# Patient Record
Sex: Female | Born: 1985 | Race: White | Hispanic: No | Marital: Married | Smoking: Never smoker
Health system: Southern US, Community
[De-identification: ages and names within clinical notes are randomized; demographics above are authoritative.]

## PROBLEM LIST (undated history)

## (undated) ENCOUNTER — Inpatient Hospital Stay: Payer: Self-pay

## (undated) DIAGNOSIS — T7840XA Allergy, unspecified, initial encounter: Secondary | ICD-10-CM

## (undated) DIAGNOSIS — N390 Urinary tract infection, site not specified: Secondary | ICD-10-CM

## (undated) DIAGNOSIS — K219 Gastro-esophageal reflux disease without esophagitis: Secondary | ICD-10-CM

## (undated) DIAGNOSIS — D649 Anemia, unspecified: Secondary | ICD-10-CM

## (undated) HISTORY — DX: Gastro-esophageal reflux disease without esophagitis: K21.9

## (undated) HISTORY — DX: Urinary tract infection, site not specified: N39.0

## (undated) HISTORY — DX: Anemia, unspecified: D64.9

## (undated) HISTORY — DX: Allergy, unspecified, initial encounter: T78.40XA

## (undated) HISTORY — PX: COSMETIC SURGERY: SHX468

## (undated) HISTORY — PX: BREAST SURGERY: SHX581

## (undated) HISTORY — PX: WISDOM TOOTH EXTRACTION: SHX21

---
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
## 2015-01-31 LAB — HM PAP SMEAR: HM Pap smear: NEGATIVE

## 2015-11-09 DIAGNOSIS — N390 Urinary tract infection, site not specified: Secondary | ICD-10-CM

## 2015-11-09 HISTORY — DX: Urinary tract infection, site not specified: N39.0

## 2015-11-09 LAB — CBC AND DIFFERENTIAL
HEMATOCRIT: 36 % (ref 36–46)
HEMOGLOBIN: 12.4 g/dL (ref 12.0–16.0)
Platelets: 147 10*3/uL — AB (ref 150–399)
WBC: 9.4 10^3/mL

## 2015-11-16 ENCOUNTER — Ambulatory Visit (INDEPENDENT_AMBULATORY_CARE_PROVIDER_SITE_OTHER): Payer: Managed Care, Other (non HMO) | Admitting: Obstetrics and Gynecology

## 2015-11-16 VITALS — BP 118/75 | HR 91 | Ht 66.0 in | Wt 153.3 lb

## 2015-11-16 DIAGNOSIS — Z369 Encounter for antenatal screening, unspecified: Secondary | ICD-10-CM

## 2015-11-16 DIAGNOSIS — Z113 Encounter for screening for infections with a predominantly sexual mode of transmission: Secondary | ICD-10-CM

## 2015-11-16 DIAGNOSIS — Z36 Encounter for antenatal screening of mother: Secondary | ICD-10-CM

## 2015-11-16 DIAGNOSIS — Z3490 Encounter for supervision of normal pregnancy, unspecified, unspecified trimester: Secondary | ICD-10-CM

## 2015-11-16 DIAGNOSIS — Z1389 Encounter for screening for other disorder: Secondary | ICD-10-CM

## 2015-11-16 DIAGNOSIS — Z331 Pregnant state, incidental: Secondary | ICD-10-CM

## 2015-11-16 LAB — OB RESULTS CONSOLE VARICELLA ZOSTER ANTIBODY, IGG: VARICELLA IGG: IMMUNE

## 2015-11-16 NOTE — Patient Instructions (Signed)
Pregnancy and Zika Virus Disease Zika virus disease, or Zika, is an illness that can spread to people from mosquitoes that carry the virus. It may also spread from person to person through infected body fluids. Zika first occurred in [place], but recently it has spread to new areas. The virus occurs in tropical climates. The location of Zika continues to change. Most people who become infected with Zika virus do not develop serious illness. However, Zika may cause birth defects in an unborn baby whose mother is infected with the virus. It may also increase the risk of miscarriage. WHAT ARE THE SYMPTOMS OF ZIKA VIRUS DISEASE? In many cases, people who have been infected with Zika virus do not develop any symptoms. If symptoms appear, they usually start about a week after the person is infected. Symptoms are usually mild. They may include:  Fever.  Rash.  Red eyes.  Joint pain. HOW DOES ZIKA VIRUS DISEASE SPREAD? The main way that Zika virus spreads is through the bite of a certain type of mosquito. Unlike most types of mosquitos, which bite only at night, the type of mosquito that carries Zika virus bites both at night and during the day. Zika virus can also spread through sexual contact, through a blood transfusion, and from a mother to her baby before or during birth. Once you have had Zika virus disease, it is unlikely that you will get it again. CAN I PASS ZIKA TO MY BABY DURING PREGNANCY? Yes, Zika can pass from a mother to her baby before or during birth. WHAT PROBLEMS CAN ZIKA CAUSE FOR MY BABY? A woman who is infected with Zika virus while pregnant is at risk of having her baby born with a condition in which the brain or head is smaller than expected (microcephaly). Babies who have microcephaly can have developmental delays, seizures, hearing problems, and vision problems. Having Zika virus disease during pregnancy can also increase the risk of miscarriage. HOW CAN ZIKA VIRUS DISEASE BE  PREVENTED? There is no vaccine to prevent Zika. The best way to prevent the disease is to avoid infected mosquitoes and avoid exposure to body fluids that can spread the virus. Avoid any possible exposure to Zika by taking the following precautions. For women and their sex partners:  Avoid traveling to high-risk areas. The locations where Zika is being reported change often. To identify high-risk areas, check the CDC travel website: www.cdc.gov/zika/geo/index.html  If you or your sex partner must travel to a high-risk area, talk with a health care provider before and after traveling.  Take all precautions to avoid mosquito bites if you live in, or travel to, any of the high-risk areas. Insect repellents are safe to use during pregnancy.  Ask your health care provider when it is safe to have sexual contact. For women:  If you are pregnant or trying to become pregnant, avoid sexual contact with persons who may have been exposed to Zika virus, persons who have possible symptoms of Zika, or persons whose history you are unsure about. If you choose to have sexual contact with someone who may have been exposed to Zika virus, use condoms correctly during the entire duration of sexual activity, every time. Do not share sexual devices, as you may be exposed to body fluids.  Ask your health care provider about when it is safe to attempt pregnancy after a possible exposure to Zika virus. WHAT STEPS SHOULD I TAKE TO AVOID MOSQUITO BITES? Take these steps to avoid mosquito bites when you are   in a high-risk area:  Wear loose clothing that covers your arms and legs.  Limit your outdoor activities.  Do not open windows unless they have window screens.  Sleep under mosquito nets.  Use insect repellent. The best insect repellents have:  DEET, picaridin, oil of lemon eucalyptus (OLE), or [id number] in them.  Higher amounts of an active ingredient in them.  Remember that insect repellents are safe to use  during pregnancy.  Do not use OLE on children who are younger than 3 years of age. Do not use insect repellent on babies who are younger than 2 months of age.  Cover your child's stroller with mosquito netting. Make sure the netting fits snugly and that any loose netting does not cover your child's mouth or nose. Do not use a blanket as a mosquito-protection cover.  Do not apply insect repellent underneath clothing.  If you are using sunscreen, apply the sunscreen before applying the insect repellent.  Treat clothing with permethrin. Do not apply permethrin directly to your skin. Follow label directions for safe use.  Get rid of standing water, where mosquitoes may reproduce. Standing water is often found in items such as buckets, bowls, animal food dishes, and flowerpots. When you return from traveling to any high-risk area, continue taking actions to protect yourself against mosquito bites for 3 weeks, even if you show no signs of illness. This will prevent spreading Zika virus to uninfected mosquitoes. WHAT SHOULD I KNOW ABOUT THE SEXUAL TRANSMISSION OF ZIKA? People can spread Zika to their sexual partners during vaginal, anal, or oral sex, or by sharing sexual devices. Many people with Zika do not develop symptoms, so a person could spread the disease without knowing that they are infected. The greatest risk is to women who are pregnant or who may become pregnant. Zika virus can live longer in semen than it can live in blood. Couples can prevent sexual transmission of the virus by:  Using condoms correctly during the entire duration of sexual activity, every time. This includes vaginal, anal, and oral sex.  Not sharing sexual devices. Sharing increases your risk of being exposed to body fluid from another person.  Avoiding all sexual activity until your health care provider says it is safe. SHOULD I BE TESTED FOR ZIKA VIRUS? A sample of your blood can be tested for Zika virus. A pregnant  woman should be tested if she may have been exposed to the virus or if she has symptoms of Zika. She may also have additional tests done during her pregnancy, such ultrasound testing. Talk with your health care provider about which tests are recommended.   This information is not intended to replace advice given to you by your health care provider. Make sure you discuss any questions you have with your health care provider.   Document Released: 01/07/2015 Document Reviewed: 12/31/2014 Elsevier Interactive Patient Education 2016 Elsevier Inc. Minor Illnesses and Medications in Pregnancy  Cold/Flu:  Sudafed for congestion- Robitussin (plain) for cough- Tylenol for discomfort.  Please follow the directions on the label.  Try not to take any more than needed.  OTC Saline nasal spray and air humidifier or cool-mist  Vaporizer to sooth nasal irritation and to loosen congestion.  It is also important to increase intake of non carbonated fluids, especially if you have a fever.  Constipation:  Colace-2 capsules at bedtime; Metamucil- follow directions on label; Senokot- 1 tablet at bedtime.  Any one of these medications can be used.  It is also   very important to increase fluids and fruits along with regular exercise.  If problem persists please call the office.  Diarrhea:  Kaopectate as directed on the label.  Eat a bland diet and increase fluids.  Avoid highly seasoned foods.  Headache:  Tylenol 1 or 2 tablets every 3-4 hours as needed  Indigestion:  Maalox, Mylanta, Tums or Rolaids- as directed on label.  Also try to eat small meals and avoid fatty, greasy or spicy foods.  Nausea with or without Vomiting:  Nausea in pregnancy is caused by increased levels of hormones in the body which influence the digestive system and cause irritation when stomach acids accumulate.  Symptoms usually subside after 1st trimester of pregnancy.  Try the following:  Keep saltines, graham crackers or dry toast by your bed  to eat upon awakening.  Don't let your stomach get empty.  Try to eat 5-6 small meals per day instead of 3 large ones.  Avoid greasy fatty or highly seasoned foods.   Take OTC Unisom 1 tablet at bed time along with OTC Vitamin B6 25-50 mg 3 times per day.    If nausea continues with vomiting and you are unable to keep down food and fluids you may need a prescription medication.  Please notify your provider.   Sore throat:  Chloraseptic spray, throat lozenges and or plain Tylenol.  Vaginal Yeast Infection:  OTC Monistat for 7 days as directed on label.  If symptoms do not resolve within a week notify provider.  If any of the above problems do not subside with recommended treatment please call the office for further assistance.   Do not take Aspirin, Advil, Motrin or Ibuprofen.  * * OTC= Over the counter Hyperemesis Gravidarum Hyperemesis gravidarum is a severe form of nausea and vomiting that happens during pregnancy. Hyperemesis is worse than morning sickness. It may cause you to have nausea or vomiting all day for many days. It may keep you from eating and drinking enough food and liquids. Hyperemesis usually occurs during the first half (the first 20 weeks) of pregnancy. It often goes away once a woman is in her second half of pregnancy. However, sometimes hyperemesis continues through an entire pregnancy.  CAUSES  The cause of this condition is not completely known but is thought to be related to changes in the body's hormones when pregnant. It could be from the high level of the pregnancy hormone or an increase in estrogen in the body.  SIGNS AND SYMPTOMS   Severe nausea and vomiting.  Nausea that does not go away.  Vomiting that does not allow you to keep any food down.  Weight loss and body fluid loss (dehydration).  Having no desire to eat or not liking food you have previously enjoyed. DIAGNOSIS  Your health care provider will do a physical exam and ask you about your symptoms.  He or she may also order blood tests and urine tests to make sure something else is not causing the problem.  TREATMENT  You may only need medicine to control the problem. If medicines do not control the nausea and vomiting, you will be treated in the hospital to prevent dehydration, increased acid in the blood (acidosis), weight loss, and changes in the electrolytes in your body that may harm the unborn baby (fetus). You may need IV fluids.  HOME CARE INSTRUCTIONS   Only take over-the-counter or prescription medicines as directed by your health care provider.  Try eating a couple of dry crackers or   toast in the morning before getting out of bed.  Avoid foods and smells that upset your stomach.  Avoid fatty and spicy foods.  Eat 5-6 small meals a day.  Do not drink when eating meals. Drink between meals.  For snacks, eat high-protein foods, such as cheese.  Eat or suck on things that have ginger in them. Ginger helps nausea.  Avoid food preparation. The smell of food can spoil your appetite.  Avoid iron pills and iron in your multivitamins until after 3-4 months of being pregnant. However, consult with your health care provider before stopping any prescribed iron pills. SEEK MEDICAL CARE IF:   Your abdominal pain increases.  You have a severe headache.  You have vision problems.  You are losing weight. SEEK IMMEDIATE MEDICAL CARE IF:   You are unable to keep fluids down.  You vomit blood.  You have constant nausea and vomiting.  You have excessive weakness.  You have extreme thirst.  You have dizziness or fainting.  You have a fever or persistent symptoms for more than 2-3 days.  You have a fever and your symptoms suddenly get worse. MAKE SURE YOU:   Understand these instructions.  Will watch your condition.  Will get help right away if you are not doing well or get worse.   This information is not intended to replace advice given to you by your health care  provider. Make sure you discuss any questions you have with your health care provider.   Document Released: 04/18/2005 Document Revised: 02/06/2013 Document Reviewed: 11/28/2012 Elsevier Interactive Patient Education 2016 Elsevier Inc. Commonly Asked Questions During Pregnancy  Cats: A parasite can be excreted in cat feces.  To avoid exposure you need to have another person empty the little box.  If you must empty the litter box you will need to wear gloves.  Wash your hands after handling your cat.  This parasite can also be found in raw or undercooked meat so this should also be avoided.  Colds, Sore Throats, Flu: Please check your medication sheet to see what you can take for symptoms.  If your symptoms are unrelieved by these medications please call the office.  Dental Work: Most any dental work your dentist recommends is permitted.  X-rays should only be taken during the first trimester if absolutely necessary.  Your abdomen should be shielded with a lead apron during all x-rays.  Please notify your provider prior to receiving any x-rays.  Novocaine is fine; gas is not recommended.  If your dentist requires a note from us prior to dental work please call the office and we will provide one for you.  Exercise: Exercise is an important part of staying healthy during your pregnancy.  You may continue most exercises you were accustomed to prior to pregnancy.  Later in your pregnancy you will most likely notice you have difficulty with activities requiring balance like riding a bicycle.  It is important that you listen to your body and avoid activities that put you at a higher risk of falling.  Adequate rest and staying well hydrated are a must!  If you have questions about the safety of specific activities ask your provider.    Exposure to Children with illness: Try to avoid obvious exposure; report any symptoms to us when noted,  If you have chicken pos, red measles or mumps, you should be immune to  these diseases.   Please do not take any vaccines while pregnant unless you have checked with   your OB provider.  Fetal Movement: After 28 weeks we recommend you do "kick counts" twice daily.  Lie or sit down in a calm quiet environment and count your baby movements "kicks".  You should feel your baby at least 10 times per hour.  If you have not felt 10 kicks within the first hour get up, walk around and have something sweet to eat or drink then repeat for an additional hour.  If count remains less than 10 per hour notify your provider.  Fumigating: Follow your pest control agent's advice as to how long to stay out of your home.  Ventilate the area well before re-entering.  Hemorrhoids:   Most over-the-counter preparations can be used during pregnancy.  Check your medication to see what is safe to use.  It is important to use a stool softener or fiber in your diet and to drink lots of liquids.  If hemorrhoids seem to be getting worse please call the office.   Hot Tubs:  Hot tubs Jacuzzis and saunas are not recommended while pregnant.  These increase your internal body temperature and should be avoided.  Intercourse:  Sexual intercourse is safe during pregnancy as long as you are comfortable, unless otherwise advised by your provider.  Spotting may occur after intercourse; report any bright red bleeding that is heavier than spotting.  Labor:  If you know that you are in labor, please go to the hospital.  If you are unsure, please call the office and let us help you decide what to do.  Lifting, straining, etc:  If your job requires heavy lifting or straining please check with your provider for any limitations.  Generally, you should not lift items heavier than that you can lift simply with your hands and arms (no back muscles)  Painting:  Paint fumes do not harm your pregnancy, but may make you ill and should be avoided if possible.  Latex or water based paints have less odor than oils.  Use adequate  ventilation while painting.  Permanents & Hair Color:  Chemicals in hair dyes are not recommended as they cause increase hair dryness which can increase hair loss during pregnancy.  " Highlighting" and permanents are allowed.  Dye may be absorbed differently and permanents may not hold as well during pregnancy.  Sunbathing:  Use a sunscreen, as skin burns easily during pregnancy.  Drink plenty of fluids; avoid over heating.  Tanning Beds:  Because their possible side effects are still unknown, tanning beds are not recommended.  Ultrasound Scans:  Routine ultrasounds are performed at approximately 20 weeks.  You will be able to see your baby's general anatomy an if you would like to know the gender this can usually be determined as well.  If it is questionable when you conceived you may also receive an ultrasound early in your pregnancy for dating purposes.  Otherwise ultrasound exams are not routinely performed unless there is a medical necessity.  Although you can request a scan we ask that you pay for it when conducted because insurance does not cover " patient request" scans.  Work: If your pregnancy proceeds without complications you may work until your due date, unless your physician or employer advises otherwise.  Round Ligament Pain/Pelvic Discomfort:  Sharp, shooting pains not associated with bleeding are fairly common, usually occurring in the second trimester of pregnancy.  They tend to be worse when standing up or when you remain standing for long periods of time.  These are the result   of pressure of certain pelvic ligaments called "round ligaments".  Rest, Tylenol and heat seem to be the most effective relief.  As the womb and fetus grow, they rise out of the pelvis and the discomfort improves.  Please notify the office if your pain seems different than that described.  It may represent a more serious condition.   

## 2015-11-16 NOTE — Progress Notes (Signed)
Paige Brown presents for NOB nurse interview visit. G-3.  P-2002.  [id number]. US also done on 11/09/2015 which pt was 6wk, 4days.  LMP: 09/23/2015. Pregnancy education material explained and given. No cats in the home. NOB labs ordered. CBC not ordered due to [facility].  Also pt currently being treated for UTI with macrobid and metrogel vaginal cream for BV. Has 2 more days to complete medication. Pt urine will need to be rechecked on NOB visit with provider. Symptoms are much better per pt.  HIV labs and Drug screen were explained optional and she could opt out of tests but did not decline. Drug screen ordered. PNV encouraged. Genetic testing declined.  Pt. To follow up with provider in 4 weeks for NOB physical.  All questions answered.

## 2015-11-18 LAB — RH TYPE: RH TYPE: POSITIVE

## 2015-11-18 LAB — HEPATITIS B SURFACE ANTIGEN: HEP B S AG: NEGATIVE

## 2015-11-18 LAB — VARICELLA ZOSTER ANTIBODY, IGG: VARICELLA: 576 {index} (ref >165)

## 2015-11-18 LAB — RPR: RPR Ser Ql: NONREACTIVE

## 2015-11-18 LAB — RUBELLA SCREEN: RUBELLA: 16.6 {index} (ref >0.99)

## 2015-11-18 LAB — ANTIBODY SCREEN: Antibody Screen: NEGATIVE

## 2015-11-18 LAB — ABO

## 2015-11-18 LAB — HIV ANTIBODY (ROUTINE TESTING W REFLEX): HIV SCREEN 4TH GENERATION: NONREACTIVE

## 2015-11-19 LAB — MONITOR DRUG PROFILE 14(MW)
AMPHETAMINE SCREEN URINE: NEGATIVE ng/mL
BARBITURATE SCREEN URINE: NEGATIVE ng/mL
BENZODIAZEPINE SCREEN, URINE: NEGATIVE ng/mL
Buprenorphine, Urine: NEGATIVE ng/mL
CANNABINOIDS UR QL SCN: NEGATIVE ng/mL
Cocaine (Metab) Scrn, Ur: NEGATIVE ng/mL
Creatinine(Crt), U: 52.5 mg/dL (ref 20.0–300.0)
FENTANYL, URINE: NEGATIVE pg/mL
MEPERIDINE SCREEN, URINE: NEGATIVE ng/mL
Methadone Screen, Urine: NEGATIVE ng/mL
OXYCODONE+OXYMORPHONE UR QL SCN: NEGATIVE ng/mL
Opiate Scrn, Ur: NEGATIVE ng/mL
PH UR, DRUG SCRN: 7.5 (ref 4.5–8.9)
PROPOXYPHENE SCREEN URINE: NEGATIVE ng/mL
Phencyclidine Qn, Ur: NEGATIVE ng/mL
SPECIFIC GRAVITY: 1.018
Tramadol Screen, Urine: NEGATIVE ng/mL

## 2015-11-19 LAB — NICOTINE SCREEN, URINE: Cotinine Ql Scrn, Ur: NEGATIVE ng/mL

## 2015-11-21 LAB — GC/CHLAMYDIA PROBE AMP
CHLAMYDIA, DNA PROBE: NEGATIVE
Neisseria gonorrhoeae by PCR: NEGATIVE

## 2015-12-17 ENCOUNTER — Encounter: Payer: Managed Care, Other (non HMO) | Admitting: Obstetrics and Gynecology

## 2016-01-01 ENCOUNTER — Encounter: Payer: Managed Care, Other (non HMO) | Admitting: Obstetrics and Gynecology

## 2016-01-05 ENCOUNTER — Encounter: Payer: Self-pay | Admitting: Obstetrics and Gynecology

## 2016-01-05 ENCOUNTER — Ambulatory Visit (INDEPENDENT_AMBULATORY_CARE_PROVIDER_SITE_OTHER): Payer: Managed Care, Other (non HMO) | Admitting: Obstetrics and Gynecology

## 2016-01-05 VITALS — BP 112/73 | HR 105 | Wt 156.2 lb

## 2016-01-05 DIAGNOSIS — Z98891 History of uterine scar from previous surgery: Secondary | ICD-10-CM | POA: Insufficient documentation

## 2016-01-05 DIAGNOSIS — Z8742 Personal history of other diseases of the female genital tract: Secondary | ICD-10-CM

## 2016-01-05 DIAGNOSIS — O219 Vomiting of pregnancy, unspecified: Secondary | ICD-10-CM

## 2016-01-05 DIAGNOSIS — Z3492 Encounter for supervision of normal pregnancy, unspecified, second trimester: Secondary | ICD-10-CM

## 2016-01-05 LAB — POCT URINALYSIS DIPSTICK
Bilirubin, UA: NEGATIVE
Glucose, UA: NEGATIVE
Ketones, UA: 15
Leukocytes, UA: NEGATIVE
Nitrite, UA: NEGATIVE
PROTEIN UA: NEGATIVE
RBC UA: NEGATIVE
SPEC GRAV UA: 1.01
UROBILINOGEN UA: 0.2
pH, UA: 7

## 2016-01-05 NOTE — Progress Notes (Signed)
NOB- pt has nausea, and heartburn, is taking zofran and protonix

## 2016-01-05 NOTE — Patient Instructions (Signed)

## 2016-01-05 NOTE — Progress Notes (Signed)
NEW OB HISTORY AND PHYSICAL  SUBJECTIVE:       Paige Brown is a 30 y.o. G45P2002 female, Patient's last menstrual period was 09/23/2015 (exact date)., Estimated Date of Delivery: 06/29/16, [redacted]w[redacted]d presents today for establishment of Prenatal Care. She has no unusual complaints and complains of daily nausea and vomiting- OK to try diclegis, zofran helps some; works FT in ED.      Gynecologic History Patient's last menstrual period was 09/23/2015 (exact date). Normal Contraception: none Last Pap: 2016. Results were: normal  Obstetric History OB History  Gravida Para Term Preterm AB Living  _0 SAB TAB Ectopic Multiple Live Births          2    # Outcome Date GA Lbr Len/2nd Weight Sex Delivery Anes PTL Lv  3 Current           2 Term 09/11/14 37w1d8 lb 5 oz (3.771 kg) M   Y LIV  1 Term 10/14/11 3938w1d lb 10 oz (3.459 kg) F CS-Unspec  N LIV    Obstetric Comments  1st pregnancy-breech  2nd pregnancy: PTL at 35 wks./ VBAC- no assistance    Past Medical History:  Diagnosis Date  . Anemia    with pregnancies  . GERD (gastroesophageal reflux disease)    during pregnancy  . UTI (lower urinary tract infection) 11/09/2015   2 more days to complete ATB    Past Surgical History:  Procedure Laterality Date  . CESAREAN SECTION    . TONSILLECTOMY  1994   and adnoids  . WISDOM TOOTH EXTRACTION     2000    Current Outpatient Prescriptions on File Prior to Visit  Medication Sig Dispense Refill  . Prenatal Vit-Fe Fumarate-FA (PRENATAL VITAMINS) 28-0.8 MG TABS Take by mouth.     No current facility-administered medications on file prior to visit.     Allergies  Allergen Reactions  . Ciprofloxacin Nausea And Vomiting  . Penicillins Hives  . Shellfish Allergy Swelling  . Cefaclor Hives    Social History   Social History  . Marital status: Married    Spouse name: N/A  . Number of children: N/A  . Years of education: N/A   Occupational History  . RN [employer]    [employer]   Social History Main Topics  . Smoking status: Never Smoker  . Smokeless tobacco: Never Used  . Alcohol use 0.0 oz/week     Comment: on occasionally when not breast feeding or pregnancy  . Drug use: No  . Sexual activity: Yes    Partners: Male   Other Topics Concern  . Not on file   Social History Narrative  . No narrative on file    Family History  Problem Relation Age of Onset  . Rheum arthritis Maternal Grandmother   . Osteoarthritis Maternal Grandmother   . Rheum arthritis Maternal Aunt   . Rheum arthritis Maternal Uncle   . Asthma Paternal Grandmother   . Cancer Father     multiple Myeloma  . Breast cancer Maternal Grandmother 60  . Heart failure Maternal Grandfather   . Hyperlipidemia Maternal Grandfather   . Hypertension Maternal Grandfather   . Stroke Paternal Grandfather   . Thyroid disease Maternal Grandmother     The following portions of the patient's history were reviewed and updated as appropriate: allergies, current medications, past OB history, past medical history, past surgical history, past family history, past social history,  and problem list.    OBJECTIVE: Initial Physical Exam (New OB)  GENERAL APPEARANCE: alert, well appearing, in no apparent distress, oriented to person, place and time HEAD: normocephalic, atraumatic MOUTH: mucous membranes moist, pharynx normal without lesions and dental hygiene good THYROID: no thyromegaly or masses present BREASTS: not examined LUNGS: clear to auscultation, no wheezes, rales or rhonchi, symmetric air entry HEART: regular rate and rhythm, no murmurs ABDOMEN: soft, nontender, nondistended, no abnormal masses, no epigastric pain and FHT present EXTREMITIES: no redness or tenderness in the calves or thighs SKIN: normal coloration and turgor, no rashes LYMPH NODES: no adenopathy palpable NEUROLOGIC: alert, oriented, normal speech, no focal findings or movement disorder noted  PELVIC EXAM not  indi  ASSESSMENT: Normal pregnancy Nausea and vomiting Previous c/s followed by successful VBAC  PLAN: Prenatal care Declines genetic screening See orders                     

## 2016-01-08 ENCOUNTER — Other Ambulatory Visit: Payer: Self-pay | Admitting: *Deleted

## 2016-01-08 ENCOUNTER — Telehealth: Payer: Self-pay

## 2016-01-08 MED ORDER — DOXYLAMINE-PYRIDOXINE 10-10 MG PO TBEC: 2.0000 | DELAYED_RELEASE_TABLET | Freq: Every day | ORAL | 4 refills | Status: DC

## 2016-01-08 NOTE — Telephone Encounter (Signed)
Pt calls and states that she was given a sample of Diclegis at her appt on Tuesday and it has worked well, pt requests RX be sent [facility] to [facility].

## 2016-01-08 NOTE — Telephone Encounter (Signed)
Done-ac 

## 2016-02-05 ENCOUNTER — Other Ambulatory Visit: Payer: Self-pay | Admitting: Obstetrics and Gynecology

## 2016-02-05 DIAGNOSIS — Z3492 Encounter for supervision of normal pregnancy, unspecified, second trimester: Secondary | ICD-10-CM

## 2016-02-09 ENCOUNTER — Ambulatory Visit (INDEPENDENT_AMBULATORY_CARE_PROVIDER_SITE_OTHER): Payer: 59 | Admitting: Obstetrics and Gynecology

## 2016-02-09 ENCOUNTER — Ambulatory Visit (INDEPENDENT_AMBULATORY_CARE_PROVIDER_SITE_OTHER): Payer: 59

## 2016-02-09 VITALS — BP 110/68 | HR 88 | Wt 162.4 lb

## 2016-02-09 DIAGNOSIS — Z3492 Encounter for supervision of normal pregnancy, unspecified, second trimester: Secondary | ICD-10-CM

## 2016-02-09 LAB — POCT URINALYSIS DIPSTICK
BILIRUBIN UA: NEGATIVE
Blood, UA: NEGATIVE
Glucose, UA: NEGATIVE
KETONES UA: NEGATIVE
LEUKOCYTES UA: NEGATIVE
Nitrite, UA: NEGATIVE
Protein, UA: NEGATIVE
Spec Grav, UA: 1.015
Urobilinogen, UA: 0.2
pH, UA: 6

## 2016-02-09 NOTE — Progress Notes (Signed)
ROB-flu vaccine given at work, disclegis is helping. Indications:Anatomy U/S Findings:  Singleton intrauterine pregnancy is visualized with FHR at 141 BPM. Biometrics give an (U/S) Gestational age of 29 4/7 weeks and an (U/S) EDD of 06/24/16; this correlates with the clinically established EDD of 06/29/16.  Fetal presentation is Variable.  EFW: 361g (13oz). Placenta: Anterior, grade 0, 4 cm from internal os. AFI: Adequate with MVP of 4.4 cm.  Anatomic survey is incomplete and normal; Gender - female  .  Profile is not visualized due to fetal position.  Ovaries are not visualized Survey of the adnexa demonstrates no adnexal masses. There is no free peritoneal fluid in the cul de sac.  Impression: 1. 20 4/7 week Viable Singleton Intrauterine pregnancy by U/S. 2. (U/S) EDD is consistent with Clinically established (LMP) EDD of 06/29/16. 3. Normal Anatomy Scan

## 2016-02-09 NOTE — Progress Notes (Signed)
ROB- anatomy scan done today, pt is feeling well

## 2016-02-23 ENCOUNTER — Telehealth: Payer: Self-pay | Admitting: Obstetrics and Gynecology

## 2016-02-23 NOTE — Telephone Encounter (Signed)
DICLEGIS IS NO LONGER WORKING FOR [employer] AND SHE IS THROWING UP AT LEAST TWICE A DAY. CAN YOU SENT ZOFRAN TO EMPLOYEE PHARMACY. PLEASE

## 2016-02-24 ENCOUNTER — Other Ambulatory Visit: Payer: Self-pay | Admitting: *Deleted

## 2016-02-24 MED ORDER — ONDANSETRON 8 MG PO TBDP: 8.0000 mg | ORAL_TABLET | Freq: Three times a day (TID) | ORAL | 2 refills | Status: DC | PRN

## 2016-02-24 NOTE — Telephone Encounter (Signed)
Done-ac 

## 2016-03-07 ENCOUNTER — Other Ambulatory Visit: Payer: Self-pay | Admitting: Obstetrics and Gynecology

## 2016-03-07 DIAGNOSIS — IMO0002 Reserved for concepts with insufficient information to code with codable children: Secondary | ICD-10-CM

## 2016-03-07 DIAGNOSIS — Z0489 Encounter for examination and observation for other specified reasons: Secondary | ICD-10-CM

## 2016-03-11 ENCOUNTER — Ambulatory Visit (INDEPENDENT_AMBULATORY_CARE_PROVIDER_SITE_OTHER): Payer: 59 | Admitting: Obstetrics and Gynecology

## 2016-03-11 ENCOUNTER — Ambulatory Visit (INDEPENDENT_AMBULATORY_CARE_PROVIDER_SITE_OTHER): Payer: 59

## 2016-03-11 VITALS — BP 116/75 | HR 103 | Wt 166.2 lb

## 2016-03-11 DIAGNOSIS — Z362 Encounter for other antenatal screening follow-up: Secondary | ICD-10-CM | POA: Diagnosis not present

## 2016-03-11 DIAGNOSIS — Z0489 Encounter for examination and observation for other specified reasons: Secondary | ICD-10-CM

## 2016-03-11 DIAGNOSIS — Z3492 Encounter for supervision of normal pregnancy, unspecified, second trimester: Secondary | ICD-10-CM

## 2016-03-11 DIAGNOSIS — IMO0002 Reserved for concepts with insufficient information to code with codable children: Secondary | ICD-10-CM

## 2016-03-11 LAB — POCT URINALYSIS DIPSTICK
Bilirubin, UA: NEGATIVE
GLUCOSE UA: NEGATIVE
KETONES UA: 5
Leukocytes, UA: NEGATIVE
Nitrite, UA: NEGATIVE
Protein, UA: NEGATIVE
RBC UA: NEGATIVE
UROBILINOGEN UA: 0.2
pH, UA: 7.5

## 2016-03-11 NOTE — Progress Notes (Signed)
ROB- follow up anatomy scan today, had UTI this weekend, otherwise is feeling good

## 2016-03-11 NOTE — Progress Notes (Signed)
ROB & follow up anatomy scan- finished 3 day script for macrobid, glucola next visit

## 2016-04-08 ENCOUNTER — Telehealth: Payer: Self-pay | Admitting: Obstetrics and Gynecology

## 2016-04-08 ENCOUNTER — Ambulatory Visit (INDEPENDENT_AMBULATORY_CARE_PROVIDER_SITE_OTHER): Payer: 59 | Admitting: Obstetrics and Gynecology

## 2016-04-08 VITALS — BP 114/76 | HR 111 | Wt 167.3 lb

## 2016-04-08 DIAGNOSIS — O26819 Pregnancy related exhaustion and fatigue, unspecified trimester: Secondary | ICD-10-CM

## 2016-04-08 DIAGNOSIS — Z3493 Encounter for supervision of normal pregnancy, unspecified, third trimester: Secondary | ICD-10-CM | POA: Diagnosis not present

## 2016-04-08 LAB — POCT URINALYSIS DIPSTICK
Blood, UA: NEGATIVE
GLUCOSE UA: NEGATIVE
Ketones, UA: NEGATIVE
LEUKOCYTES UA: NEGATIVE
NITRITE UA: NEGATIVE
PH UA: 7
Spec Grav, UA: 1.01
Urobilinogen, UA: 0.2

## 2016-04-08 NOTE — Patient Instructions (Signed)
Fatigue Introduction Fatigue is feeling tired all of the time, a lack of energy, or a lack of motivation. Occasional or mild fatigue is often a normal response to activity or life in general. However, long-lasting (chronic) or extreme fatigue may indicate an underlying medical condition. Follow these instructions at home: Watch your fatigue for any changes. The following actions may help to lessen any discomfort you are feeling:  Talk to your health care provider about how much sleep you need each night. Try to get the required amount every night.  Take medicines only as directed by your health care provider.  Eat a healthy and nutritious diet. Ask your health care provider if you need help changing your diet.  Drink enough fluid to keep your urine clear or pale yellow.  Practice ways of relaxing, such as yoga, meditation, massage therapy, or acupuncture.  Exercise regularly.  Change situations that cause you stress. Try to keep your work and personal routine reasonable.  Do not abuse illegal drugs.  Limit alcohol intake to no more than 1 drink per day for nonpregnant women and 2 drinks per day for men. One drink equals 12 ounces of beer, 5 ounces of wine, or 1 ounces of hard liquor.  Take a multivitamin, if directed by your health care provider. Contact a health care provider if:  Your fatigue does not get better.  You have a fever.  You have unintentional weight loss or gain.  You have headaches.  You have difficulty:  Falling asleep.  Sleeping throughout the night.  You feel angry, guilty, anxious, or sad.  You are unable to have a bowel movement (constipation).  You skin is dry.  Your legs or another part of your body is swollen. Get help right away if:  You feel confused.  Your vision is blurry.  You feel faint or pass out.  You have a severe headache.  You have severe abdominal, pelvic, or back pain.  You have chest pain, shortness of breath, or an  irregular or fast heartbeat.  You are unable to urinate or you urinate less than normal.  You develop abnormal bleeding, such as bleeding from the rectum, vagina, nose, lungs, or nipples.  You vomit blood.  You have thoughts about harming yourself or committing suicide.  You are worried that you might harm someone else. This information is not intended to replace advice given to you by your health care provider. Make sure you discuss any questions you have with your health care provider. Document Released: 02/13/2007 Document Revised: 09/24/2015 Document Reviewed: 08/20/2013  2017 Elsevier  

## 2016-04-08 NOTE — Telephone Encounter (Signed)
Pt called and she is [redacted] weeks pregnant she does have her routine visit on Tuesday 12/12, but she is just not feeling well at all, she is very weak, and tired, she stated her acid reflux has gotten worse, and she stated that she has had pregnancy induced anemia in the past. She wanted to be seen today, I talked to Amy and she wanted me to send message to you.

## 2016-04-08 NOTE — Progress Notes (Signed)
OB WORK IN- pt states for the last several weeks she has been exhausted, no energy

## 2016-04-09 LAB — COMPREHENSIVE METABOLIC PANEL
A/G RATIO: 1.5 (ref 1.2–2.2)
ALK PHOS: 73 IU/L (ref 39–117)
ALT: 9 IU/L (ref 0–32)
AST: 9 IU/L (ref 0–40)
Albumin: 3.7 g/dL (ref 3.5–5.5)
BUN/Creatinine Ratio: 10 (ref 9–23)
BUN: 5 mg/dL — ABNORMAL LOW (ref 6–20)
Bilirubin Total: 0.5 mg/dL (ref 0.0–1.2)
CHLORIDE: 101 mmol/L (ref 96–106)
CO2: 22 mmol/L (ref 18–29)
Calcium: 8 mg/dL — ABNORMAL LOW (ref 8.7–10.2)
Creatinine, Ser: 0.51 mg/dL — ABNORMAL LOW (ref 0.57–1.00)
GFR calc Af Amer: 150 mL/min/{1.73_m2} (ref >59)
GFR calc non Af Amer: 130 mL/min/{1.73_m2} (ref >59)
GLOBULIN, TOTAL: 2.4 g/dL (ref 1.5–4.5)
Glucose: 71 mg/dL (ref 65–99)
POTASSIUM: 3.6 mmol/L (ref 3.5–5.2)
SODIUM: 137 mmol/L (ref 134–144)
Total Protein: 6.1 g/dL (ref 6.0–8.5)

## 2016-04-09 LAB — THYROID PANEL WITH TSH
Free Thyroxine Index: 1.6 (ref 1.2–4.9)
T3 UPTAKE RATIO: 19 % — AB (ref 24–39)
T4 TOTAL: 8.5 ug/dL (ref 4.5–12.0)
TSH: 1.07 u[IU]/mL (ref 0.450–4.500)

## 2016-04-09 LAB — CBC
HEMATOCRIT: 37.4 % (ref 34.0–46.6)
Hemoglobin: 13.2 g/dL (ref 11.1–15.9)
MCH: 32.6 pg (ref 26.6–33.0)
MCHC: 35.3 g/dL (ref 31.5–35.7)
MCV: 92 fL (ref 79–97)
PLATELETS: 140 10*3/uL — AB (ref 150–379)
RBC: 4.05 x10E6/uL (ref 3.77–5.28)
RDW: 13.4 % (ref 12.3–15.4)
WBC: 6.5 10*3/uL (ref 3.4–10.8)

## 2016-04-09 LAB — VITAMIN D 25 HYDROXY (VIT D DEFICIENCY, FRACTURES): VIT D 25 HYDROXY: 22.9 ng/mL — AB (ref 30.0–100.0)

## 2016-04-09 LAB — IRON: IRON: 168 ug/dL — AB (ref 27–159)

## 2016-04-09 LAB — VITAMIN B12: Vitamin B-12: 336 pg/mL (ref 232–1245)

## 2016-04-12 ENCOUNTER — Other Ambulatory Visit: Payer: 59

## 2016-04-12 ENCOUNTER — Ambulatory Visit (INDEPENDENT_AMBULATORY_CARE_PROVIDER_SITE_OTHER): Payer: 59 | Admitting: Obstetrics and Gynecology

## 2016-04-12 ENCOUNTER — Other Ambulatory Visit: Payer: Self-pay | Admitting: Obstetrics and Gynecology

## 2016-04-12 VITALS — BP 119/79 | HR 92 | Wt 171.0 lb

## 2016-04-12 DIAGNOSIS — D696 Thrombocytopenia, unspecified: Secondary | ICD-10-CM

## 2016-04-12 DIAGNOSIS — Z3493 Encounter for supervision of normal pregnancy, unspecified, third trimester: Secondary | ICD-10-CM | POA: Diagnosis not present

## 2016-04-12 DIAGNOSIS — Z23 Encounter for immunization: Secondary | ICD-10-CM

## 2016-04-12 DIAGNOSIS — Z131 Encounter for screening for diabetes mellitus: Secondary | ICD-10-CM | POA: Diagnosis not present

## 2016-04-12 DIAGNOSIS — E559 Vitamin D deficiency, unspecified: Secondary | ICD-10-CM

## 2016-04-12 LAB — POCT URINALYSIS DIPSTICK
Bilirubin, UA: NEGATIVE
Glucose, UA: NEGATIVE
KETONES UA: NEGATIVE
Leukocytes, UA: NEGATIVE
Nitrite, UA: NEGATIVE
PH UA: 6
PROTEIN UA: NEGATIVE
RBC UA: NEGATIVE
SPEC GRAV UA: 1.015
Urobilinogen, UA: 0.2

## 2016-04-12 MED ORDER — TETANUS-DIPHTH-ACELL PERTUSSIS 5-2.5-18.5 LF-MCG/0.5 IM SUSP
0.5000 mL | Freq: Once | INTRAMUSCULAR | Status: AC
  Administered 2016-04-12: 0.5 mL via INTRAMUSCULAR

## 2016-04-12 MED ORDER — VITAMIN D (ERGOCALCIFEROL) 1.25 MG (50000 UNIT) PO CAPS: 50000.0000 [IU] | ORAL_CAPSULE | ORAL | 1 refills | Status: DC

## 2016-04-12 MED ORDER — PANTOPRAZOLE SODIUM 40 MG PO TBEC: 40.0000 mg | DELAYED_RELEASE_TABLET | Freq: Every day | ORAL | 3 refills | Status: DC

## 2016-04-12 NOTE — Progress Notes (Signed)
ROB- blood consent signed, glucola done, tdap given, pt is still very tired

## 2016-04-12 NOTE — Progress Notes (Signed)
ROB- Overall doing well but does continue to feel tired. 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 Will send in a prescription for Vitamin D to start. Follow up in 2-3 week for ROB.

## 2016-04-13 LAB — HEMOGLOBIN AND HEMATOCRIT, BLOOD
HEMATOCRIT: 34.1 % (ref 34.0–46.6)
HEMOGLOBIN: 11.9 g/dL (ref 11.1–15.9)

## 2016-04-13 LAB — GLUCOSE, 1 HOUR GESTATIONAL: GESTATIONAL DIABETES SCREEN: 59 mg/dL — AB (ref 65–139)

## 2016-05-02 NOTE — L&D Delivery Note (Signed)
Delivery Note successful VBAC At  1726 a viable and healthy female "Paige Brown" was delivered via  (Presentation: ;  ).  APGAR: 8, 9  .   Placenta status: delivered intact with 3 vessel  Cord:  with the following complications: [place] x1 infant delivered through easily  Anesthesia:  none Episiotomy:  none Lacerations:  1st degree Suture Repair: 3.0 vicryl rapide Est. Blood Loss (mL):  200  Mom to postpartum.  Baby to Couplet care / Skin to Skin.  Melody N Shambley 06/11/2016, 5:45 PM

## 2016-05-03 ENCOUNTER — Ambulatory Visit (INDEPENDENT_AMBULATORY_CARE_PROVIDER_SITE_OTHER): Payer: 59 | Admitting: Obstetrics and Gynecology

## 2016-05-03 VITALS — BP 117/81 | HR 102 | Wt 172.7 lb

## 2016-05-03 DIAGNOSIS — Z3493 Encounter for supervision of normal pregnancy, unspecified, third trimester: Secondary | ICD-10-CM

## 2016-05-03 LAB — POCT URINALYSIS DIPSTICK
Bilirubin, UA: NEGATIVE
Blood, UA: 6.5
Glucose, UA: NEGATIVE
KETONES UA: 5
LEUKOCYTES UA: NEGATIVE
Nitrite, UA: NEGATIVE
PH UA: 6.5
PROTEIN UA: NEGATIVE
SPEC GRAV UA: 1.01
Urobilinogen, UA: 0.2

## 2016-05-03 NOTE — Progress Notes (Signed)
ROB- doing well except fatigue. Just moved. Has vulvar varicosities. Plans vasectomy.

## 2016-05-03 NOTE — Progress Notes (Signed)
ROB- pt is doing well, is feeling some better

## 2016-05-18 ENCOUNTER — Encounter: Payer: 59 | Admitting: Obstetrics and Gynecology

## 2016-05-20 ENCOUNTER — Ambulatory Visit (INDEPENDENT_AMBULATORY_CARE_PROVIDER_SITE_OTHER): Payer: 59 | Admitting: Certified Nurse Midwife

## 2016-05-20 VITALS — BP 120/82 | HR 86 | Wt 175.0 lb

## 2016-05-20 DIAGNOSIS — Z3483 Encounter for supervision of other normal pregnancy, third trimester: Secondary | ICD-10-CM

## 2016-05-20 LAB — POCT URINALYSIS DIPSTICK
BILIRUBIN UA: NEGATIVE
Blood, UA: NEGATIVE
GLUCOSE UA: NEGATIVE
KETONES UA: NEGATIVE
NITRITE UA: NEGATIVE
Protein, UA: NEGATIVE
Spec Grav, UA: 1.01
Urobilinogen, UA: NEGATIVE
pH, UA: 8

## 2016-05-20 NOTE — Progress Notes (Signed)
ROB-Pt doing well. Reports BHC on Wednesday at work, resolved with rest and water. RTC x 2 weeks to meet Dr. Valentino Saxonherry due to previous c-section. Reviewed red flag symptoms and when to call.

## 2016-05-20 NOTE — Patient Instructions (Signed)
Braxton Hicks Contractions °Contractions of the uterus can occur throughout pregnancy. Contractions are not always a sign that you are in labor.  °WHAT ARE BRAXTON HICKS CONTRACTIONS?  °Contractions that occur before labor are called Braxton Hicks contractions, or false labor. Toward the end of pregnancy (32-34 weeks), these contractions can develop more often and may become more forceful. This is not true labor because these contractions do not result in opening (dilatation) and thinning of the cervix. They are sometimes difficult to tell apart from true labor because these contractions can be forceful and people have different pain tolerances. You should not feel embarrassed if you go to the hospital with false labor. Sometimes, the only way to tell if you are in true labor is for your health care provider to look for changes in the cervix. °If there are no prenatal problems or other health problems associated with the pregnancy, it is completely safe to be sent home with false labor and await the onset of true labor. °HOW CAN YOU TELL THE DIFFERENCE BETWEEN TRUE AND FALSE LABOR? °False Labor  °· The contractions of false labor are usually shorter and not as hard as those of true labor.   °· The contractions are usually irregular.   °· The contractions are often felt in the front of the lower abdomen and in the groin.   °· The contractions may go away when you walk around or change positions while lying down.   °· The contractions get weaker and are shorter lasting as time goes on.   °· The contractions do not usually become progressively stronger, regular, and closer together as with true labor.   °True Labor  °· Contractions in true labor last 30-70 seconds, become very regular, usually become more intense, and increase in frequency.   °· The contractions do not go away with walking.   °· The discomfort is usually felt in the top of the uterus and spreads to the lower abdomen and low back.   °· True labor can be  determined by your health care provider with an exam. This will show that the cervix is dilating and getting thinner.   °WHAT TO REMEMBER °· Keep up with your usual exercises and follow other instructions given by your health care provider.   °· Take medicines as directed by your health care provider.   °· Keep your regular prenatal appointments.   °· Eat and drink lightly if you think you are going into labor.   °· If Braxton Hicks contractions are making you uncomfortable:   °¨ Change your position from lying down or resting to walking, or from walking to resting.   °¨ Sit and rest in a tub of warm water.   °¨ Drink 2-3 glasses of water. Dehydration may cause these contractions.   °¨ Do slow and deep breathing several times an hour.   °WHEN SHOULD I SEEK IMMEDIATE MEDICAL CARE? °Seek immediate medical care if: °· Your contractions become stronger, more regular, and closer together.   °· You have fluid leaking or gushing from your vagina.   °· You have a fever.   °· You pass blood-tinged mucus.   °· You have vaginal bleeding.   °· You have continuous abdominal pain.   °· You have low back pain that you never had before.   °· You feel your baby's head pushing down and causing pelvic pressure.   °· Your baby is not moving as much as it used to.   °This information is not intended to replace advice given to you by your health care provider. Make sure you discuss any questions you have with your health care   provider. °Document Released: 04/18/2005 Document Revised: 08/10/2015 Document Reviewed: 01/28/2013 °Elsevier Interactive Patient Education © 2017 Elsevier Inc. ° °

## 2016-05-31 ENCOUNTER — Encounter: Payer: 59 | Admitting: Obstetrics and Gynecology

## 2016-06-01 ENCOUNTER — Ambulatory Visit (INDEPENDENT_AMBULATORY_CARE_PROVIDER_SITE_OTHER): Payer: 59 | Admitting: Obstetrics and Gynecology

## 2016-06-01 ENCOUNTER — Encounter: Payer: 59 | Admitting: Obstetrics and Gynecology

## 2016-06-01 ENCOUNTER — Encounter: Payer: Self-pay | Admitting: Obstetrics and Gynecology

## 2016-06-01 VITALS — BP 127/80 | HR 91 | Wt 180.0 lb

## 2016-06-01 DIAGNOSIS — Z3493 Encounter for supervision of normal pregnancy, unspecified, third trimester: Secondary | ICD-10-CM | POA: Diagnosis not present

## 2016-06-01 LAB — POCT URINALYSIS DIPSTICK
BILIRUBIN UA: NEGATIVE
Glucose, UA: NEGATIVE
KETONES UA: NEGATIVE
Leukocytes, UA: NEGATIVE
Nitrite, UA: NEGATIVE
PH UA: 5
Protein, UA: NEGATIVE
RBC UA: NEGATIVE
SPEC GRAV UA: 1.02
Urobilinogen, UA: NEGATIVE

## 2016-06-01 LAB — OB RESULTS CONSOLE GBS: GBS: POSITIVE

## 2016-06-01 NOTE — Progress Notes (Signed)
Pt is a 36 week prenatal to discuss VBAC.

## 2016-06-01 NOTE — Progress Notes (Signed)
Patient had a previous cesarean delivery for breech followed by a vaginal birth. She would like to VBAC again if possible. VBAC I have discussed the possibility of Vaginal Birth After Cesarean with the patient.  She has been made aware that this procedure is becoming less and less common because if its attendant risks and the increased understanding of these risks that we have gained over the last several years.  Should she choose VBAC, she does have a slightly increased risk of uterine rupture or other pregnancy complication resulting from a dysfunctional uterus.  She is aware that fetal death may result if uterine rupture and expulsion of the fetus occurs.  We have discussed the fact that this is a very small risk (approximately 2 percent) and that even if the uterus ruptures, few babies are lost.  She has been informed that should she choose VBAC, a Cesarean delivery for other indications may still be necessary.  The advantages of VBAC and of repeat Cesarean delivery have been discussed.  All of her questions have been answered and I believe she has an informed understanding of both repeat Cesarean delivery and Vaginal Birth After Cesarean. She has reaffirmed her desire for VBAC.  GC/CT performed today - GBS

## 2016-06-03 ENCOUNTER — Encounter: Payer: 59 | Admitting: Obstetrics and Gynecology

## 2016-06-03 LAB — GC/CHLAMYDIA PROBE AMP
Chlamydia trachomatis, NAA: NEGATIVE
Neisseria gonorrhoeae by PCR: NEGATIVE

## 2016-06-06 ENCOUNTER — Telehealth: Payer: Self-pay

## 2016-06-06 LAB — STREP GP B SUSCEPTIBILITY

## 2016-06-06 LAB — STREP GP B CULTURE+RFLX: STREP GP B CULTURE+RFLX: POSITIVE — AB

## 2016-06-06 NOTE — Telephone Encounter (Signed)
-----   Message from Linzie Collinavid James Evans, MD sent at 06/06/2016 10:48 AM EST ----- Please note these results in the patient's chart.

## 2016-06-06 NOTE — Telephone Encounter (Signed)
Attempted to contact pt- phone is out of service at this time.

## 2016-06-07 ENCOUNTER — Ambulatory Visit (INDEPENDENT_AMBULATORY_CARE_PROVIDER_SITE_OTHER): Payer: 59 | Admitting: Obstetrics and Gynecology

## 2016-06-07 VITALS — BP 146/85 | HR 91 | Wt 182.5 lb

## 2016-06-07 DIAGNOSIS — Z3493 Encounter for supervision of normal pregnancy, unspecified, third trimester: Secondary | ICD-10-CM

## 2016-06-07 LAB — POCT URINALYSIS DIPSTICK
Bilirubin, UA: NEGATIVE
Blood, UA: NEGATIVE
Glucose, UA: NEGATIVE
Ketones, UA: NEGATIVE
Leukocytes, UA: NEGATIVE
NITRITE UA: NEGATIVE
PH UA: 6
PROTEIN UA: NEGATIVE
Spec Grav, UA: <=1.005
UROBILINOGEN UA: 0.2

## 2016-06-07 NOTE — Telephone Encounter (Signed)
Message left on patients answering machine re: positive GBS. Encouraged patient to call office with any questions.

## 2016-06-07 NOTE — Progress Notes (Signed)
GBS + discussed and labor precaution discussed.

## 2016-06-07 NOTE — Progress Notes (Signed)
ROB- pt has been having contractions all night, lots of pelvic pressure

## 2016-06-10 ENCOUNTER — Ambulatory Visit (INDEPENDENT_AMBULATORY_CARE_PROVIDER_SITE_OTHER): Payer: 59 | Admitting: Obstetrics and Gynecology

## 2016-06-10 VITALS — BP 133/92 | HR 101 | Wt 182.3 lb

## 2016-06-10 DIAGNOSIS — Z3493 Encounter for supervision of normal pregnancy, unspecified, third trimester: Secondary | ICD-10-CM | POA: Diagnosis not present

## 2016-06-10 LAB — POCT URINALYSIS DIPSTICK
Bilirubin, UA: NEGATIVE
Blood, UA: NEGATIVE
Glucose, UA: NEGATIVE
KETONES UA: NEGATIVE
LEUKOCYTES UA: NEGATIVE
NITRITE UA: NEGATIVE
PH UA: 5
PROTEIN UA: NEGATIVE
Spec Grav, UA: 1.01
UROBILINOGEN UA: NEGATIVE

## 2016-06-10 NOTE — Progress Notes (Signed)
NST performed today was reviewed and was found to be reactive. Baseline135 with Moderate variability; No decels noted.irregular contractions noted. Mild to palpation. Labor precautions discussed  Continue recommended antenatal testing and prenatal care.

## 2016-06-10 NOTE — Progress Notes (Signed)
Pt is here with c/o "lots" of contractions. C/o clearish discharge. C/o back pain and pelvic pain

## 2016-06-10 NOTE — Progress Notes (Signed)
ROB-came in for labor check, negative sterile speculum exam, Fern & NTZ. States regular contractions all night while at work pain in back.

## 2016-06-11 ENCOUNTER — Inpatient Hospital Stay
Admission: EM | Admit: 2016-06-11 | Discharge: 2016-06-13 | DRG: 775 | Disposition: A | Discharge status: Home / Self Care | Payer: 59 | Attending: Obstetrics and Gynecology | Admitting: Obstetrics and Gynecology

## 2016-06-11 ENCOUNTER — Encounter: Payer: Self-pay | Admitting: *Deleted

## 2016-06-11 DIAGNOSIS — Z98891 History of uterine scar from previous surgery: Secondary | ICD-10-CM

## 2016-06-11 DIAGNOSIS — O9962 Diseases of the digestive system complicating childbirth: Secondary | ICD-10-CM | POA: Diagnosis present

## 2016-06-11 DIAGNOSIS — Z3493 Encounter for supervision of normal pregnancy, unspecified, third trimester: Secondary | ICD-10-CM | POA: Diagnosis not present

## 2016-06-11 DIAGNOSIS — D696 Thrombocytopenia, unspecified: Secondary | ICD-10-CM | POA: Diagnosis present

## 2016-06-11 DIAGNOSIS — K219 Gastro-esophageal reflux disease without esophagitis: Secondary | ICD-10-CM | POA: Diagnosis present

## 2016-06-11 DIAGNOSIS — Z823 Family history of stroke: Secondary | ICD-10-CM | POA: Diagnosis not present

## 2016-06-11 DIAGNOSIS — Z8249 Family history of ischemic heart disease and other diseases of the circulatory system: Secondary | ICD-10-CM

## 2016-06-11 DIAGNOSIS — O34211 Maternal care for low transverse scar from previous cesarean delivery: Secondary | ICD-10-CM | POA: Diagnosis present

## 2016-06-11 DIAGNOSIS — O99824 Streptococcus B carrier state complicating childbirth: Secondary | ICD-10-CM | POA: Diagnosis present

## 2016-06-11 DIAGNOSIS — O9912 Other diseases of the blood and blood-forming organs and certain disorders involving the immune mechanism complicating childbirth: Secondary | ICD-10-CM | POA: Diagnosis present

## 2016-06-11 DIAGNOSIS — O34219 Maternal care for unspecified type scar from previous cesarean delivery: Secondary | ICD-10-CM

## 2016-06-11 DIAGNOSIS — Z3A37 37 weeks gestation of pregnancy: Secondary | ICD-10-CM | POA: Diagnosis not present

## 2016-06-11 LAB — CBC
HCT: 34.1 % — ABNORMAL LOW (ref 35.0–47.0)
HEMOGLOBIN: 12 g/dL (ref 12.0–16.0)
MCH: 32.3 pg (ref 26.0–34.0)
MCHC: 35.1 g/dL (ref 32.0–36.0)
MCV: 92.2 fL (ref 80.0–100.0)
PLATELETS: 122 10*3/uL — AB (ref 150–440)
RBC: 3.7 MIL/uL — ABNORMAL LOW (ref 3.80–5.20)
RDW: 12.8 % (ref 11.5–14.5)
WBC: 8.3 10*3/uL (ref 3.6–11.0)

## 2016-06-11 LAB — TYPE AND SCREEN
ABO/RH(D): O POS
ANTIBODY SCREEN: NEGATIVE

## 2016-06-11 MED ORDER — LIDOCAINE HCL (PF) 1 % IJ SOLN: 30.0000 mL | INTRAMUSCULAR | Status: DC | PRN

## 2016-06-11 MED ORDER — SENNOSIDES-DOCUSATE SODIUM 8.6-50 MG PO TABS
2.0000 | ORAL_TABLET | ORAL | Status: DC
  Administered 2016-06-12: 2 via ORAL
  Filled 2016-06-11: qty 2

## 2016-06-11 MED ORDER — ZOLPIDEM TARTRATE 5 MG PO TABS: 5.0000 mg | ORAL_TABLET | Freq: Every evening | ORAL | Status: DC | PRN

## 2016-06-11 MED ORDER — COCONUT OIL OIL: 1.0000 "application " | TOPICAL_OIL | Status: DC | PRN

## 2016-06-11 MED ORDER — TERBUTALINE SULFATE 1 MG/ML IJ SOLN: 0.2500 mg | Freq: Once | INTRAMUSCULAR | Status: DC | PRN

## 2016-06-11 MED ORDER — IBUPROFEN 600 MG PO TABS
600.0000 mg | ORAL_TABLET | Freq: Four times a day (QID) | ORAL | Status: DC
  Administered 2016-06-11 – 2016-06-12 (×4): 600 mg via ORAL
  Filled 2016-06-11 (×3): qty 1

## 2016-06-11 MED ORDER — ONDANSETRON HCL 4 MG/2ML IJ SOLN
4.0000 mg | Freq: Four times a day (QID) | INTRAMUSCULAR | Status: DC | PRN
  Administered 2016-06-11: 4 mg via INTRAVENOUS
  Filled 2016-06-11: qty 2

## 2016-06-11 MED ORDER — AMMONIA AROMATIC IN INHA
RESPIRATORY_TRACT | Status: AC
  Filled 2016-06-11: qty 10

## 2016-06-11 MED ORDER — ONDANSETRON HCL 4 MG PO TABS: 4.0000 mg | ORAL_TABLET | ORAL | Status: DC | PRN

## 2016-06-11 MED ORDER — FENTANYL CITRATE (PF) 100 MCG/2ML IJ SOLN: 50.0000 ug | INTRAMUSCULAR | Status: DC | PRN

## 2016-06-11 MED ORDER — DIBUCAINE 1 % RE OINT: 1.0000 "application " | TOPICAL_OINTMENT | RECTAL | Status: DC | PRN

## 2016-06-11 MED ORDER — MISOPROSTOL 200 MCG PO TABS
ORAL_TABLET | ORAL | Status: AC
  Filled 2016-06-11: qty 4

## 2016-06-11 MED ORDER — LACTATED RINGERS IV SOLN
INTRAVENOUS | Status: DC
  Administered 2016-06-11: 11:00:00 via INTRAVENOUS

## 2016-06-11 MED ORDER — OXYTOCIN BOLUS FROM INFUSION
500.0000 mL | Freq: Once | INTRAVENOUS | Status: AC
  Administered 2016-06-11: 500 mL via INTRAVENOUS

## 2016-06-11 MED ORDER — OXYTOCIN 40 UNITS IN LACTATED RINGERS INFUSION - SIMPLE MED
2.5000 [IU]/h | INTRAVENOUS | Status: DC
  Filled 2016-06-11: qty 1000

## 2016-06-11 MED ORDER — SOD CITRATE-CITRIC ACID 500-334 MG/5ML PO SOLN: 30.0000 mL | ORAL | Status: DC | PRN

## 2016-06-11 MED ORDER — CLINDAMYCIN PHOSPHATE 900 MG/50ML IV SOLN
900.0000 mg | Freq: Three times a day (TID) | INTRAVENOUS | Status: DC
  Administered 2016-06-11: 900 mg via INTRAVENOUS
  Filled 2016-06-11 (×3): qty 50

## 2016-06-11 MED ORDER — OXYTOCIN 40 UNITS IN LACTATED RINGERS INFUSION - SIMPLE MED
1.0000 m[IU]/min | INTRAVENOUS | Status: DC
  Administered 2016-06-11: 1 m[IU]/min via INTRAVENOUS

## 2016-06-11 MED ORDER — OXYCODONE HCL 5 MG PO TABS: 5.0000 mg | ORAL_TABLET | ORAL | Status: DC | PRN

## 2016-06-11 MED ORDER — SIMETHICONE 80 MG PO CHEW: 80.0000 mg | CHEWABLE_TABLET | ORAL | Status: DC | PRN

## 2016-06-11 MED ORDER — OXYTOCIN 10 UNIT/ML IJ SOLN
INTRAMUSCULAR | Status: AC
  Filled 2016-06-11: qty 2

## 2016-06-11 MED ORDER — ONDANSETRON HCL 4 MG/2ML IJ SOLN: 4.0000 mg | INTRAMUSCULAR | Status: DC | PRN

## 2016-06-11 MED ORDER — WITCH HAZEL-GLYCERIN EX PADS
1.0000 "application " | MEDICATED_PAD | CUTANEOUS | Status: DC | PRN
  Administered 2016-06-11: 1 via TOPICAL
  Filled 2016-06-11: qty 100

## 2016-06-11 MED ORDER — LACTATED RINGERS IV SOLN: 500.0000 mL | INTRAVENOUS | Status: DC | PRN

## 2016-06-11 MED ORDER — PRENATAL MULTIVITAMIN CH
1.0000 | ORAL_TABLET | Freq: Every day | ORAL | Status: DC
  Administered 2016-06-12 – 2016-06-13 (×2): 1 via ORAL
  Filled 2016-06-11 (×2): qty 1

## 2016-06-11 MED ORDER — ACETAMINOPHEN 325 MG PO TABS
650.0000 mg | ORAL_TABLET | ORAL | Status: DC | PRN
  Administered 2016-06-11 – 2016-06-13 (×5): 650 mg via ORAL
  Filled 2016-06-11 (×5): qty 2

## 2016-06-11 MED ORDER — SODIUM CHLORIDE FLUSH 0.9 % IV SOLN
INTRAVENOUS | Status: AC
  Administered 2016-06-11: 11:00:00
  Filled 2016-06-11: qty 10

## 2016-06-11 MED ORDER — LIDOCAINE HCL (PF) 1 % IJ SOLN
INTRAMUSCULAR | Status: AC
  Filled 2016-06-11: qty 30

## 2016-06-11 MED ORDER — OXYCODONE HCL 5 MG PO TABS: 10.0000 mg | ORAL_TABLET | ORAL | Status: DC | PRN

## 2016-06-11 MED ORDER — DIPHENHYDRAMINE HCL 25 MG PO CAPS: 25.0000 mg | ORAL_CAPSULE | Freq: Four times a day (QID) | ORAL | Status: DC | PRN

## 2016-06-11 MED ORDER — ACETAMINOPHEN 325 MG PO TABS
650.0000 mg | ORAL_TABLET | ORAL | Status: DC | PRN
  Administered 2016-06-11: 650 mg via ORAL
  Filled 2016-06-11: qty 2

## 2016-06-11 MED ORDER — IBUPROFEN 600 MG PO TABS
ORAL_TABLET | ORAL | Status: AC
  Filled 2016-06-11: qty 1

## 2016-06-11 MED ORDER — BENZOCAINE-MENTHOL 20-0.5 % EX AERO
1.0000 "application " | INHALATION_SPRAY | CUTANEOUS | Status: DC | PRN
  Filled 2016-06-11: qty 56

## 2016-06-11 NOTE — H&P (Signed)
Obstetric History and Physical  Paige Brown is a 31 y.o. G3P2002 with IUP at 28w3dpresenting with contractions. Patient states she has been having  irregular, every 4-7 minutes contractions, none vaginal bleeding, intact membranes, with active fetal movement.    [facility] Pregnancy complications or risks:previous c/s for breech followed by VBAC  Prenatal labs and studies: ABO, Rh: O/Positive/-- (07/17 0430) Antibody: Negative (07/17 0430) Rubella: 16.60 (07/17 0430) RPR: Non Reactive (07/17 0430)  HBsAg: Negative (07/17 0430)  HIV: Non Reactive (07/17 0430)  GBS: positive 1 hr Glucola  normal Genetic screening normal Anatomy [place]ormal  Past Medical History:  Diagnosis Date  . Anemia    with pregnancies  . GERD (gastroesophageal reflux disease)    during pregnancy  . UTI (lower urinary tract infection) 11/09/2015   2 more days to complete ATB    Past Surgical History:  Procedure Laterality Date  . CESAREAN SECTION    . TONSILLECTOMY  1994   and adnoids  . WISDOM TOOTH EXTRACTION     2000    OB History  Gravida Para Term Preterm AB Living  '3 2 2     2  ' SAB TAB Ectopic Multiple Live Births          2    # Outcome Date GA Lbr Len/2nd Weight Sex Delivery Anes PTL Lv  3 Current           2 Term 09/11/14 373w1d8 lb 5 oz (3.771 kg) M   Y LIV  1 Term 10/14/11 3924w1d lb 10 oz (3.459 kg) F CS-Unspec  N LIV    Obstetric Comments  1st pregnancy-breech  2nd pregnancy: PTL at 35 wks./ VBAC- no assistance    Social History   Social History  . Marital status: Married    Spouse name: N/A  . Number of children: N/A  . Years of education: N/A   Occupational History  . RN [employer]    [employer]   Social History Main Topics  . Smoking status: Never Smoker  . Smokeless tobacco: Never Used  . Alcohol use 0.0 oz/week     Comment: on occasionally when not breast feeding or pregnancy  . Drug use: No  . Sexual activity: Yes    Partners: Male    Other Topics Concern  . None   Social History Narrative  . None    Family History  Problem Relation Age of Onset  . Cancer Father     multiple Myeloma  . Rheum arthritis Maternal Grandmother   . Osteoarthritis Maternal Grandmother   . Breast cancer Maternal Grandmother 60  . Thyroid disease Maternal Grandmother   . Heart failure Maternal Grandfather   . Hyperlipidemia Maternal Grandfather   . Hypertension Maternal Grandfather   . Asthma Paternal Grandmother   . Stroke Paternal Grandfather   . Rheum arthritis Maternal Aunt   . Rheum arthritis Maternal Uncle     Prescriptions Prior to Admission  Medication Sig Dispense Refill Last Dose  . pantoprazole (PROTONIX) 40 MG tablet Take 1 tablet (40 mg total) by mouth daily. 30 tablet 3 06/11/2016 at Unknown time  . Prenatal Vit-Fe Fumarate-FA (PRENATAL VITAMINS) 28-0.8 MG TABS Take by mouth.   06/10/2016 at Unknown time  . ranitidine (ZANTAC) 150 MG capsule Take 150 mg by mouth 2 (two) times daily.   Past Week at Unknown time  . Vitamin D, Ergocalciferol, (DRISDOL) 50000 units CAPS capsule Take 1  capsule (50,000 Units total) by mouth 2 (two) times a week. 30 capsule 1 06/10/2016 at Unknown time    Allergies  Allergen Reactions  . Ciprofloxacin Nausea And Vomiting  . Penicillins Hives  . Shellfish Allergy Swelling  . Cefaclor Hives    Review of Systems: Negative except for what is mentioned in HPI.  Physical Exam: BP 123/83   Pulse 97   Temp 98.2 F (36.8 C) (Oral)   Resp 16   Ht '5\' 6"'  (1.676 m)   Wt 182 lb (82.6 kg)   LMP 09/23/2015 (Exact Date)   BMI 29.38 kg/m  GENERAL: Well-developed, well-nourished female in no acute distress.  LUNGS: Clear to auscultation bilaterally.  HEART: Regular rate and rhythm. ABDOMEN: Soft, nontender, nondistended, gravid. EXTREMITIES: Nontender, no edema, 2+ distal pulses. Cervical Exam: Dilation: 5 Effacement (%):  (75) Station: 0 Exam by:: H.Cahoon RN FHT:  Baseline rate 144 bpm    Variability moderate  Accelerations present   Decelerations none Contractions: Every 4-6 mins   Pertinent Labs/Studies:   No results found for this or any previous visit (from the past 24 hour(s)).  Assessment : Paige Brown is a 31 y.o. G3P2002 at 3w3dbeing admitted for labor. Dr EAmalia Haileyaware.  Plan: Labor: Expectant management.  Induction/Augmentation as needed, per protocol FWB: Reassuring fetal heart tracing.  GBS positive Delivery plan: Hopeful for vaginal delivery  Melody Shambley, CNM [facility]  I agree with the findings, assessment and plan as noted in this document.  DFinis Bud MD 06/12/2016 4:42 PM

## 2016-06-11 NOTE — Progress Notes (Signed)
Paige PriestRebecca Schreiner is a 31 y.o. G3P2002 at 3621w3d by LMP admitted for active labor  Subjective: Reports regular painful contractions when walking that space out when she lays down  Objective: BP 127/87   Pulse 100   Temp 98.1 F (36.7 C) (Axillary)   Resp 16   Ht 5\' 6"  (1.676 m)   Wt 182 lb (82.6 kg)   LMP 09/23/2015 (Exact Date)   BMI 29.38 kg/m  No intake/output data recorded. Total I/O In: -  Out: 750 [Emesis/NG output:750]  FHT:  FHR: 155 bpm, variability: moderate,  accelerations:  Present,  decelerations:  Absent UC:   irregular, every 2-6 minutes SVE:   Dilation: 7.5 Effacement (%): 80 Station: -1 Exam by:: M.Saryah Loper CNM  Labs: Lab Results  Component Value Date   WBC 8.3 06/11/2016   HGB 12.0 06/11/2016   HCT 34.1 (L) 06/11/2016   MCV 92.2 06/11/2016   PLT 122 (L) 06/11/2016    Assessment / Plan: Spontaneous labor, progressing normally  Labor: Progressing normally Preeclampsia:  labs stable Fetal Wellbeing:  Category I Pain Control:  Labor support without medications I/D:  n/a Anticipated MOD:  NSVD  Hiro Vipond N Shemiah Rosch 06/11/2016, 3:16 PM

## 2016-06-11 NOTE — Progress Notes (Signed)
Paige Brown is a 31 y.o. G3P2002 at 3945w3d by LMP admitted for active labor with previous c/s due to breech and VBAC.  Subjective: Reports moderate pain with contractions  Objective: BP 123/83   Pulse 97   Temp 98.2 F (36.8 C) (Oral)   Resp 16   Ht 5\' 6"  (1.676 m)   Wt 182 lb (82.6 kg)   LMP 09/23/2015 (Exact Date)   BMI 29.38 kg/m  No intake/output data recorded. No intake/output data recorded.  FHT:  FHR: 140 bpm, variability: moderate,  accelerations:  Present,  decelerations:  Absent UC:   irregular, every 3-7 minutes SVE:   Dilation: 5 Effacement (%): 70 Station: 0 Exam by:: Paige Brown CNM AROM clear fluid.  Labs: Lab Results  Component Value Date   WBC 8.3 06/11/2016   HGB 12.0 06/11/2016   HCT 34.1 (L) 06/11/2016   MCV 92.2 06/11/2016   PLT 122 (L) 06/11/2016    Assessment / Plan: Spontaneous labor, progressing normally  Labor: Progressing normally Preeclampsia:  labs stable Fetal Wellbeing:  Category I Pain Control:  Labor support without medications I/D:  n/a Anticipated MOD:  NSVD  Paige Brown 06/11/2016, 11:35 AM

## 2016-06-11 NOTE — OB Triage Note (Signed)
Patient presents to triage with complaints of contractions times 5 days, last checked in office on Friday was dilated to 4 cm and effaced to 70%, per patient. No gushes of fluid, some bloody show. Contractions became regular around 3 am coming every 3-5 minutes.

## 2016-06-12 LAB — CBC
HCT: 30.2 % — ABNORMAL LOW (ref 35.0–47.0)
HEMOGLOBIN: 10.8 g/dL — AB (ref 12.0–16.0)
MCH: 33 pg (ref 26.0–34.0)
MCHC: 35.8 g/dL (ref 32.0–36.0)
MCV: 92.1 fL (ref 80.0–100.0)
PLATELETS: 97 10*3/uL — AB (ref 150–440)
RBC: 3.28 MIL/uL — AB (ref 3.80–5.20)
RDW: 12.8 % (ref 11.5–14.5)
WBC: 5.7 10*3/uL (ref 3.6–11.0)

## 2016-06-12 LAB — RPR: RPR Ser Ql: NONREACTIVE

## 2016-06-12 MED ORDER — IBUPROFEN 600 MG PO TABS
600.0000 mg | ORAL_TABLET | Freq: Four times a day (QID) | ORAL | Status: DC
  Administered 2016-06-12 – 2016-06-13 (×2): 600 mg via ORAL
  Filled 2016-06-12 (×2): qty 1

## 2016-06-12 NOTE — Progress Notes (Signed)
Post Partum Day 1 Subjective: no complaints, up ad lib and voiding  Objective: Blood pressure 122/81, pulse 85, temperature 98.3 F (36.8 C), temperature source Oral, resp. rate 18, height 5\' 6"  (1.676 m), weight 182 lb (82.6 kg), last menstrual period 09/23/2015, SpO2 100 %, unknown if currently breastfeeding.  Physical Exam:  General: alert, cooperative and appears stated age Lochia: appropriate Uterine Fundus: firm Incision: NA DVT Evaluation: No evidence of DVT seen on physical exam. Negative Homan's sign.   Recent Labs  06/11/16 1058 06/12/16 0536  HGB 12.0 10.8*  HCT 34.1* 30.2*    Assessment/Plan: Plan for discharge tomorrow and Breastfeeding   LOS: 1 day   Paige Brown N Jasiyah Paulding 06/12/2016, 2:23 PM

## 2016-06-13 MED ORDER — FUSION PLUS PO CAPS: 1.0000 | ORAL_CAPSULE | Freq: Every day | ORAL | 1 refills | Status: DC

## 2016-06-13 NOTE — Progress Notes (Signed)
Patient discharge to home via wheelchair with spouse and baby in car seat.  

## 2016-06-13 NOTE — Discharge Summary (Signed)
OB Discharge Summary     Patient Name: Paige Brown DOB: May 15, 1985 MRN: [id number]  Date of admission: 06/11/2016 Delivering MD: Purcell Nails   Date of discharge: 06/13/2016  Admitting diagnosis: 1 Intrauterine pregnancy: [redacted]w[redacted]d     Secondary diagnosis:  Active Problems:   Labor and delivery, indication for care   Vaginal birth after cesarean (VBAC)  Additional problems: low platelets     Discharge diagnosis: Term Pregnancy Delivered                                                                                                Post partum procedures:none  Augmentation: AROM and Pitocin  Complications: None  Hospital course:  Onset of Labor With Vaginal Delivery     31 y.o. yo G3P3003 at [redacted]w[redacted]d was admitted in Latent Labor on 06/11/2016. Patient had an uncomplicated labor course as follows:  Membrane Rupture Time/Date: 11:23 AM ,06/11/2016   Intrapartum Procedures: Episiotomy: None [1]                                         Lacerations:  1st degree [2]  Patient had a delivery of a Viable infant. 06/11/2016  Information for the patient's newborn:  Keeghan, Mcintire [[id number]]  Delivery Method: Vag-Spont    Pateint had an uncomplicated postpartum course.  She is ambulating, tolerating a regular diet, passing flatus, and urinating well. Patient is discharged home in stable condition on 06/13/16.   Physical exam  Vitals:   06/12/16 1229 06/12/16 1627 06/12/16 2000 06/13/16 0745  BP: 122/81  116/82 116/74  Pulse: 85  92 69  Resp: 18  18 18   Temp: 98.3 F (36.8 C) 98.2 F (36.8 C) 98.6 F (37 C) 98.6 F (37 C)  TempSrc: Oral Oral Oral Oral  SpO2:    98%  Weight:      Height:       General: alert, cooperative and no distress Lochia: appropriate Uterine Fundus: firm Incision: N/A DVT Evaluation: No evidence of DVT seen on physical exam. Negative Homan's sign. Labs: Lab Results  Component Value Date   WBC 5.7 06/12/2016   HGB 10.8 (L) 06/12/2016    HCT 30.2 (L) 06/12/2016   MCV 92.1 06/12/2016   PLT 97 (L) 06/12/2016   CMP Latest Ref Rng & Units 04/08/2016  Glucose 65 - 99 mg/dL 71  BUN 6 - 20 mg/dL 5(L)  Creatinine 7.82 - 1.00 mg/dL 9.56(O)  Sodium 130 - 865 mmol/L 137  Potassium 3.5 - 5.2 mmol/L 3.6  Chloride 96 - 106 mmol/L 101  CO2 18 - 29 mmol/L 22  Calcium 8.7 - 10.2 mg/dL 8.0(L)  Total Protein 6.0 - 8.5 g/dL 6.1  Total Bilirubin 0.0 - 1.2 mg/dL 0.5  Alkaline Phos 39 - 117 IU/L 73  AST 0 - 40 IU/L 9  ALT 0 - 32 IU/L 9    Discharge instruction: per After Visit Summary and "Baby and Me Booklet".  After visit meds:    Diet: routine  diet  Activity: Advance as tolerated. Pelvic rest for 6 weeks.   Outpatient follow up:6 weeks Follow up Appt:No future appointments. Follow up Visit:No Follow-up on file.  Postpartum contraception: Vasectomy and Condoms  Newborn Data: Live born female  Birth Weight: 6 lb 14 oz (3118 g) APGAR: 8, 9  Baby Feeding: Breast Disposition:home with mother   06/13/2016 Purcell NailsMelody N Shambley, CNM

## 2016-06-14 ENCOUNTER — Encounter: Payer: 59 | Admitting: Obstetrics and Gynecology

## 2016-06-15 ENCOUNTER — Encounter: Payer: 59 | Admitting: Obstetrics and Gynecology

## 2016-06-17 ENCOUNTER — Other Ambulatory Visit: Payer: Self-pay | Admitting: *Deleted

## 2016-06-17 ENCOUNTER — Other Ambulatory Visit: Payer: 59

## 2016-06-17 DIAGNOSIS — D72820 Lymphocytosis (symptomatic): Secondary | ICD-10-CM | POA: Diagnosis not present

## 2016-06-18 LAB — CBC
HEMATOCRIT: 34.9 % (ref 34.0–46.6)
Hemoglobin: 11.9 g/dL (ref 11.1–15.9)
MCH: 31.3 pg (ref 26.6–33.0)
MCHC: 34.1 g/dL (ref 31.5–35.7)
MCV: 92 fL (ref 79–97)
Platelets: 185 10*3/uL (ref 150–379)
RBC: 3.8 x10E6/uL (ref 3.77–5.28)
RDW: 13.1 % (ref 12.3–15.4)
WBC: 7 10*3/uL (ref 3.4–10.8)

## 2016-07-20 ENCOUNTER — Encounter: Payer: Self-pay | Admitting: Obstetrics and Gynecology

## 2016-07-20 ENCOUNTER — Ambulatory Visit (INDEPENDENT_AMBULATORY_CARE_PROVIDER_SITE_OTHER): Payer: 59 | Admitting: Obstetrics and Gynecology

## 2016-07-20 NOTE — Patient Instructions (Signed)
  Place postpartum visit patient instructions here.  

## 2016-07-20 NOTE — Progress Notes (Signed)
   Subjective:     Paige Brown is a 31 y.o. female who presents for a postpartum visit. She is 6 weeks postpartum following a spontaneous vaginal delivery after previous c/s. I have fully reviewed the prenatal and intrapartum course. The delivery was at 39 gestational weeks. Outcome: vaginal birth after cesarean (VBAC). Anesthesia: none. Postpartum course has been uncomplicated. Baby's course has been uncomplicated. Baby is feeding by breast. Bleeding no bleeding. Bowel function is normal. Bladder function is normal. Patient is not sexually active. Contraception method is condoms. Postpartum depression screening: negative.  The following portions of the patient's history were reviewed and updated as appropriate: allergies, current medications, past family history, past medical history, past social history, past surgical history and problem list.  Review of Systems A comprehensive review of systems was negative.   Objective:    BP 129/81   Pulse (!) 107   Ht 5\' 5"  (1.651 m)   Wt 171 lb 12.8 oz (77.9 kg)   Breastfeeding? Yes   BMI 28.59 kg/m   General:  alert, cooperative and appears stated age   Breasts:  inspection negative, no nipple discharge or bleeding, no masses or nodularity palpable  Lungs: clear to auscultation bilaterally  Heart:  regular rate and rhythm, S1, S2 normal, no murmur, click, rub or gallop  Abdomen: soft, non-tender; bowel sounds normal; no masses,  no organomegaly   Vulva:  normal  Vagina: normal vagina, no discharge, exudate, lesion, or erythema  Cervix:  multiparous appearance  Corpus: normal size, contour, position, consistency, mobility, non-tender  Adnexa:  no mass, fullness, tenderness  Rectal Exam: Not performed.        Assessment:     6 weeks postpartum exam. Pap smear not done at today's visit.   Plan:    1. Contraception: condoms 2.lactational amenorrhea 3. Follow up in: 4 months or as needed.

## 2016-07-21 ENCOUNTER — Other Ambulatory Visit: Payer: Self-pay | Admitting: Obstetrics and Gynecology

## 2016-07-21 LAB — FERRITIN: FERRITIN: 23 ng/mL (ref 15–150)

## 2016-07-21 LAB — CBC
Hematocrit: 41.1 % (ref 34.0–46.6)
Hemoglobin: 13.7 g/dL (ref 11.1–15.9)
MCH: 30.1 pg (ref 26.6–33.0)
MCHC: 33.3 g/dL (ref 31.5–35.7)
MCV: 90 fL (ref 79–97)
Platelets: 165 10*3/uL (ref 150–379)
RBC: 4.55 x10E6/uL (ref 3.77–5.28)
RDW: 12.5 % (ref 12.3–15.4)
WBC: 6 10*3/uL (ref 3.4–10.8)

## 2016-07-21 LAB — VITAMIN D 25 HYDROXY (VIT D DEFICIENCY, FRACTURES): VIT D 25 HYDROXY: 21.6 ng/mL — AB (ref 30.0–100.0)

## 2016-07-21 MED ORDER — VITAMIN D (ERGOCALCIFEROL) 1.25 MG (50000 UNIT) PO CAPS: 50000.0000 [IU] | ORAL_CAPSULE | ORAL | 1 refills | Status: DC

## 2016-10-03 DIAGNOSIS — M5137 Other intervertebral disc degeneration, lumbosacral region: Secondary | ICD-10-CM | POA: Diagnosis not present

## 2016-10-03 DIAGNOSIS — M9901 Segmental and somatic dysfunction of cervical region: Secondary | ICD-10-CM | POA: Diagnosis not present

## 2016-10-03 DIAGNOSIS — M530 Cervicocranial syndrome: Secondary | ICD-10-CM | POA: Diagnosis not present

## 2016-10-03 DIAGNOSIS — M9905 Segmental and somatic dysfunction of pelvic region: Secondary | ICD-10-CM | POA: Diagnosis not present

## 2016-10-03 DIAGNOSIS — M9903 Segmental and somatic dysfunction of lumbar region: Secondary | ICD-10-CM | POA: Diagnosis not present

## 2016-10-03 DIAGNOSIS — M955 Acquired deformity of pelvis: Secondary | ICD-10-CM | POA: Diagnosis not present

## 2016-10-03 DIAGNOSIS — M546 Pain in thoracic spine: Secondary | ICD-10-CM | POA: Diagnosis not present

## 2016-10-03 DIAGNOSIS — M461 Sacroiliitis, not elsewhere classified: Secondary | ICD-10-CM | POA: Diagnosis not present

## 2016-10-03 DIAGNOSIS — M9902 Segmental and somatic dysfunction of thoracic region: Secondary | ICD-10-CM | POA: Diagnosis not present

## 2016-10-05 DIAGNOSIS — M9902 Segmental and somatic dysfunction of thoracic region: Secondary | ICD-10-CM | POA: Diagnosis not present

## 2016-10-05 DIAGNOSIS — M9901 Segmental and somatic dysfunction of cervical region: Secondary | ICD-10-CM | POA: Diagnosis not present

## 2016-10-05 DIAGNOSIS — M5137 Other intervertebral disc degeneration, lumbosacral region: Secondary | ICD-10-CM | POA: Diagnosis not present

## 2016-10-05 DIAGNOSIS — M461 Sacroiliitis, not elsewhere classified: Secondary | ICD-10-CM | POA: Diagnosis not present

## 2016-10-05 DIAGNOSIS — M9905 Segmental and somatic dysfunction of pelvic region: Secondary | ICD-10-CM | POA: Diagnosis not present

## 2016-10-05 DIAGNOSIS — M546 Pain in thoracic spine: Secondary | ICD-10-CM | POA: Diagnosis not present

## 2016-10-05 DIAGNOSIS — M530 Cervicocranial syndrome: Secondary | ICD-10-CM | POA: Diagnosis not present

## 2016-10-05 DIAGNOSIS — M9903 Segmental and somatic dysfunction of lumbar region: Secondary | ICD-10-CM | POA: Diagnosis not present

## 2016-10-05 DIAGNOSIS — M955 Acquired deformity of pelvis: Secondary | ICD-10-CM | POA: Diagnosis not present

## 2016-10-10 DIAGNOSIS — M546 Pain in thoracic spine: Secondary | ICD-10-CM | POA: Diagnosis not present

## 2016-10-10 DIAGNOSIS — M9902 Segmental and somatic dysfunction of thoracic region: Secondary | ICD-10-CM | POA: Diagnosis not present

## 2016-10-10 DIAGNOSIS — M9905 Segmental and somatic dysfunction of pelvic region: Secondary | ICD-10-CM | POA: Diagnosis not present

## 2016-10-10 DIAGNOSIS — M9903 Segmental and somatic dysfunction of lumbar region: Secondary | ICD-10-CM | POA: Diagnosis not present

## 2016-10-10 DIAGNOSIS — M5137 Other intervertebral disc degeneration, lumbosacral region: Secondary | ICD-10-CM | POA: Diagnosis not present

## 2016-10-10 DIAGNOSIS — M955 Acquired deformity of pelvis: Secondary | ICD-10-CM | POA: Diagnosis not present

## 2016-10-10 DIAGNOSIS — M461 Sacroiliitis, not elsewhere classified: Secondary | ICD-10-CM | POA: Diagnosis not present

## 2016-10-10 DIAGNOSIS — M9901 Segmental and somatic dysfunction of cervical region: Secondary | ICD-10-CM | POA: Diagnosis not present

## 2016-10-10 DIAGNOSIS — M530 Cervicocranial syndrome: Secondary | ICD-10-CM | POA: Diagnosis not present

## 2016-10-17 DIAGNOSIS — M955 Acquired deformity of pelvis: Secondary | ICD-10-CM | POA: Diagnosis not present

## 2016-10-17 DIAGNOSIS — M9901 Segmental and somatic dysfunction of cervical region: Secondary | ICD-10-CM | POA: Diagnosis not present

## 2016-10-17 DIAGNOSIS — M546 Pain in thoracic spine: Secondary | ICD-10-CM | POA: Diagnosis not present

## 2016-10-17 DIAGNOSIS — M9902 Segmental and somatic dysfunction of thoracic region: Secondary | ICD-10-CM | POA: Diagnosis not present

## 2016-10-17 DIAGNOSIS — M530 Cervicocranial syndrome: Secondary | ICD-10-CM | POA: Diagnosis not present

## 2016-10-17 DIAGNOSIS — M461 Sacroiliitis, not elsewhere classified: Secondary | ICD-10-CM | POA: Diagnosis not present

## 2016-10-17 DIAGNOSIS — M5137 Other intervertebral disc degeneration, lumbosacral region: Secondary | ICD-10-CM | POA: Diagnosis not present

## 2016-10-17 DIAGNOSIS — M9903 Segmental and somatic dysfunction of lumbar region: Secondary | ICD-10-CM | POA: Diagnosis not present

## 2016-10-17 DIAGNOSIS — M9905 Segmental and somatic dysfunction of pelvic region: Secondary | ICD-10-CM | POA: Diagnosis not present

## 2016-10-19 DIAGNOSIS — M955 Acquired deformity of pelvis: Secondary | ICD-10-CM | POA: Diagnosis not present

## 2016-10-19 DIAGNOSIS — M5137 Other intervertebral disc degeneration, lumbosacral region: Secondary | ICD-10-CM | POA: Diagnosis not present

## 2016-10-19 DIAGNOSIS — M9905 Segmental and somatic dysfunction of pelvic region: Secondary | ICD-10-CM | POA: Diagnosis not present

## 2016-10-19 DIAGNOSIS — M461 Sacroiliitis, not elsewhere classified: Secondary | ICD-10-CM | POA: Diagnosis not present

## 2016-10-19 DIAGNOSIS — M9902 Segmental and somatic dysfunction of thoracic region: Secondary | ICD-10-CM | POA: Diagnosis not present

## 2016-10-19 DIAGNOSIS — M9901 Segmental and somatic dysfunction of cervical region: Secondary | ICD-10-CM | POA: Diagnosis not present

## 2016-10-19 DIAGNOSIS — M530 Cervicocranial syndrome: Secondary | ICD-10-CM | POA: Diagnosis not present

## 2016-10-19 DIAGNOSIS — M9903 Segmental and somatic dysfunction of lumbar region: Secondary | ICD-10-CM | POA: Diagnosis not present

## 2016-10-19 DIAGNOSIS — M546 Pain in thoracic spine: Secondary | ICD-10-CM | POA: Diagnosis not present

## 2016-10-21 DIAGNOSIS — M5137 Other intervertebral disc degeneration, lumbosacral region: Secondary | ICD-10-CM | POA: Diagnosis not present

## 2016-10-21 DIAGNOSIS — M9905 Segmental and somatic dysfunction of pelvic region: Secondary | ICD-10-CM | POA: Diagnosis not present

## 2016-10-21 DIAGNOSIS — M9903 Segmental and somatic dysfunction of lumbar region: Secondary | ICD-10-CM | POA: Diagnosis not present

## 2016-10-21 DIAGNOSIS — M546 Pain in thoracic spine: Secondary | ICD-10-CM | POA: Diagnosis not present

## 2016-10-21 DIAGNOSIS — M461 Sacroiliitis, not elsewhere classified: Secondary | ICD-10-CM | POA: Diagnosis not present

## 2016-10-21 DIAGNOSIS — M9901 Segmental and somatic dysfunction of cervical region: Secondary | ICD-10-CM | POA: Diagnosis not present

## 2016-10-21 DIAGNOSIS — M9902 Segmental and somatic dysfunction of thoracic region: Secondary | ICD-10-CM | POA: Diagnosis not present

## 2016-10-21 DIAGNOSIS — M530 Cervicocranial syndrome: Secondary | ICD-10-CM | POA: Diagnosis not present

## 2016-10-21 DIAGNOSIS — M955 Acquired deformity of pelvis: Secondary | ICD-10-CM | POA: Diagnosis not present

## 2016-10-26 DIAGNOSIS — M530 Cervicocranial syndrome: Secondary | ICD-10-CM | POA: Diagnosis not present

## 2016-10-26 DIAGNOSIS — M461 Sacroiliitis, not elsewhere classified: Secondary | ICD-10-CM | POA: Diagnosis not present

## 2016-10-26 DIAGNOSIS — M9905 Segmental and somatic dysfunction of pelvic region: Secondary | ICD-10-CM | POA: Diagnosis not present

## 2016-10-26 DIAGNOSIS — M955 Acquired deformity of pelvis: Secondary | ICD-10-CM | POA: Diagnosis not present

## 2016-10-26 DIAGNOSIS — M546 Pain in thoracic spine: Secondary | ICD-10-CM | POA: Diagnosis not present

## 2016-10-26 DIAGNOSIS — M9902 Segmental and somatic dysfunction of thoracic region: Secondary | ICD-10-CM | POA: Diagnosis not present

## 2016-10-26 DIAGNOSIS — M9901 Segmental and somatic dysfunction of cervical region: Secondary | ICD-10-CM | POA: Diagnosis not present

## 2016-10-26 DIAGNOSIS — M9903 Segmental and somatic dysfunction of lumbar region: Secondary | ICD-10-CM | POA: Diagnosis not present

## 2016-10-26 DIAGNOSIS — M5137 Other intervertebral disc degeneration, lumbosacral region: Secondary | ICD-10-CM | POA: Diagnosis not present

## 2016-12-03 ENCOUNTER — Ambulatory Visit
Admission: EM | Admit: 2016-12-03 | Discharge: 2016-12-03 | Disposition: A | Discharge status: Home / Self Care | Payer: 59 | Attending: Family Medicine | Admitting: Family Medicine

## 2016-12-03 DIAGNOSIS — N390 Urinary tract infection, site not specified: Secondary | ICD-10-CM

## 2016-12-03 DIAGNOSIS — R319 Hematuria, unspecified: Secondary | ICD-10-CM

## 2016-12-03 LAB — PREGNANCY, URINE: PREG TEST UR: NEGATIVE

## 2016-12-03 LAB — URINALYSIS, COMPLETE (UACMP) WITH MICROSCOPIC: Squamous Epithelial / HPF: NONE SEEN

## 2016-12-03 MED ORDER — NITROFURANTOIN MONOHYD MACRO 100 MG PO CAPS: 100.0000 mg | ORAL_CAPSULE | Freq: Two times a day (BID) | ORAL | 0 refills | Status: AC

## 2016-12-03 NOTE — Discharge Instructions (Signed)
Take medication as prescribed. Rest. Drink plenty of fluids.  ° °Follow up with your primary care physician this week as needed. Return to Urgent care for new or worsening concerns.  ° °

## 2016-12-03 NOTE — ED Provider Notes (Signed)
[facility] ____________________________________________  Time seen: Approximately 11:20 AM  I have reviewed the triage vital signs and the nursing notes.   HISTORY  Chief Complaint Recurrent UTI   HPI Nataliyah Packham is a 31 y.o. female presents for evaluation of a UA frequency, urinary urgency and burning with urination is been present since early this morning. 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 Patient reports that she has a history of frequent UTIs. States last UTI was approximately 7 months ago during pregnancy. Patient states that she is 6 months post partum and is breast-feeding. Reports she had a unremarkable pregnancy, delivery and has a healthy 49-monthold. States 610-monthld was not premature or has any health problems. Patient reports that she has multiple antibiotic allergies including Cipro, penicillins and cephalosporins. Patient reports sexually active with 1 partner. Patient states that a few days ago she did not void post sex as well as has to hold her urine frequently while at work. This did take 1 over-the-counter Azo early this morning which helped with symptoms. Denies taking other over-the-counter medication. Denies other aggravating or alleviating factors. States concerned this may be trigger for her current symptoms. Denies vaginal complaints, vaginal discharge or vaginal discomfort. Declines concerns STDs. Reports continues to eat and drink well. Denies associated abdominal pain, back pain, fevers, nausea, vomiting diarrhea. Denies any other recent sickness or recent antibiotic use. Denies known resistance to an antibiotic so in urine.Denies chest pain, shortness of breath, abdominal pain, or rash. Denies recent sickness. Denies recent antibiotic use.   LMP: Denies pregnancy. Reports 6 months postpartum, has not yet had first menstrual post delivery.  BaDerinda LateMD: PCP    Past Medical  History:  Diagnosis Date  . Anemia    with pregnancies  . GERD (gastroesophageal reflux disease)    during pregnancy  . UTI (lower urinary tract infection) 11/09/2015   2 more days to complete ATB    Patient Active Problem List   Diagnosis Date Noted  . Labor and delivery, indication for care 06/11/2016  . Vaginal birth after cesarean (VBAC) 06/11/2016  . Vitamin D deficiency 04/12/2016  . Temporary low platelet count ([facility] [place]12/04/2016  . History of vaginal delivery following previous cesarean delivery 01/05/2016    Past Surgical History:  Procedure Laterality Date  . CESAREAN SECTION    . TONSILLECTOMY  1994   and adnoids  . WISDOM TOOTH EXTRACTION     2000     No current facility-administered medications for this encounter.   Current Outpatient Prescriptions:  .  Prenatal Vit-Fe Fumarate-FA (PRENATAL VITAMINS) 28-0.8 MG TABS, Take by mouth., Disp: , Rfl:  .  Iron-FA-B Cmp-C-Biot-Probiotic (FUSION PLUS) CAPS, Take 1 capsule by mouth daily. (Patient not taking: Reported on 07/20/2016), Disp: 60 capsule, Rfl: 1 .  nitrofurantoin, macrocrystal-monohydrate, (MACROBID) 100 MG capsule, Take 1 capsule (100 mg total) by mouth 2 (two) times daily., Disp: 10 capsule, Rfl: 0 .  Vitamin D, Ergocalciferol, (DRISDOL) 50000 units CAPS capsule, Take 1 capsule (50,000 Units total) by mouth 2 (two) times a week., Disp: 30 capsule, Rfl: 1  Allergies Ciprofloxacin; Penicillins; Shellfish allergy; and Cefaclor  Family History  Problem Relation Age of Onset  . Cancer Father        multiple Myeloma  . Rheum arthritis Maternal Grandmother   . Osteoarthritis Maternal Grandmother   . Breast cancer Maternal Grandmother 60  . Thyroid disease Maternal Grandmother   . Heart failure Maternal  Grandfather   . Hyperlipidemia Maternal Grandfather   . Hypertension Maternal Grandfather   . Asthma Paternal Grandmother   . Stroke Paternal Grandfather   . Rheum arthritis Maternal Aunt   . Rheum arthritis  Maternal Uncle     Social History Social History  Substance Use Topics  . Smoking status: Never Smoker  . Smokeless tobacco: Never Used  . Alcohol use 0.0 oz/week     Comment: on occasionally when not breast feeding or pregnancy    Review of Systems Constitutional: No fever/chills  Cardiovascular: Denies chest pain. Respiratory: Denies shortness of breath. Gastrointestinal: No abdominal pain.  No nausea, no vomiting.  No diarrhea.   Genitourinary: Positive for dysuria. Musculoskeletal: Negative for back pain.   ____________________________________________   PHYSICAL EXAM:  VITAL SIGNS: ED Triage Vitals  Enc Vitals Group     BP 12/03/16 1015 119/86     Pulse Rate 12/03/16 1015 85     Resp 12/03/16 1015 16     Temp 12/03/16 1015 97.7 F (36.5 C)     Temp Source 12/03/16 1015 Oral     SpO2 12/03/16 1015 100 %     Weight --      Height --      Head Circumference --      Peak Flow --      Pain Score 12/03/16 1013 6     Pain Loc --      Pain Edu? --      Excl. in [place]? --     Constitutional: Alert and oriented. Well appearing and in no acute distress. Cardiovascular: Normal rate, regular rhythm. Grossly normal heart sounds.  Good peripheral circulation. Respiratory: Normal respiratory effort without tachypnea nor retractions. Breath sounds are clear and equal bilaterally. No wheezes, rales, rhonchi. Gastrointestinal: Soft and nontender. No distention.  No CVA tenderness. Musculoskeletal:  Steady gait. No midline cervical, thoracic or lumbar tenderness to palpation. Neurologic:  Normal speech and language.  Speech is normal. No gait instability.  Skin:  Skin is warm, dry. Psychiatric: Mood and affect are normal. Speech and behavior are normal. Patient exhibits appropriate insight and judgment   ___________________________________________   LABS (all labs ordered are listed, but only abnormal results are displayed)  Labs Reviewed  URINALYSIS, COMPLETE (UACMP) WITH  MICROSCOPIC - Abnormal; Notable for the following:       Result Value   Color, Urine AMBER (*)    APPearance CLOUDY (*)    Glucose, UA   (*)    Value: TEST NOT REPORTED DUE TO COLOR INTERFERENCE OF URINE PIGMENT   Hgb urine dipstick   (*)    Value: TEST NOT REPORTED DUE TO COLOR INTERFERENCE OF URINE PIGMENT   Bilirubin Urine   (*)    Value: TEST NOT REPORTED DUE TO COLOR INTERFERENCE OF URINE PIGMENT   Ketones, ur   (*)    Value: TEST NOT REPORTED DUE TO COLOR INTERFERENCE OF URINE PIGMENT   Protein, ur   (*)    Value: TEST NOT REPORTED DUE TO COLOR INTERFERENCE OF URINE PIGMENT   Nitrite   (*)    Value: TEST NOT REPORTED DUE TO COLOR INTERFERENCE OF URINE PIGMENT   Leukocytes, UA   (*)    Value: TEST NOT REPORTED DUE TO COLOR INTERFERENCE OF URINE PIGMENT   Bacteria, UA RARE (*)    All other components within normal limits  URINE CULTURE  PREGNANCY, URINE   ____________________________________________  PROCEDURES Procedures    INITIAL IMPRESSION /  ASSESSMENT AND PLAN / ED COURSE  Pertinent labs & imaging results that were available during my care of the patient were reviewed by me and considered in my medical decision making (see chart for details).  Living patient. No acute distress. Presents for dysuria. Denies vaginal complaints or other complaints. Urinalysis reviewed, rare bacteria, cloudy and did take Azo early this a.m. suspect onset of UTI. Will culture urine. Urine pregnancy negative. Patient with multiple antibiotic allergies and is currently breast-feeding. Patient reports healthy term child that is 42 months old. Counsel regarding antibiotic use and excretion in breast milk, patient verbalizes understanding. Will treat patient with oral Macrobid. Rest, fluids, supportive care, avoidance of triggers. Discussed indication, risks and benefits of medications with patient.  Discussed follow up with Primary care physician this week as needed. Discussed follow up and return  parameters including no resolution or any worsening concerns. Patient verbalized understanding and agreed to plan.   ____________________________________________   FINAL CLINICAL IMPRESSION(S) / ED DIAGNOSES  Final diagnoses:  Urinary tract infection with hematuria, site unspecified     Discharge Medication List as of 12/03/2016 10:49 AM    START taking these medications   Details  nitrofurantoin, macrocrystal-monohydrate, (MACROBID) 100 MG capsule Take 1 capsule (100 mg total) by mouth 2 (two) times daily., Starting Sat 12/03/2016, Until Thu 12/08/2016, Normal        Note: This dictation was prepared with Dragon dictation along with smaller phrase technology. Any transcriptional errors that result from this process are unintentional.         Marylene Land, NP 12/03/16 1127

## 2016-12-03 NOTE — ED Triage Notes (Addendum)
Onset tonight urgency, frequency, bladder spasm pt is breastfeeding and had taken Azo this morning

## 2016-12-06 LAB — URINE CULTURE: Culture: 30000 — AB

## 2016-12-17 ENCOUNTER — Emergency Department
Admission: EM | Admit: 2016-12-17 | Discharge: 2016-12-17 | Disposition: A | Discharge status: Home / Self Care | Payer: PRIVATE HEALTH INSURANCE | Attending: Emergency Medicine | Admitting: Emergency Medicine

## 2016-12-17 ENCOUNTER — Encounter: Payer: Self-pay | Admitting: Emergency Medicine

## 2016-12-17 ENCOUNTER — Emergency Department: Payer: PRIVATE HEALTH INSURANCE

## 2016-12-17 DIAGNOSIS — Z79899 Other long term (current) drug therapy: Secondary | ICD-10-CM | POA: Diagnosis not present

## 2016-12-17 DIAGNOSIS — W19XXXA Unspecified fall, initial encounter: Secondary | ICD-10-CM | POA: Diagnosis not present

## 2016-12-17 DIAGNOSIS — Y929 Unspecified place or not applicable: Secondary | ICD-10-CM | POA: Insufficient documentation

## 2016-12-17 DIAGNOSIS — Y939 Activity, unspecified: Secondary | ICD-10-CM | POA: Insufficient documentation

## 2016-12-17 DIAGNOSIS — Y999 Unspecified external cause status: Secondary | ICD-10-CM | POA: Insufficient documentation

## 2016-12-17 DIAGNOSIS — S6991XA Unspecified injury of right wrist, hand and finger(s), initial encounter: Secondary | ICD-10-CM | POA: Diagnosis present

## 2016-12-17 DIAGNOSIS — S52124A Nondisplaced fracture of head of right radius, initial encounter for closed fracture: Secondary | ICD-10-CM | POA: Diagnosis not present

## 2016-12-17 LAB — POCT PREGNANCY, URINE: PREG TEST UR: NEGATIVE

## 2016-12-17 MED ORDER — KETOROLAC TROMETHAMINE 60 MG/2ML IM SOLN: 60.0000 mg | Freq: Once | INTRAMUSCULAR | Status: DC

## 2016-12-17 MED ORDER — HYDROMORPHONE HCL 2 MG PO TABS: 1.0000 mg | ORAL_TABLET | ORAL | 0 refills | Status: DC | PRN

## 2016-12-17 MED ORDER — ACETAMINOPHEN 500 MG PO TABS
1000.0000 mg | ORAL_TABLET | Freq: Once | ORAL | Status: AC
  Administered 2016-12-17: 1000 mg via ORAL
  Filled 2016-12-17: qty 2

## 2016-12-17 NOTE — ED Triage Notes (Signed)
Pt ambulatory to rm 24, tripped and fell at work, pain to right forearm/wrist/elbow.  No obvious deformity.  Increased pain w/ movement

## 2016-12-17 NOTE — Discharge Instructions (Signed)
Please seek medical attention for any high fevers, chest pain, shortness of breath, change in behavior, persistent vomiting, bloody stool or any other new or concerning symptoms.  

## 2016-12-17 NOTE — ED Provider Notes (Signed)
[facility] Emergency Department Provider Note   ____________________________________________   I have reviewed the triage vital signs and the nursing notes.   HISTORY  Chief Complaint Right arm pain  History limited by: Not Limited   HPI Paige Brown is a 31 y.o. female who presents today because of concerns for right arm pain. The injury occurred shortly before checking into the emergency department. The patient fell onto her arms. She started developing pain around her right wrist. She also has pain around her right elbow. She feels like the pain is traveling up her right arm. She has broken her right forearm in the past as a child. She denies any other injury.   Past Medical History:  Diagnosis Date  . Anemia    with pregnancies  . GERD (gastroesophageal reflux disease)    during pregnancy  . UTI (lower urinary tract infection) 11/09/2015   2 more days to complete ATB    Patient Active Problem List   Diagnosis Date Noted  . Labor and delivery, indication for care 06/11/2016  . Vaginal birth after cesarean (VBAC) 06/11/2016  . Vitamin D deficiency 04/12/2016  . Temporary low platelet count ([place]) 04/12/2016  . History of vaginal delivery following previous cesarean delivery 01/05/2016    Past Surgical History:  Procedure Laterality Date  . CESAREAN SECTION    . TONSILLECTOMY  1994   and adnoids  . [place]    Prior to Admission medications   Medication Sig Start Date End Date Taking? Authorizing Provider  Iron-FA-B Cmp-C-Biot-Probiotic (FUSION PLUS) CAPS Take 1 capsule by mouth daily. Patient not taking: Reported on 07/20/2016 06/13/16   Joylene Igo, CNM  Prenatal Vit-Fe Fumarate-FA (PRENATAL VITAMINS) 28-0.8 MG TABS Take by mouth.    [provider]  Vitamin D, Ergocalciferol, (DRISDOL) 50000 units CAPS capsule Take 1 capsule (50,000 Units total) by mouth 2 (two) times a week. 07/21/16   Shambley,  Melody N, CNM    Allergies Ciprofloxacin; Penicillins; Shellfish allergy; and Cefaclor  Family History  Problem Relation Age of Onset  . Cancer Father        multiple Myeloma  . Rheum arthritis Maternal Grandmother   . Osteoarthritis Maternal Grandmother   . Breast cancer Maternal Grandmother 60  . Thyroid disease Maternal Grandmother   . Heart failure Maternal Grandfather   . Hyperlipidemia Maternal Grandfather   . Hypertension Maternal Grandfather   . Asthma Paternal Grandmother   . Stroke Paternal Grandfather   . Rheum arthritis Maternal Aunt   . Rheum arthritis Maternal Uncle     Social History Social History  Substance Use Topics  . Smoking status: Never Smoker  . Smokeless tobacco: Never Used  . Alcohol use 0.0 oz/week     Comment: on occasionally when not breast feeding or pregnancy    Review of Systems Constitutional: No fever/chills Eyes: No visual changes. ENT: No sore throat. Cardiovascular: Denies chest pain. Respiratory: Denies shortness of breath. Gastrointestinal: No abdominal pain.  No nausea, no vomiting.  No diarrhea.   Genitourinary: Negative for dysuria - had been treated for UTI a couple of weeks ago, that has resolved.  Musculoskeletal: Positive for right arm pain. Skin: Negative for rash. Neurological: Negative for headaches, focal weakness or numbness.  ____________________________________________   PHYSICAL EXAM:  VITAL SIGNS: ED Triage Vitals  Enc Vitals Group     BP 12/17/16 0234 130/86     Pulse Rate 12/17/16 0234 (!) 114  Resp 12/17/16 0234 18     Temp 12/17/16 0234 98.1 F (36.7 C)     Temp Source 12/17/16 0234 Oral     SpO2 12/17/16 0234 100 %     Weight 12/17/16 0234 155 lb (70.3 kg)     Height 12/17/16 0234 _0  (1.676 m)     Head Circumference --      Peak Flow --      Pain Score 12/17/16 0232 8   Constitutional: Alert and oriented. Well appearing and in no distress. Eyes: Conjunctivae are normal.  ENT    Head: Normocephalic and atraumatic.   Nose: No congestion/rhinnorhea.   Mouth/Throat: Mucous membranes are moist.   Neck: No stridor. Hematological/Lymphatic/Immunilogical: No cervical lymphadenopathy. Cardiovascular: Normal rate, regular rhythm.  No murmurs, rubs, or gallops.  Respiratory: Normal respiratory effort without tachypnea nor retractions. Breath sounds are clear and equal bilaterally. No wheezes/rales/rhonchi. Gastrointestinal: Soft and non tender. No rebound. No guarding.  Genitourinary: Deferred Musculoskeletal: No deformity to right arm.  Tenderness over right elbow and right wrist.  Neurologic:  Normal speech and language. No gross focal neurologic deficits are appreciated.  Skin:  Skin is warm, dry and intact. No rash noted. Psychiatric: Mood and affect are normal. Speech and behavior are normal. Patient exhibits appropriate insight and judgment.  ____________________________________________    LABS (pertinent positives/negatives)  Labs Reviewed  POCT PREGNANCY, URINE     ____________________________________________   EKG  None  ____________________________________________    RADIOLOGY  Right humerus IMPRESSION: Question of small fracture at the radial head. Would correlate for any associated symptoms. No elbow joint effusion seen, suggesting that this is artifactual in nature.  Right forearm IMPRESSION: No evidence of fracture or dislocation.  ____________________________________________   PROCEDURES  Procedures  POST SPLINT CHECK Right arm splint applied by tech.  Good position.  Distally N/V intact, sensation intact. No discoloration.  ____________________________________________   INITIAL IMPRESSION / ASSESSMENT AND PLAN / ED COURSE  Pertinent labs & imaging results that were available during my care of the patient were reviewed by me and considered in my medical decision making (see chart for details).  Patient  presented to the emergency department today because of concerns for right arm pain after a fall. Patient complain primarily pain in the right forearm. She is also having some pain to the right elbow. Initially right forearm films were obtained. This did not show any acute fracture. Patient then complained of the pain going up her right humerus. Right humeral x-ray showed possible fracture of the right radial head. She was tender over the radius and with manipulation of the right forearm. At this point will treat as a fracture of the right radial head. Will place patient in splint. Will have patient follow up with orthopedics.   ____________________________________________   FINAL CLINICAL IMPRESSION(S) / ED DIAGNOSES  Final diagnoses:  Closed nondisplaced fracture of head of right radius, initial encounter     Note: This dictation was prepared with Dragon dictation. Any transcriptional errors that result from this process are unintentional     Nance Pear, MD 12/17/16 [phone]

## 2016-12-17 NOTE — ED Notes (Signed)
Pt does not want any medication that cannot be taken while breastfeeding.

## 2016-12-28 ENCOUNTER — Ambulatory Visit: Payer: PRIVATE HEALTH INSURANCE | Attending: Orthopaedic Surgery | Admitting: Occupational Therapy

## 2016-12-28 DIAGNOSIS — M25622 Stiffness of left elbow, not elsewhere classified: Secondary | ICD-10-CM | POA: Insufficient documentation

## 2016-12-28 DIAGNOSIS — M25521 Pain in right elbow: Secondary | ICD-10-CM | POA: Insufficient documentation

## 2016-12-28 DIAGNOSIS — M6281 Muscle weakness (generalized): Secondary | ICD-10-CM | POA: Diagnosis not present

## 2016-12-28 NOTE — Therapy (Signed)
[place] [facility] [address] [place], [place], [address] Phone: [phone]   Fax:  [phone]  Occupational Therapy Evaluation  Patient Details  Name: Paige PriestRebecca Brown MRN: [id number] Date of Birth: [birth year] Referring Provider: Dr Stephenie AcresSoria  Encounter Date: 12/28/2016      OT End of Session - 12/28/16 1729    Visit Number 1   Number of Visits 12   Date for OT Re-Evaluation 02/22/17   OT Start Time 1337   OT Stop Time 1430   OT Time Calculation (min) 53 min   Activity Tolerance Patient tolerated treatment well   Behavior During Therapy [facility] for tasks assessed/performed      Past Medical History:  Diagnosis Date  . Anemia    with pregnancies  . GERD (gastroesophageal reflux disease)    during pregnancy  . UTI (lower urinary tract infection) 11/09/2015   2 more days to complete ATB    Past Surgical History:  Procedure Laterality Date  . CESAREAN SECTION    . TONSILLECTOMY  1994   and adnoids  . WISDOM TOOTH EXTRACTION     2000    There were no vitals filed for this visit.      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 Yes   Pain Score 4    Pain Location Elbow   Pain Orientation Right   Pain Descriptors / Indicators Aching   Pain Type Acute pain           [facility] OT Assessment - 12/28/16 0001      Assessment   Diagnosis R nondisplaced fracture of head of radius    Referring Provider Dr Stephenie AcresSoria   Onset Date 12/17/16     Balance Screen   Has the patient fallen in the past 6 months Yes   How many  times? 1   Has the patient had a decrease in activity level because of a fear of falling?  No   Is the patient reluctant to leave their home because of a fear of falling?  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 - R hand domimant - going back to school , has 3 little kids under age of 5      AROM   Right Elbow Flexion 135  140 after session    Right Elbow Extension -30  -5 to -10 end of session    Right Forearm Pronation 85 Degrees  compensate with shoulder ABD   Right Forearm Supination 90 Degrees      HEatingpad over elbow - x 2 and work on extention in supine and standing - 3 positions , and elbow flexion to chin, ear and back of neck  Increase see flowsheet   HEP  review   5 x day  AROM and AAROM to elbow and forearm  Elbow flexion and extention in 3 positions Sup and pronation - elbow to side  10 reps each  Heat prior and ice as needed after wards                OT Education - 12/28/16 1729    Education provided Yes   Education Details findings , rehab course and HEP    Person(s) Educated Patient   Methods Explanation;Demonstration;Tactile cues;Verbal cues;Handout   Comprehension Returned demonstration;Verbal cues required;Verbalized understanding          OT Short Term Goals - 12/28/16 1735      OT SHORT TERM GOAL #1   Title Pain for R elbow  AROM and use of R arm  in bathing and dressing improve to under 2-3/10    Baseline pain increase to 8/10 ,    Time 3   Period Weeks   Status New   Target Date 01/18/17     OT SHORT TERM GOAL #2   Title R elbow and forearm AROM improve to [facility] to reach and tie shoes , fix hair//collar  in back , ponytail    Baseline pronation 85 , elbow flexion 135 and extention -30 with pain 4/10 at best    Time 3   Period Weeks   Status New   Target Date 01/18/17           OT Long Term Goals - 12/28/16 1738       OT LONG TERM GOAL #1   Title Intiate strengthening for R elbow and forearm when okay by MD    Baseline less than 2 wks out from fx    Time 4   Period Weeks   Target Date 01/25/17     OT LONG TERM GOAL #2   Title R hand grip improve to more than 50% compare to L hand to pull and push more than 5 lbs    Baseline less than 2 wks out from injury    Time 6   Period Weeks   Status New   Target Date 02/08/17     OT LONG TERM GOAL #3   Title Function score on PREE be less than 10/50    Baseline assess next session - but cannot pick up/pull, lift or push  more than 1 lbs    Time 8   Period Weeks   Status New   Target Date 02/22/17               Plan - 12/28/16 1730    Clinical Impression Statement Pt just under 2 wks out of R radius head nondisplaced fx - pt report she wear sling around people , and can feel elbow gets stiff more - pain fluctuate from 4 to 8/10 per pt - pt show decrease elbow flexion , extention , pronation - with increase tightness over bicep with  increase pain - some bruising and swelling still present - pt denies numbness - pt on 1 lbs limit and returning back to work light duty  and sliing in place - pt is R hand domimant and lmited in performing ADL's and IADL;s - taking care of her 3 kids under age of 35 - pt can benefit from OT services    Occupational performance deficits (Please refer to evaluation for details): ADL's;IADL's;Rest and Sleep;Work;Play;Leisure   Rehab Potential Good   OT Frequency 2x / week   OT  Duration 8 weeks   OT Treatment/Interventions Moist Heat;Self-care/ADL training;Fluidtherapy;Patient/family education;Therapeutic exercises;Passive range of motion;Manual Therapy;Parrafin   Plan assess progress in HEP    Clinical Decision Making Several treatment options, min-mod task modification necessary   OT Home Exercise Plan see pt instruction   Consulted and Agree with Plan of Care Patient      Patient will benefit from skilled  therapeutic intervention in order to improve the following deficits and impairments:  Increased edema, Impaired flexibility, Pain, Decreased range of motion, Decreased strength, Impaired UE functional use  Visit Diagnosis: Pain in right elbow - Plan: Ot plan of care cert/re-cert  Stiffness of left elbow, not elsewhere classified - Plan: Ot plan of care cert/re-cert  Muscle weakness (generalized) - Plan: Ot plan of care cert/re-cert    Problem List Patient Active Problem List   Diagnosis Date Noted  . Labor and delivery, indication for care 06/11/2016  . Vaginal birth after cesarean (VBAC) 06/11/2016  . Vitamin D deficiency 04/12/2016  . Temporary low platelet count (HCC) 04/12/2016  . History of vaginal delivery following previous cesarean delivery 01/05/2016    Oletta Cohn OTR/L,CLT 12/28/2016, 5:45 PM  [place] [facility] [address], [place], [address] Phone: [phone]   Fax:  [phone]  Name: Paige Brown MRN: [id number] Date of Birth: 1986-04-18

## 2016-12-28 NOTE — Patient Instructions (Signed)
5 x day  AROM and AAROM to elbow and forearm  Elbow flexion and extention in 3 positions Sup and pronation - elbow to side  10 reps each  Heat prior and ice as needed after wards

## 2016-12-29 ENCOUNTER — Encounter: Payer: PRIVATE HEALTH INSURANCE | Admitting: Occupational Therapy

## 2017-01-05 ENCOUNTER — Ambulatory Visit: Payer: PRIVATE HEALTH INSURANCE | Attending: Orthopaedic Surgery | Admitting: Occupational Therapy

## 2017-01-05 DIAGNOSIS — M25622 Stiffness of left elbow, not elsewhere classified: Secondary | ICD-10-CM | POA: Diagnosis present

## 2017-01-05 DIAGNOSIS — M6281 Muscle weakness (generalized): Secondary | ICD-10-CM | POA: Insufficient documentation

## 2017-01-05 DIAGNOSIS — M25521 Pain in right elbow: Secondary | ICD-10-CM | POA: Insufficient documentation

## 2017-01-05 NOTE — Therapy (Signed)
[facility] [address] [place], [place], [address] Phone: [phone]   Fax:  [phone]  Occupational Therapy Treatment  Patient Details  Name: Deirdre PriestRebecca Sugg MRN: [id number] Date of Birth: [birth year] Referring Provider: Dr Stephenie AcresSoria  Encounter Date: 01/05/2017      OT End of Session - 01/05/17 1152    Visit Number 2   Number of Visits 12   Date for OT Re-Evaluation 02/22/17   OT Start Time 1038   OT Stop Time 1120   OT Time Calculation (min) 42 min   Activity Tolerance Patient tolerated treatment well   Behavior During Therapy [facility] for tasks assessed/performed      Past Medical History:  Diagnosis Date  . Anemia    with pregnancies  . GERD (gastroesophageal reflux disease)    during pregnancy  . UTI (lower urinary tract infection) 11/09/2015   2 more days to complete ATB    Past Surgical History:  Procedure Laterality Date  . CESAREAN SECTION    . TONSILLECTOMY  1994   and adnoids  . WISDOM TOOTH EXTRACTION     2000    There were no vitals filed for this visit.      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 Yes   Pain Score 1    Pain Location Elbow   Pain Orientation Right   Pain Descriptors / Indicators Aching   Pain Type Acute pain            [facility] OT Assessment - 01/05/17 0001      AROM   Right Elbow Flexion 140   Right Elbow Extension -8  0 to -5      Moist heat on elbow for end range   in 3 positions  AROM end range for all 3 positions   Graston tool on bicep - sweeping and brushing - some tightness  Pt tight in wrist and digits extention with elbow extertion - add Ulnar N  glide - can support upper arm  horizontal ABD and ADD to L shoulder -  Pt to work on end range extention of elbow - ball behind upper arm against wall - to increase extention of elbow  UBE for 2 min - change direction 1 min - no resistance working on flexion and extention  Scapula retraction add for mid and lower traps  10 reps              OT Treatments/Exercises (OP) - 01/05/17 0001      Manual Therapy   Manual therapy comments Graston tool nr 2 sweeping and brushing over bicep prior to ROM afte heat                 OT Education - 01/05/17 1152    Education provided Yes   Education Details Progress and changes to HEP    Person(s) Educated Patient   Methods Explanation;Demonstration;Tactile cues;Verbal cues   Comprehension Verbal cues required;Returned demonstration;Verbalized understanding          OT Short Term Goals - 12/28/16 1735  OT SHORT TERM GOAL #1   Title Pain for R elbow  AROM and use of R arm  in bathing and dressing improve to under 2-3/10    Baseline pain increase to 8/10 ,    Time 3   Period Weeks   Status New   Target Date 01/18/17     OT SHORT TERM GOAL #2   Title R elbow and forearm AROM improve to [facility] to reach and tie shoes , fix hair//collar  in back , ponytail    Baseline pronation 85 , elbow flexion 135 and extention -30 with pain 4/10 at best    Time 3   Period Weeks   Status New   Target Date 01/18/17           OT Long Term Goals - 12/28/16 1738      OT LONG TERM GOAL #1   Title Intiate strengthening for R elbow and forearm when okay by MD    Baseline less than 2 wks out from fx    Time 4   Period Weeks   Target Date 01/25/17     OT LONG TERM GOAL #2   Title R hand grip improve to more than 50% compare to L hand to pull and push more than 5 lbs    Baseline less than 2 wks out from injury    Time 6   Period Weeks   Status New   Target Date 02/08/17     OT LONG TERM GOAL #3   Title Function score on PREE be  less than 10/50    Baseline assess next session - but cannot pick up/pull, lift or push  more than 1 lbs    Time 8   Period Weeks   Status New   Target Date 02/22/17               Plan - 01/05/17 1153    Clinical Impression Statement Pt close to 30 wks from R radius head non displaced fx - pt showing great progress - still end range - last xray still showed non displaced - pain decrease to 1-4/1- and able to move faster between flexion and extentoin - stil in sling at work and not more than 1 lbs    Occupational performance deficits (Please refer to evaluation for details): ADL's;IADL's;Leisure;Play;Work   Editor, commissioning   OT Frequency 1x / week   OT Duration 6 weeks   OT Treatment/Interventions Moist Heat;Self-care/ADL training;Fluidtherapy;Patient/family education;Therapeutic exercises;Passive range of motion;Manual Therapy;Parrafin   Plan assess progress and update HEP as needed    Clinical Decision Making Several treatment options, min-mod task modification necessary   OT Home Exercise Plan see pt instruction      Patient will benefit from skilled therapeutic intervention in order to improve the following deficits and impairments:  Increased edema, Impaired flexibility, Pain, Decreased range of motion, Decreased strength, Impaired UE functional use  Visit Diagnosis: Pain in right elbow  Stiffness of left elbow, not elsewhere classified  Muscle weakness (generalized)    Problem List Patient Active Problem List   Diagnosis Date Noted  . Labor and delivery, indication for care 06/11/2016  . Vaginal birth after cesarean (VBAC) 06/11/2016  . Vitamin D deficiency 04/12/2016  . Temporary low platelet count (HCC) 04/12/2016  . History of vaginal delivery following previous cesarean delivery 01/05/2016    Oletta Cohn OTR/L,CLT 01/05/2017, 11:55 AM  [place] [facility] [address]  [place], [place],  [address] Phone: [phone]   Fax:  [phone]  Name: Zahlia Deshazer MRN: [id number] Date of Birth: 08-Aug-1985

## 2017-01-05 NOTE — Patient Instructions (Signed)
Same  But work on end range - put something behind upper arm  Ulnar N glide - incorporating  Wrist and digits extention - with elbow extention

## 2017-01-12 ENCOUNTER — Ambulatory Visit: Payer: PRIVATE HEALTH INSURANCE | Admitting: Occupational Therapy

## 2017-01-12 DIAGNOSIS — M25521 Pain in right elbow: Secondary | ICD-10-CM | POA: Diagnosis not present

## 2017-01-12 DIAGNOSIS — M6281 Muscle weakness (generalized): Secondary | ICD-10-CM

## 2017-01-12 DIAGNOSIS — M25622 Stiffness of left elbow, not elsewhere classified: Secondary | ICD-10-CM

## 2017-01-12 NOTE — Therapy (Signed)
[place] [facility] [address], [place], [address] Phone: [phone]   Fax:  [phone]  Occupational Therapy Treatment  Patient Details  Name: Paige Brown MRN: [id number] Date of Birth: 05/03/1985 Referring Provider: Dr Stephenie Acres  Encounter Date: 01/12/2017      OT End of Session - 01/12/17 1027    Visit Number 3   Number of Visits 12   Date for OT Re-Evaluation 02/22/17   OT Start Time 0946   OT Stop Time 1016   OT Time Calculation (min) 30 min   Activity Tolerance Patient tolerated treatment well   Behavior During Therapy [facility] for tasks assessed/performed      Past Medical History:  Diagnosis Date  . Anemia    with pregnancies  . GERD (gastroesophageal reflux disease)    during pregnancy  . UTI (lower urinary tract infection) 11/09/2015   2 more days to complete ATB    Past Surgical History:  Procedure Laterality Date  . CESAREAN SECTION    . TONSILLECTOMY  1994   and adnoids  . WISDOM TOOTH EXTRACTION     2000    There were no vitals filed for this visit.      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 Yes   Pain Score 2    Pain Location Elbow   Pain Orientation Right   Pain Descriptors / Indicators Aching;Tightness   Pain Type Acute pain       Elbow extention standing 0 , flexion 140 R  L flexion 147 Pronation and sup WNL - and with more ease this date                OT Treatments/Exercises (OP) - 01/12/17 0001      Moist Heat Therapy   Number Minutes Moist Heat 8 Minutes   Moist Heat Location --  prior to  manual to decrease stiffness      Soft tissue mobs using Graston tool nr 2 over bicep - sweeping , brushing,  Myofascial stretch prior to ROM - Elbow extention - 3 positions AROM  Flexion - 3 position  sup /pro 12 reps all  Stiffness and tightness more than pain per pt  Ice done at end  Pt to use ice at home to decrease edema in elbow - and tight /stiff feeling            OT Education - 01/12/17 1026    Education provided Yes   Education Details to do ice at the end of edema    Person(s) Educated Patient   Methods Explanation;Demonstration;Tactile cues;Verbal cues   Comprehension Verbal cues required;Returned demonstration;Verbalized understanding          OT Short Term Goals - 12/28/16 1735      OT SHORT TERM GOAL #1   Title Pain for R elbow  AROM and use of R arm  in bathing and dressing improve to under 2-3/10    Baseline pain increase to 8/10 ,  Time 3   Period Weeks   Status New   Target Date 01/18/17     OT SHORT TERM GOAL #2   Title R elbow and forearm AROM improve to [facility] to reach and tie shoes , fix hair//collar  in back , ponytail    Baseline pronation 85 , elbow flexion 135 and extention -30 with pain 4/10 at best    Time 3   Period Weeks   Status New   Target Date 01/18/17           OT Long Term Goals - 12/28/16 1738      OT LONG TERM GOAL #1   Title Intiate strengthening for R elbow and forearm when okay by MD    Baseline less than 2 wks out from fx    Time 4   Period Weeks   Target Date 01/25/17     OT LONG TERM GOAL #2   Title R hand grip improve to more than 50% compare to L hand to pull and push more than 5 lbs    Baseline less than 2 wks out from injury    Time 6   Period Weeks   Status New   Target Date 02/08/17     OT LONG TERM GOAL #3   Title Function score on PREE be less than 10/50    Baseline assess next session - but cannot pick up/pull, lift or push  more than 1 lbs    Time 8   Period Weeks   Status New   Target  Date 02/22/17               Plan - 01/12/17 1027    Clinical Impression Statement Great progress in ROM since last week - extention 0 and flexion 140 - some stiffness still and achiness - but increase edema notice - reinforce for pt to use ice - cont with ROM - will follow up next week    Occupational performance deficits (Please refer to evaluation for details): ADL's;IADL's;Rest and Sleep;Play;Leisure   Rehab Potential Good   OT Frequency 2x / week   OT Duration 6 weeks   OT Treatment/Interventions Moist Heat;Self-care/ADL training;Fluidtherapy;Patient/family education;Therapeutic exercises;Passive range of motion;Manual Therapy;Parrafin   Plan assess extention all positions and flexion - sup /pro -    Clinical Decision Making Several treatment options, min-mod task modification necessary   OT Home Exercise Plan see pt instruction   Consulted and Agree with Plan of Care Patient      Patient will benefit from skilled therapeutic intervention in order to improve the following deficits and impairments:  Increased edema, Impaired flexibility, Pain, Decreased range of motion, Decreased strength, Impaired UE functional use  Visit Diagnosis: Pain in right elbow  Stiffness of left elbow, not elsewhere classified  Muscle weakness (generalized)    Problem List Patient Active Problem List   Diagnosis Date Noted  . Labor and delivery, indication for care 06/11/2016  . Vaginal birth after cesarean (VBAC) 06/11/2016  . Vitamin D deficiency 04/12/2016  . Temporary low platelet count (HCC) 04/12/2016  . History of vaginal delivery following previous cesarean delivery 01/05/2016    Oletta CohnuPreez, Kalley Nicholl OTR/L,CLT 01/12/2017, 10:29 AM  [place] [facility] [address] [place], [place], [address] Phone: [phone]   Fax:  [phone]  Name: Paige Brown MRN: [id number] Date of Birth: 01/14/1986

## 2017-01-19 ENCOUNTER — Encounter: Payer: PRIVATE HEALTH INSURANCE | Admitting: Occupational Therapy

## 2017-01-20 ENCOUNTER — Ambulatory Visit: Payer: PRIVATE HEALTH INSURANCE | Admitting: Occupational Therapy

## 2017-01-20 DIAGNOSIS — M25622 Stiffness of left elbow, not elsewhere classified: Secondary | ICD-10-CM

## 2017-01-20 DIAGNOSIS — M25521 Pain in right elbow: Secondary | ICD-10-CM

## 2017-01-20 DIAGNOSIS — M6281 Muscle weakness (generalized): Secondary | ICD-10-CM

## 2017-01-20 NOTE — Patient Instructions (Signed)
Pt to work on end range for elbow extention in pronation - keep shoulder down  Tubi grip for compression to elbow - decrease edema - tightness

## 2017-01-20 NOTE — Therapy (Signed)
[facility] [address], [place], [address] Phone: [phone]   Fax:  [phone]  Occupational Therapy Treatment  Patient Details  Name: Paige Brown MRN: [id number] Date of Birth: [birth year] Referring Provider: Dr Stephenie Acres  Encounter Date: 01/20/2017      OT End of Session - 01/20/17 1227    Visit Number 4   Number of Visits 12   Date for OT Re-Evaluation 02/22/17   OT Start Time 1123   OT Stop Time 1155   OT Time Calculation (min) 32 min   Activity Tolerance Patient tolerated treatment well   Behavior During Therapy [facility] for tasks assessed/performed      Past Medical History:  Diagnosis Date  . Anemia    with pregnancies  . GERD (gastroesophageal reflux disease)    during pregnancy  . UTI (lower urinary tract infection) 11/09/2015   2 more days to complete ATB    Past Surgical History:  Procedure Laterality Date  . CESAREAN SECTION    . TONSILLECTOMY  1994   and adnoids  . WISDOM TOOTH EXTRACTION     2000    There were no vitals filed for this visit.      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 No/denies            Flexion of elbow same as L - reaching behind head  Extention same as L - except in pronation  Sup and pro WNL          OT Treatments/Exercises (OP) - 01/20/17 0001      Moist Heat Therapy   Number Minutes Moist Heat 8 Minutes   Moist Heat Location Elbow  for end range extention in pronation position       Soft tissue mobs using Graston tool nr 2 over  bicep - sweeping , brushing,  Myofascial stretch prior to ROM - Elbow extention - 3 positions  PROM and AROM  Flexion - 3 position  sup /pro 12 reps all  Fitted with tubi grip E to wear at home and sleeping to decrease edema and tightness in elbow           OT Education - 01/20/17 1227    Education provided Yes   Education Details HEP for end range - keeping shoulder back    Person(s) Educated Patient   Methods Explanation;Demonstration;Tactile cues;Verbal cues   Comprehension Returned demonstration;Verbalized understanding          OT Short Term Goals - 01/20/17 1229      OT SHORT TERM GOAL #1   Title Pain for R elbow  AROM and use of R arm  in bathing and dressing improve to under 2-3/10    Status Achieved     OT SHORT TERM GOAL #2   Title R elbow and forearm AROM improve to [facility] to reach and tie shoes , fix hair//collar  in back , ponytail  Status Achieved           OT Long Term Goals - 01/20/17 1229      OT LONG TERM GOAL #1   Title Intiate strengthening for R elbow and forearm when okay by MD    Baseline Pt appt with MD next week - will be 6 wks    Time 2   Period Weeks   Status On-going   Target Date 02/03/17     OT LONG TERM GOAL #2   Title R hand grip improve to more than 50% compare to L hand to pull and push more than 5 lbs    Baseline await MD to okay   Time 4   Period Weeks   Status On-going   Target Date 02/17/17     OT LONG TERM GOAL #3   Title Function score on PREE be less than 10/50    Baseline assess next session - but cannot pick up/pull, lift or push  more than 1 lbs    Time 4   Period Weeks   Status On-going   Target Date 02/17/17               Plan - 01/20/17 1227    Clinical Impression Statement Pt made great progress in elbow flexion and extention - end range for extention in pronation - compensate with shoulder - pt to focus on end range and pain free - and follow up with MD  next week    Occupational performance  deficits (Please refer to evaluation for details): ADL's;IADL's;Rest and Sleep;Play;Leisure   Rehab Potential Good   OT Frequency 1x / week   OT Duration 4 weeks   OT Treatment/Interventions Moist Heat;Self-care/ADL training;Fluidtherapy;Patient/family education;Therapeutic exercises;Passive range of motion;Manual Therapy;Parrafin   Plan pt to phone after MD visit - about POC    Clinical Decision Making Limited treatment options, no task modification necessary   OT Home Exercise Plan see pt instruction   Consulted and Agree with Plan of Care Patient      Patient will benefit from skilled therapeutic intervention in order to improve the following deficits and impairments:  Increased edema, Impaired flexibility, Pain, Decreased range of motion, Decreased strength, Impaired UE functional use  Visit Diagnosis: Pain in right elbow  Stiffness of left elbow, not elsewhere classified  Muscle weakness (generalized)    Problem List Patient Active Problem List   Diagnosis Date Noted  . Labor and delivery, indication for care 06/11/2016  . Vaginal birth after cesarean (VBAC) 06/11/2016  . Vitamin D deficiency 04/12/2016  . Temporary low platelet count (HCC) 04/12/2016  . History of vaginal delivery following previous cesarean delivery 01/05/2016    Oletta Cohn OTR/L,CLT 01/20/2017, 12:31 PM  [facility] [address] [place], [address] Phone: [phone]   Fax:  [phone]  Name: Paige Brown MRN: [id number] Date of Birth: 09-Apr-1986

## 2017-02-20 ENCOUNTER — Telehealth: Payer: PRIVATE HEALTH INSURANCE | Admitting: Family

## 2017-02-20 ENCOUNTER — Telehealth: Payer: Self-pay | Admitting: Obstetrics and Gynecology

## 2017-02-20 ENCOUNTER — Ambulatory Visit
Admission: EM | Admit: 2017-02-20 | Discharge: 2017-02-20 | Disposition: A | Discharge status: Home / Self Care | Payer: 59 | Attending: Family Medicine | Admitting: Family Medicine

## 2017-02-20 DIAGNOSIS — R399 Unspecified symptoms and signs involving the genitourinary system: Secondary | ICD-10-CM

## 2017-02-20 DIAGNOSIS — R350 Frequency of micturition: Secondary | ICD-10-CM

## 2017-02-20 DIAGNOSIS — R300 Dysuria: Secondary | ICD-10-CM | POA: Diagnosis not present

## 2017-02-20 DIAGNOSIS — N390 Urinary tract infection, site not specified: Secondary | ICD-10-CM | POA: Diagnosis not present

## 2017-02-20 DIAGNOSIS — Z3200 Encounter for pregnancy test, result unknown: Secondary | ICD-10-CM

## 2017-02-20 DIAGNOSIS — Z3202 Encounter for pregnancy test, result negative: Secondary | ICD-10-CM | POA: Diagnosis not present

## 2017-02-20 LAB — URINALYSIS, COMPLETE (UACMP) WITH MICROSCOPIC
Bilirubin Urine: NEGATIVE
GLUCOSE, UA: NEGATIVE mg/dL
Ketones, ur: NEGATIVE mg/dL
Nitrite: NEGATIVE
PH: 6 (ref 5.0–8.0)
PROTEIN: 30 mg/dL — AB
Specific Gravity, Urine: 1.025 (ref 1.005–1.030)

## 2017-02-20 LAB — PREGNANCY, URINE: Preg Test, Ur: NEGATIVE

## 2017-02-20 MED ORDER — SULFAMETHOXAZOLE-TRIMETHOPRIM 800-160 MG PO TABS: 1.0000 | ORAL_TABLET | Freq: Two times a day (BID) | ORAL | 0 refills | Status: AC

## 2017-02-20 NOTE — Progress Notes (Signed)
See previous Evisit 

## 2017-02-20 NOTE — ED Provider Notes (Addendum)
[facility] ____________________________________________  Time seen: Approximately 3710 PM  I have reviewed the triage vital signs and the nursing notes.   HISTORY  Chief Complaint  Urinary Frequency   HPI Paige Brown is a 31 y.o. female presenting for evaluation of urinary frequency, urinary urgency and some burning with urination present since this morning. States frequent need to urinate, but denies any pain outside of urination. Denies abdominal pain, back pain, fevers, vomiting, vaginal discharge, vaginal discomfort. Reports continues to eat and drink well. States 9 months postpartum and still supplementally breast-feeds. Denies concerns pregnancy. States most recent UTI was approximately 2 months ago seen in urgent care, positive for Escherichia coli in urine, no resistance to any antibiotics. States his last treated with Macrobid. States believes recent trigger was recent bubble bath and not urinating after. Denies other recent trigger.Denies chest pain, shortness of breath, abdominal pain, or rash. Denies recent sickness. Denies recent antibiotic use.    Past Medical History:  Diagnosis Date  . Anemia    with pregnancies  . GERD (gastroesophageal reflux disease)    during pregnancy  . UTI (lower urinary tract infection) 11/09/2015   2 more days to complete ATB    Patient Active Problem List   Diagnosis Date Noted  . Labor and delivery, indication for care 06/11/2016  . Vaginal birth after cesarean (VBAC) 06/11/2016  . Vitamin D deficiency 04/12/2016  . Temporary low platelet count ([place]) 04/12/2016  . History of vaginal delivery following previous cesarean delivery 01/05/2016    Past Surgical History:  Procedure Laterality Date  . CESAREAN SECTION    . TONSILLECTOMY  1994   and adnoids  . WISDOM TOOTH EXTRACTION     2000     No current facility-administered medications for this encounter.   Current Outpatient Prescriptions:  .  HYDROmorphone  (DILAUDID) 2 MG tablet, Take 0.5 tablets (1 mg total) by mouth every 4 (four) hours as needed for severe pain., Disp: 20 tablet, Rfl: 0 .  Iron-FA-B Cmp-C-Biot-Probiotic (FUSION PLUS) CAPS, Take 1 capsule by mouth daily. (Patient not taking: Reported on 07/20/2016), Disp: 60 capsule, Rfl: 1 .  Prenatal Vit-Fe Fumarate-FA (PRENATAL VITAMINS) 28-0.8 MG TABS, Take by mouth., Disp: , Rfl:  .  sulfamethoxazole-trimethoprim (BACTRIM DS,SEPTRA DS) 800-160 MG tablet, Take 1 tablet by mouth 2 (two) times daily., Disp: 10 tablet, Rfl: 0 .  Vitamin D, Ergocalciferol, (DRISDOL) 50000 units CAPS capsule, Take 1 capsule (50,000 Units total) by mouth 2 (two) times a week., Disp: 30 capsule, Rfl: 1  Allergies Ciprofloxacin; Penicillins; Shellfish allergy; and Cefaclor  Family History  Problem Relation Age of Onset  . Cancer Father        multiple Myeloma  . Rheum arthritis Maternal Grandmother   . Osteoarthritis Maternal Grandmother   . Breast cancer Maternal Grandmother 60  . Thyroid disease Maternal Grandmother   . Heart failure Maternal Grandfather   . Hyperlipidemia Maternal Grandfather   . Hypertension Maternal Grandfather   . Asthma Paternal Grandmother   . Stroke Paternal Grandfather   . Rheum arthritis Maternal Aunt   . Rheum arthritis Maternal Uncle     Social History Social History  Substance Use Topics  . Smoking status: Never Smoker  . Smokeless tobacco: Never Used  . Alcohol use 0.0 oz/week     Comment: on occasionally when not breast feeding or pregnancy    Review of Systems Constitutional: No fever/chills Cardiovascular: Denies chest pain. Respiratory: Denies shortness of breath. Gastrointestinal: No abdominal pain.  No nausea, no vomiting Genitourinary: Positive for dysuria. Musculoskeletal: Negative for back pain. Skin: Negative for rash.   ____________________________________________   PHYSICAL EXAM:  VITAL SIGNS: ED Triage Vitals  Enc Vitals Group     BP  02/20/17 1227 131/85     Pulse Rate 02/20/17 1227 92     Resp 02/20/17 1227 16     Temp 02/20/17 1227 97.7 F (36.5 C)     Temp Source 02/20/17 1227 Oral     SpO2 02/20/17 1227 100 %     Weight 02/20/17 1228 160 lb (72.6 kg)     Height 02/20/17 1228 '5\' 5"'  (1.651 m)     Head Circumference --      Peak Flow --      Pain Score 02/20/17 1301 2     Pain Loc --      Pain Edu? --      Excl. in [place]? --     Constitutional: Alert and oriented. Well appearing and in no acute distress. Cardiovascular: Normal rate, regular rhythm. Grossly normal heart sounds.  Good peripheral circulation. Respiratory: Normal respiratory effort without tachypnea nor retractions. Breath sounds are clear and equal bilaterally. No wheezes, rales, rhonchi. Gastrointestinal: Soft and nontender. No CVA tenderness. Musculoskeletal:  No midline cervical, thoracic or lumbar tenderness to palpation.  Neurologic:  Normal speech and language.Speech is normal. No gait instability.  Skin:  Skin is warm, dry Psychiatric: Mood and affect are normal. Speech and behavior are normal. Patient exhibits appropriate insight and judgment   ___________________________________________   LABS (all labs ordered are listed, but only abnormal results are displayed)  Labs Reviewed  URINALYSIS, COMPLETE (UACMP) WITH MICROSCOPIC - Abnormal; Notable for the following:       Result Value   APPearance CLOUDY (*)    Hgb urine dipstick MODERATE (*)    Protein, ur 30 (*)    Leukocytes, UA LARGE (*)    Squamous Epithelial / LPF 0-5 (*)    Bacteria, UA MANY (*)    All other components within normal limits  PREGNANCY, URINE   PROCEDURES Procedures    INITIAL IMPRESSION / ASSESSMENT AND PLAN / ED COURSE  Pertinent labs & imaging results that were available during my care of the patient were reviewed by me and considered in my medical decision making (see chart for details).  Appearing patient. No acute distress. Dysuria 1 day. Urinalysis  reviewed, UTI. Declines culture. Urine pregnancy negative. Most recently treated with Macrobid. Will treat patient with oral Bactrim, counseled regarding excretion through breast milk. Encouraged rest, fluids and follow-up.Discussed indication, risks and benefits of medications with patient.  Discussed follow up with Primary care physician this week. Discussed follow up and return parameters including no resolution or any worsening concerns. Patient verbalized understanding and agreed to plan.   ____________________________________________   FINAL CLINICAL IMPRESSION(S) / ED DIAGNOSES  Final diagnoses:  Urinary tract infection without hematuria, site unspecified     Discharge Medication List as of 02/20/2017 12:59 PM    START taking these medications   Details  sulfamethoxazole-trimethoprim (BACTRIM DS,SEPTRA DS) 800-160 MG tablet Take 1 tablet by mouth 2 (two) times daily., Starting Mon 02/20/2017, Until Sat 02/25/2017, Normal        Note: This dictation was prepared with Dragon dictation along with smaller phrase technology. Any transcriptional errors that result from this process are unintentional.         Marylene Land, NP 02/20/17 1517    Marylene Land, NP 02/20/17 1520

## 2017-02-20 NOTE — Telephone Encounter (Signed)
Patient lvm for a call back I called the patient back and lvm for patient to call the office back for assistance. Thank you.

## 2017-02-20 NOTE — Discharge Instructions (Signed)
Take medication as prescribed. Rest. Drink plenty of fluids.  ° °Follow up with your primary care physician this week as needed. Return to Urgent care for new or worsening concerns.  ° °

## 2017-02-20 NOTE — Progress Notes (Signed)
Based on what you shared with me it looks like you have a serious condition that should be evaluated in a face to face office visit.  NOTE: Even if you have entered your credit card information for this eVisit, you will not be charged.   If you are having a true medical emergency please call 911.  If you need an urgent face to face visit, [place] has four urgent care centers for your convenience.  If you need care fast and have a high deductible or no insurance consider:   [email or web address]  [phone]  [address], [address] [place], [place] [address] 8 am to 8 pm Monday-Friday 10 am to 4 pm Saturday-Sunday   The following sites will take your  insurance:    . [facility]  [phone] Get Driving Directions Find a Provider at this Location  [address] [place], [place] [address] . 10 am to 8 pm Monday-Friday . 12 pm to 8 pm Saturday-Sunday   . [facility]  [phone] Get Driving Directions Find a Provider at this Location  [address], [address] [place], [place] [address] . 8 am to 8 pm Monday-Friday . 9 am to 6 pm Saturday . 11 am to 6 pm Sunday   . [facility] [place]  [phone] Get Driving Directions  [address] [place], [place] [address] . 8 am to 8 pm Monday-Friday . 8 am to 4 pm Saturday-Sunday   Your e-visit answers were reviewed by a board certified advanced clinical practitioner to complete your personal care plan.  Thank you for using e-Visits.  

## 2017-02-20 NOTE — ED Triage Notes (Signed)
Pt reports urinary urgency and frequency starting this a.m.

## 2017-05-02 NOTE — L&D Delivery Note (Signed)
Delivery Note  1045 In room to see patient, reports pelvic pressure with the urge to push. SVE: 10/100/+1, vertex. Effective maternal pushing efforts noted.   Spontaneous vaginal birth of liveborn female infant in compound left occiput anterior position at 1051. Infant immediately to maternal abdomen. Delayed cord clamping of three (3) vessel cord. Skin to skin. APGARS: 9, 9. Weight pending. Receiving nurse present at bedside for birth. Gestational age: [id number].  Pitocin infusing. Spontaneous delivery of intact placenta at 1057. First degree perineal laceration, hemostatic unrepaired. Uterus firm. Lochia small. EBL: 250 ml. Counts correct x 2. Vault check completed.   Initiate routine postpartum care and orders. Mom to postpartum.  Baby to Couplet care / Skin to Skin.  Gunnar BullaJenkins Michelle Uzair Godley, CNM [facility], [facility] 03/16/2018, 11:49 AM

## 2017-05-03 ENCOUNTER — Ambulatory Visit: Payer: 59 | Admitting: Obstetrics and Gynecology

## 2017-06-01 ENCOUNTER — Encounter: Payer: 59 | Admitting: Obstetrics and Gynecology

## 2017-07-19 ENCOUNTER — Telehealth: Payer: Self-pay | Admitting: Obstetrics and Gynecology

## 2017-07-19 NOTE — Telephone Encounter (Signed)
The patient called and stated that she has several questions for her nurse and she would like a call back. 1. The patient needs documentation of her T-Dap injection being submitted for educational purposes, and 2. The patient recently found out that she is pregnant and is wanting to know if she should keep her next appointment or come in sooner to see her provider. No other information was disclosed. Please advise.

## 2017-07-19 NOTE — Telephone Encounter (Signed)
appt made, tdap info put up front

## 2017-08-01 ENCOUNTER — Encounter: Payer: Self-pay | Admitting: Obstetrics and Gynecology

## 2017-08-01 ENCOUNTER — Ambulatory Visit (INDEPENDENT_AMBULATORY_CARE_PROVIDER_SITE_OTHER): Payer: 59 | Admitting: Obstetrics and Gynecology

## 2017-08-01 VITALS — BP 122/80 | HR 98 | Ht 66.0 in | Wt 162.1 lb

## 2017-08-01 DIAGNOSIS — N926 Irregular menstruation, unspecified: Secondary | ICD-10-CM

## 2017-08-01 LAB — POCT URINE PREGNANCY: Preg Test, Ur: POSITIVE — AB

## 2017-08-01 MED ORDER — ONDANSETRON 4 MG PO TBDP: 4.0000 mg | ORAL_TABLET | Freq: Four times a day (QID) | ORAL | 2 refills | Status: DC | PRN

## 2017-08-01 NOTE — Progress Notes (Signed)
Subjective:     Patient ID: Paige PriestRebecca Rouillard, female   DOB: Nov 17, 1985, 32 y.o.   MRN: [id number]  HPI Here for pregnancy confirmation. Unsure LMP (sometime around early Feb) as she is still breast feeding 32 yo. [id number] with c/s followed by VBAC. Home UPT+ 2 weeks ago. Has been nauseated for 2 weeks. Request zofran RX  Review of Systems Negative     Objective:   Physical Exam A&Ox4 Well groomed female in no distress Blood pressure 122/80, pulse 98, height 5\' 6"  (1.676 m), weight 162 lb 1.6 oz (73.5 kg), currently breastfeeding.  Body mass index is 26.16 kg/m.  UPT+ Pelvic exam: normal external genitalia, vulva, vagina, cervix, uterus and adnexa, UTERUS: enlarged to 8 week's size.    Assessment:     Missed menses Breastfeeding Nausea     Plan:    Zofran sent in. Will return for dating scan and nurse intake/labs. Then at 11 weeks for NOB PE.  Melody Shambley,CNM

## 2017-08-03 ENCOUNTER — Ambulatory Visit: Payer: 59

## 2017-08-03 ENCOUNTER — Ambulatory Visit (INDEPENDENT_AMBULATORY_CARE_PROVIDER_SITE_OTHER): Payer: 59

## 2017-08-03 DIAGNOSIS — N926 Irregular menstruation, unspecified: Secondary | ICD-10-CM

## 2017-08-17 ENCOUNTER — Other Ambulatory Visit: Payer: 59

## 2017-08-22 ENCOUNTER — Other Ambulatory Visit: Payer: 59

## 2017-08-22 DIAGNOSIS — N926 Irregular menstruation, unspecified: Secondary | ICD-10-CM

## 2017-08-23 LAB — BETA HCG QUANT (REF LAB): hCG Quant: 97693 m[IU]/mL

## 2017-08-24 ENCOUNTER — Encounter: Payer: 59 | Admitting: Obstetrics and Gynecology

## 2017-08-28 ENCOUNTER — Ambulatory Visit: Payer: 59 | Admitting: Obstetrics and Gynecology

## 2017-08-28 VITALS — BP 126/84 | HR 125 | Wt 162.5 lb

## 2017-08-28 DIAGNOSIS — Z3483 Encounter for supervision of other normal pregnancy, third trimester: Secondary | ICD-10-CM

## 2017-08-28 NOTE — Progress Notes (Signed)
Paige Brown presents for NOB nurse interview visit. Pregnancy confirmation done _4/2/19 [facility]_____.  G-4 .  P3 0 0 3-    . Pregnancy education material explained and given. __0_ cats in the home. NOB labs ordered.  HIV labs and Drug screen were explained optional and she did not decline. Drug screen ordered. PNV encouraged. Genetic screening options discussed. Genetic testing: Ordered.  Pt may discuss with provider. Pt. To follow up with provider in _3_ weeks for NOB physical.  All questions answered.

## 2017-09-04 ENCOUNTER — Telehealth: Payer: Self-pay | Admitting: Obstetrics and Gynecology

## 2017-09-04 NOTE — Telephone Encounter (Signed)
The patient called and stated that she would like to speak with her nurse in regards to her having some questions about genetic testing. Please advise.

## 2017-09-06 ENCOUNTER — Encounter: Payer: Self-pay | Admitting: *Deleted

## 2017-09-06 NOTE — Telephone Encounter (Signed)
Sent pt mychart message

## 2017-09-15 ENCOUNTER — Ambulatory Visit (INDEPENDENT_AMBULATORY_CARE_PROVIDER_SITE_OTHER): Payer: 59 | Admitting: Obstetrics and Gynecology

## 2017-09-15 ENCOUNTER — Encounter: Payer: Self-pay | Admitting: Obstetrics and Gynecology

## 2017-09-15 VITALS — BP 127/85 | HR 103 | Wt 163.8 lb

## 2017-09-15 DIAGNOSIS — Z3492 Encounter for supervision of normal pregnancy, unspecified, second trimester: Secondary | ICD-10-CM | POA: Diagnosis not present

## 2017-09-15 LAB — POCT URINALYSIS DIPSTICK
Bilirubin, UA: NEGATIVE
GLUCOSE UA: NEGATIVE
Ketones, UA: NEGATIVE
LEUKOCYTES UA: NEGATIVE
NITRITE UA: NEGATIVE
PROTEIN UA: NEGATIVE
RBC UA: NEGATIVE
Spec Grav, UA: 1.015 (ref 1.010–1.025)
Urobilinogen, UA: 0.2 E.U./dL
pH, UA: 6 (ref 5.0–8.0)

## 2017-09-15 NOTE — Progress Notes (Signed)
NOB- pt is doing well, having nausea trying to deal with it

## 2017-09-15 NOTE — Patient Instructions (Signed)
Second Trimester of Pregnancy The second trimester is from week 13 through week 28, month 4 through 6. This is often the time in pregnancy that you feel your best. Often times, morning sickness has lessened or quit. You may have more energy, and you may get hungry more often. Your unborn baby (fetus) is growing rapidly. At the end of the sixth month, he or she is about 9 inches long and weighs about 1 pounds. You will likely feel the baby move (quickening) between 18 and 20 weeks of pregnancy. Follow these instructions at home:  Avoid all smoking, herbs, and alcohol. Avoid drugs not approved by your doctor.  Do not use any tobacco products, including cigarettes, chewing tobacco, and electronic cigarettes. If you need help quitting, ask your doctor. You may get counseling or other support to help you quit.  Only take medicine as told by your doctor. Some medicines are safe and some are not during pregnancy.  Exercise only as told by your doctor. Stop exercising if you start having cramps.  Eat regular, healthy meals.  Wear a good support bra if your breasts are tender.  Do not use hot tubs, steam rooms, or saunas.  Wear your seat belt when driving.  Avoid raw meat, uncooked cheese, and liter boxes and soil used by cats.  Take your prenatal vitamins.  Take 1500-2000 milligrams of calcium daily starting at the 20th week of pregnancy until you deliver your baby.  Try taking medicine that helps you poop (stool softener) as needed, and if your doctor approves. Eat more fiber by eating fresh fruit, vegetables, and whole grains. Drink enough fluids to keep your pee (urine) clear or pale yellow.  Take warm water baths (sitz baths) to soothe pain or discomfort caused by hemorrhoids. Use hemorrhoid cream if your doctor approves.  If you have puffy, bulging veins (varicose veins), wear support hose. Raise (elevate) your feet for 15 minutes, 3-4 times a day. Limit salt in your diet.  Avoid heavy  lifting, wear low heals, and sit up straight.  Rest with your legs raised if you have leg cramps or low back pain.  Visit your dentist if you have not gone during your pregnancy. Use a soft toothbrush to brush your teeth. Be gentle when you floss.  You can have sex (intercourse) unless your doctor tells you not to.  Go to your doctor visits. Get help if:  You feel dizzy.  You have mild cramps or pressure in your lower belly (abdomen).  You have a nagging pain in your belly area.  You continue to feel sick to your stomach (nauseous), throw up (vomit), or have watery poop (diarrhea).  You have bad smelling fluid coming from your vagina.  You have pain with peeing (urination). Get help right away if:  You have a fever.  You are leaking fluid from your vagina.  You have spotting or bleeding from your vagina.  You have severe belly cramping or pain.  You lose or gain weight rapidly.  You have trouble catching your breath and have chest pain.  You notice sudden or extreme puffiness (swelling) of your face, hands, ankles, feet, or legs.  You have not felt the baby move in over an hour.  You have severe headaches that do not go away with medicine.  You have vision changes. This information is not intended to replace advice given to you by your health care provider. Make sure you discuss any questions you have with your health care   provider. Document Released: 07/13/2009 Document Revised: 09/24/2015 Document Reviewed: 06/19/2012 Elsevier Interactive Patient Education  2017 [employer]  

## 2017-09-15 NOTE — Progress Notes (Signed)
NEW OB HISTORY AND PHYSICAL  SUBJECTIVE:       Paige Brown is a 32 y.o. [phone] female, No LMP recorded. Patient is pregnant., Estimated Date of Delivery: 03/30/18, [redacted]w[redacted]d presents today for establishment of Prenatal Care. She has no unusual complaints and complains of nausea, cold intolerance      Gynecologic History No LMP recorded. Patient is pregnant. Unknown Contraception: none Last Pap: 2016. Results were: normal  Obstetric History OB History  Gravida Para Term Preterm AB Living  '4 3 3     3  ' SAB TAB Ectopic Multiple Live Births        0 3    # Outcome Date GA Lbr Len/2nd Weight Sex Delivery Anes PTL Lv  4 Current           3 Term 06/11/16 367w3d 00:41 6 lb 14 oz (3.118 kg) M Vag-Spont None  LIV  2 Term 09/11/14 3943w1d lb 5 oz (3.771 kg) M   Y LIV  1 Term 10/14/11 [id number] 10 oz (3.459 kg) F CS-Unspec  N LIV    Obstetric Comments  1st pregnancy-breech  2nd pregnancy: PTL at 35 wks./ VBAC- no assistance    Past Medical History:  Diagnosis Date  . Anemia    with pregnancies  . GERD (gastroesophageal reflux disease)    during pregnancy  . UTI (lower urinary tract infection) 11/09/2015   2 more days to complete ATB    Past Surgical History:  Procedure Laterality Date  . CESAREAN SECTION    . TONSILLECTOMY  1994   and adnoids  . WISDOM TOOTH EXTRACTION     2000    Current Outpatient Medications on File Prior to Visit  Medication Sig Dispense Refill  . ondansetron (ZOFRAN ODT) 4 MG disintegrating tablet Take 1 tablet (4 mg total) by mouth every 6 (six) hours as needed for nausea. 40 tablet 2  . pantoprazole (PROTONIX) 40 MG tablet Take 40 mg by mouth daily.    . Prenatal Vit-Fe Fumarate-FA (PRENATAL VITAMINS) 28-0.8 MG TABS Take by mouth.    . Iron-FA-B Cmp-C-Biot-Probiotic (FUSION PLUS) CAPS Take 1 capsule by mouth daily. (Patient not taking: Reported on 07/20/2016) 60 capsule 1  . Vitamin D, Ergocalciferol, (DRISDOL) 50000 units CAPS capsule Take 1  capsule (50,000 Units total) by mouth 2 (two) times a week. (Patient not taking: Reported on 08/01/2017) 30 capsule 1   No current facility-administered medications on file prior to visit.     Allergies  Allergen Reactions  . Ciprofloxacin Nausea And Vomiting  . Penicillins Hives  . Shellfish Allergy Swelling  . Cefaclor Hives    Social History   Socioeconomic History  . Marital status: Married    Spouse name: Not on file  . Number of children: Not on file  . Years of education: Not on file  . Highest education level: Not on file  Occupational History  . [place]ation[place]  Social Needs  . Financial resource strain: Not on file  . Food insecurity:    Worry: Not on file    Inability: Not on file  . Transportation needs:    Medical: Not on file    Non-medical: Not on file  Tobacco Use  . Smoking status: Never Smoker  . Smokeless tobacco: Never Used  Substance and Sexual Activity  . Alcohol use: Not Currently    Alcohol/week: 0.0 oz    Comment:  on occasionally when not breast feeding or pregnancy  . Drug use: No  . Sexual activity: Yes    Partners: Male    Birth control/protection: Condom, None  Lifestyle  . Physical activity:    Days per week: Not on file    Minutes per session: Not on file  . Stress: Not on file  Relationships  . Social connections:    Talks on phone: Not on file    Gets together: Not on file    Attends religious service: Not on file    Active member of club or organization: Not on file    Attends meetings of clubs or organizations: Not on file    Relationship status: Not on file  . Intimate partner violence:    Fear of current or ex partner: Not on file    Emotionally abused: Not on file    Physically abused: Not on file    Forced sexual activity: Not on file  Other Topics Concern  . Not on file  Social History Narrative  . Not on file    Family History  Problem Relation Age of Onset  . Cancer  Father        multiple Myeloma  . Rheum arthritis Maternal Grandmother   . Osteoarthritis Maternal Grandmother   . Breast cancer Maternal Grandmother 60  . Thyroid disease Maternal Grandmother   . Heart failure Maternal Grandfather   . Hyperlipidemia Maternal Grandfather   . Hypertension Maternal Grandfather   . Asthma Paternal Grandmother   . Stroke Paternal Grandfather   . Rheum arthritis Maternal Aunt   . Rheum arthritis Maternal Uncle     The following portions of the patient's history were reviewed and updated as appropriate: allergies, current medications, past OB history, past medical history, past surgical history, past family history, past social history, and problem list.    OBJECTIVE: Initial Physical Exam (New OB)  GENERAL APPEARANCE: alert, well appearing, in no apparent distress, oriented to person, place and time HEAD: normocephalic, atraumatic MOUTH: mucous membranes moist, pharynx normal without lesions and dental hygiene good THYROID: no thyromegaly or masses present BREASTS: no masses noted, no significant tenderness, no palpable axillary nodes, no skin changes, patient declined exam LUNGS: clear to auscultation, no wheezes, rales or rhonchi, symmetric air entry HEART: regular rate and rhythm, no murmurs ABDOMEN: soft, nontender, nondistended, no abnormal masses, no epigastric pain and fundus not palpable EXTREMITIES: no redness or tenderness in the calves or thighs SKIN: normal coloration and turgor, no rashes LYMPH NODES: no adenopathy palpable NEUROLOGIC: alert, oriented, normal speech, no focal findings or movement disorder noted  PELVIC EXAM not indicated  ASSESSMENT: Normal pregnancy H/o vitamin D deficiency   PLAN: Prenatal care Desires genetic screening Vit D checked. See orders

## 2017-09-16 LAB — CBC WITH DIFFERENTIAL/PLATELET
BASOS ABS: 0 10*3/uL (ref 0.0–0.2)
Basos: 0 %
EOS (ABSOLUTE): 0 10*3/uL (ref 0.0–0.4)
EOS: 1 %
HEMATOCRIT: 37.2 % (ref 34.0–46.6)
HEMOGLOBIN: 12.8 g/dL (ref 11.1–15.9)
IMMATURE GRANS (ABS): 0 10*3/uL (ref 0.0–0.1)
IMMATURE GRANULOCYTES: 0 %
LYMPHS: 20 %
Lymphocytes Absolute: 0.9 10*3/uL (ref 0.7–3.1)
MCH: 31.1 pg (ref 26.6–33.0)
MCHC: 34.4 g/dL (ref 31.5–35.7)
MCV: 90 fL (ref 79–97)
MONOCYTES: 6 %
Monocytes Absolute: 0.3 10*3/uL (ref 0.1–0.9)
Neutrophils Absolute: 3.1 10*3/uL (ref 1.4–7.0)
Neutrophils: 73 %
Platelets: 137 10*3/uL — ABNORMAL LOW (ref 150–379)
RBC: 4.12 x10E6/uL (ref 3.77–5.28)
RDW: 14.1 % (ref 12.3–15.4)
WBC: 4.3 10*3/uL (ref 3.4–10.8)

## 2017-09-16 LAB — ABO AND RH: Rh Factor: POSITIVE

## 2017-09-16 LAB — URINALYSIS, ROUTINE W REFLEX MICROSCOPIC
Bilirubin, UA: NEGATIVE
GLUCOSE, UA: NEGATIVE
KETONES UA: NEGATIVE
LEUKOCYTES UA: NEGATIVE
Nitrite, UA: NEGATIVE
PROTEIN UA: NEGATIVE
RBC, UA: NEGATIVE
SPEC GRAV UA: 1.007 (ref 1.005–1.030)
Urobilinogen, Ur: 0.2 mg/dL (ref 0.2–1.0)
pH, UA: 7 (ref 5.0–7.5)

## 2017-09-16 LAB — HEPATITIS B SURFACE ANTIGEN: HEP B S AG: NEGATIVE

## 2017-09-16 LAB — RPR: RPR: NONREACTIVE

## 2017-09-16 LAB — ANTIBODY SCREEN: ANTIBODY SCREEN: NEGATIVE

## 2017-09-16 LAB — HIV ANTIBODY (ROUTINE TESTING W REFLEX): HIV Screen 4th Generation wRfx: NONREACTIVE

## 2017-09-16 LAB — RUBELLA SCREEN: RUBELLA: 19.2 {index} (ref >0.99)

## 2017-09-16 LAB — VARICELLA ZOSTER ANTIBODY, IGG: Varicella zoster IgG: 822 index (ref >165)

## 2017-09-17 LAB — GC/CHLAMYDIA PROBE AMP
Chlamydia trachomatis, NAA: NEGATIVE
Neisseria gonorrhoeae by PCR: NEGATIVE

## 2017-09-17 LAB — URINE CULTURE

## 2017-09-18 LAB — MONITOR DRUG PROFILE 14(MW)
Amphetamine Scrn, Ur: NEGATIVE ng/mL
BARBITURATE SCREEN URINE: NEGATIVE ng/mL
BENZODIAZEPINE SCREEN, URINE: NEGATIVE ng/mL
Buprenorphine, Urine: NEGATIVE ng/mL
CANNABINOIDS UR QL SCN: NEGATIVE ng/mL
COCAINE(METAB.)SCREEN, URINE: NEGATIVE ng/mL
Creatinine(Crt), U: 77.1 mg/dL (ref 20.0–300.0)
FENTANYL, URINE: NEGATIVE pg/mL
MEPERIDINE SCREEN, URINE: NEGATIVE ng/mL
Methadone Screen, Urine: NEGATIVE ng/mL
OPIATE SCREEN URINE: NEGATIVE ng/mL
OXYCODONE+OXYMORPHONE UR QL SCN: NEGATIVE ng/mL
Ph of Urine: 6.6 (ref 4.5–8.9)
Phencyclidine Qn, Ur: NEGATIVE ng/mL
Propoxyphene Scrn, Ur: NEGATIVE ng/mL
SPECIFIC GRAVITY: 1.007
TRAMADOL SCREEN, URINE: NEGATIVE ng/mL

## 2017-09-19 ENCOUNTER — Encounter: Payer: Self-pay | Admitting: Obstetrics and Gynecology

## 2017-09-19 ENCOUNTER — Other Ambulatory Visit: Payer: Self-pay | Admitting: *Deleted

## 2017-09-19 ENCOUNTER — Other Ambulatory Visit: Payer: Self-pay | Admitting: Obstetrics and Gynecology

## 2017-09-19 ENCOUNTER — Encounter: Payer: 59 | Admitting: Obstetrics and Gynecology

## 2017-09-19 DIAGNOSIS — R8271 Bacteriuria: Secondary | ICD-10-CM

## 2017-09-19 MED ORDER — NITROFURANTOIN MONOHYD MACRO 100 MG PO CAPS: 100.0000 mg | ORAL_CAPSULE | Freq: Two times a day (BID) | ORAL | 1 refills | Status: DC

## 2017-09-19 MED ORDER — PANTOPRAZOLE SODIUM 40 MG PO TBEC: 40.0000 mg | DELAYED_RELEASE_TABLET | Freq: Every day | ORAL | 2 refills | Status: DC

## 2017-10-12 ENCOUNTER — Ambulatory Visit (INDEPENDENT_AMBULATORY_CARE_PROVIDER_SITE_OTHER): Payer: 59 | Admitting: Certified Nurse Midwife

## 2017-10-12 VITALS — BP 134/73 | HR 95 | Wt 166.2 lb

## 2017-10-12 DIAGNOSIS — R102 Pelvic and perineal pain: Secondary | ICD-10-CM

## 2017-10-12 DIAGNOSIS — O26899 Other specified pregnancy related conditions, unspecified trimester: Secondary | ICD-10-CM

## 2017-10-12 DIAGNOSIS — Z3492 Encounter for supervision of normal pregnancy, unspecified, second trimester: Secondary | ICD-10-CM

## 2017-10-12 DIAGNOSIS — O2210 Genital varices in pregnancy, unspecified trimester: Secondary | ICD-10-CM | POA: Insufficient documentation

## 2017-10-12 DIAGNOSIS — Z3689 Encounter for other specified antenatal screening: Secondary | ICD-10-CM

## 2017-10-12 LAB — POCT URINALYSIS DIPSTICK
BILIRUBIN UA: NEGATIVE
Blood, UA: NEGATIVE
Glucose, UA: NEGATIVE
KETONES UA: NEGATIVE
Leukocytes, UA: NEGATIVE
Nitrite, UA: NEGATIVE
PH UA: 6.5 (ref 5.0–8.0)
Protein, UA: NEGATIVE
Spec Grav, UA: 1.015 (ref 1.010–1.025)
UROBILINOGEN UA: 0.2 U/dL

## 2017-10-12 NOTE — Progress Notes (Signed)
Pt is here for an ROB visit. 

## 2017-10-12 NOTE — Progress Notes (Signed)
ROB-Wearing V2 support for pelvic pressure and varicose veins. Discussed home treatment measures including pelvic floor therapy. Anticipatory guidance regarding course of prenatal care. Reviewed red flag symptoms and when to call. RTC x 4 weeks for anatomy scan and ROB or sooner if needed.

## 2017-10-12 NOTE — Patient Instructions (Signed)
Common Medications Safe in Pregnancy  Acne:      Constipation:  Benzoyl Peroxide     Colace  Clindamycin      Dulcolax Suppository  Topica Erythromycin     Fibercon  Salicylic Acid      Metamucil         Miralax AVOID:        Senakot   Accutane    Cough:  Retin-A       Cough Drops  Tetracycline      Phenergan w/ Codeine if Rx  Minocycline      Robitussin (Plain & DM)  Antibiotics:     Crabs/Lice:  Ceclor       RID  Cephalosporins    AVOID:  E-Mycins      Kwell  Keflex  Macrobid/Macrodantin   Diarrhea:  Penicillin      Kao-Pectate  Zithromax      Imodium AD         PUSH FLUIDS AVOID:       Cipro     Fever:  Tetracycline      Tylenol (Regular or Extra  Minocycline       Strength)  Levaquin      Extra Strength-Do not          Exceed 8 tabs/24 hrs Caffeine:        <200mg/day (equiv. To 1 cup of coffee or  approx. 3 12 oz sodas)         Gas: Cold/Hayfever:       Gas-X  Benadryl      Mylicon  Claritin       Phazyme  **Claritin-D        Chlor-Trimeton    Headaches:  Dimetapp      ASA-Free Excedrin  Drixoral-Non-Drowsy     Cold Compress  Mucinex (Guaifenasin)     Tylenol (Regular or Extra  Sudafed/Sudafed-12 Hour     Strength)  **Sudafed PE Pseudoephedrine   Tylenol Cold & Sinus     Vicks Vapor Rub  Zyrtec  **AVOID if Problems With Blood Pressure         Heartburn: Avoid lying down for at least 1 hour after meals  Aciphex      Maalox     Rash:  Milk of Magnesia     Benadryl    Mylanta       1% Hydrocortisone Cream  Pepcid  Pepcid Complete   Sleep Aids:  Prevacid      Ambien   Prilosec       Benadryl  Rolaids       Chamomile Tea  Tums (Limit 4/day)     Unisom  Zantac       Tylenol PM         Warm milk-add vanilla or  Hemorrhoids:       Sugar for taste  Anusol/Anusol H.C.  (RX: Analapram 2.5%)  Sugar Substitutes:  Hydrocortisone OTC     Ok in moderation  Preparation H      Tucks        Vaseline lotion applied to tissue with  wiping    Herpes:     Throat:  Acyclovir      Oragel  Famvir  Valtrex     Vaccines:         Flu Shot Leg Cramps:       *Gardasil  Benadryl      Hepatitis A         Hepatitis B Nasal Spray:         Pneumovax  Saline Nasal Spray     Polio Booster         Tetanus Nausea:       Tuberculosis test or PPD  Vitamin B6 25 mg TID   AVOID:    Dramamine      *Gardasil  Emetrol       Live Poliovirus  Ginger Root 250 mg QID    MMR (measles, mumps &  High Complex Carbs @ Bedtime    rebella)  Sea Bands-Accupressure    Varicella (Chickenpox)  Unisom 1/2 tab TID     *No known complications           If received before Pain:         Known pregnancy;   Darvocet       Resume series after  Lortab        Delivery  Percocet    Yeast:   Tramadol      Femstat  Tylenol 3      Gyne-lotrimin  Ultram       Monistat  Vicodin           MISC:         All Sunscreens           Hair Coloring/highlights          Insect Repellant's          (Including DEET)         Mystic Tans Back Pain in Pregnancy Back pain during pregnancy is common. Back pain may be caused by several factors that are related to changes during your pregnancy. Follow these instructions at home: Managing pain, stiffness, and swelling  If directed, apply ice for sudden (acute) back pain. ? Put ice in a plastic bag. ? Place a towel between your skin and the bag. ? Leave the ice on for 20 minutes, 2-3 times per day.  If directed, apply heat to the affected area before you exercise: ? Place a towel between your skin and the heat pack or heating pad. ? Leave the heat on for 20-30 minutes. ? Remove the heat if your skin turns bright red. This is especially important if you are unable to feel pain, heat, or cold. You may have a greater risk of getting burned. Activity  Exercise as told by your health care provider. Exercising is the best way to prevent or manage back pain.  Listen to your body when lifting. If lifting hurts, ask for help or  bend your knees. This uses your leg muscles instead of your back muscles.  Squat down when picking up something from the floor. Do not bend over.  Only use bed rest as told by your health care provider. Bed rest should only be used for the most severe episodes of back pain. Standing, Sitting, and Lying Down  Do not stand in one place for long periods of time.  Use good posture when sitting. Make sure your head rests over your shoulders and is not hanging forward. Use a pillow on your lower back if necessary.  Try sleeping on your side, preferably the left side, with a pillow or two between your legs. If you are sore after a night's rest, your bed may be too soft. A firm mattress may provide more support for your back during pregnancy. General instructions  Do not wear high heels.  Eat a healthy diet. Try to gain weight within your health care provider's recommendations.  Use a maternity girdle, elastic sling, or   back brace as told by your health care provider.  Take over-the-counter and prescription medicines only as told by your health care provider.  Keep all follow-up visits as told by your health care provider. This is important. This includes any visits with any specialists, such as a physical therapist. Contact a health care provider if:  Your back pain interferes with your daily activities.  You have increasing pain in other parts of your body. Get help right away if:  You develop numbness, tingling, weakness, or problems with the use of your arms or legs.  You develop severe back pain that is not controlled with medicine.  You have a sudden change in bowel or bladder control.  You develop shortness of breath, dizziness, or you faint.  You develop nausea, vomiting, or sweating.  You have back pain that is a rhythmic, cramping pain similar to labor pains. Labor pain is usually 1-2 minutes apart, lasts for about 1 minute, and involves a bearing down feeling or pressure in  your pelvis.  You have back pain and your water breaks or you have vaginal bleeding.  You have back pain or numbness that travels down your leg.  Your back pain developed after you fell.  You develop pain on one side of your back.  You see blood in your urine.  You develop skin blisters in the area of your back pain. This information is not intended to replace advice given to you by your health care provider. Make sure you discuss any questions you have with your health care provider. Document Released: 07/27/2005 Document Revised: 09/24/2015 Document Reviewed: 12/31/2014 Elsevier Interactive Patient Education  2018 [employer]. Round Ligament Pain The round ligament is a cord of muscle and tissue that helps to support the uterus. It can become a source of pain during pregnancy if it becomes stretched or twisted as the baby grows. The pain usually begins in the second trimester of pregnancy, and it can come and go until the baby is delivered. It is not a serious problem, and it does not cause harm to the baby. Round ligament pain is usually a short, sharp, and pinching pain, but it can also be a dull, lingering, and aching pain. The pain is felt in the lower side of the abdomen or in the groin. It usually starts deep in the groin and moves up to the outside of the hip area. Pain can occur with:  A sudden change in position.  Rolling over in bed.  Coughing or sneezing.  Physical activity.  Follow these instructions at home: Watch your condition for any changes. Take these steps to help with your pain:  When the pain starts, relax. Then try: ? Sitting down. ? Flexing your knees up to your abdomen. ? Lying on your side with one pillow under your abdomen and another pillow between your legs. ? Sitting in a warm bath for 15-20 minutes or until the pain goes away.  Take over-the-counter and prescription medicines only as told by your health care provider.  Move slowly when you sit  and stand.  Avoid long walks if they cause pain.  Stop or lessen your physical activities if they cause pain.  Contact a health care provider if:  Your pain does not go away with treatment.  You feel pain in your back that you did not have before.  Your medicine is not helping. Get help right away if:  You develop a fever or chills.  You develop uterine contractions.  You develop vaginal bleeding.  You develop nausea or vomiting.  You develop diarrhea.  You have pain when you urinate. This information is not intended to replace advice given to you by your health care provider. Make sure you discuss any questions you have with your health care provider. Document Released: 01/26/2008 Document Revised: 09/24/2015 Document Reviewed: 06/25/2014 Elsevier Interactive Patient Education  [employer].

## 2017-11-09 ENCOUNTER — Ambulatory Visit (INDEPENDENT_AMBULATORY_CARE_PROVIDER_SITE_OTHER): Payer: 59 | Admitting: Certified Nurse Midwife

## 2017-11-09 ENCOUNTER — Ambulatory Visit (INDEPENDENT_AMBULATORY_CARE_PROVIDER_SITE_OTHER): Payer: 59

## 2017-11-09 VITALS — BP 116/78 | HR 88 | Wt 169.5 lb

## 2017-11-09 DIAGNOSIS — Z3492 Encounter for supervision of normal pregnancy, unspecified, second trimester: Secondary | ICD-10-CM

## 2017-11-09 DIAGNOSIS — Z3689 Encounter for other specified antenatal screening: Secondary | ICD-10-CM

## 2017-11-09 DIAGNOSIS — Z363 Encounter for antenatal screening for malformations: Secondary | ICD-10-CM | POA: Diagnosis not present

## 2017-11-09 LAB — POCT URINALYSIS DIPSTICK
Bilirubin, UA: NEGATIVE
Blood, UA: NEGATIVE
Glucose, UA: NEGATIVE
Ketones, UA: NEGATIVE
Leukocytes, UA: NEGATIVE
Nitrite, UA: NEGATIVE
Protein, UA: NEGATIVE
Spec Grav, UA: 1.025 (ref 1.010–1.025)
Urobilinogen, UA: 0.2 E.U./dL
pH, UA: 5 (ref 5.0–8.0)

## 2017-11-09 NOTE — Progress Notes (Signed)
ROB-Doing well, wearing V2 supporter at work with relief. Anatomy scan today, complete and normal. Anticipatory guidance regarding course of prenatal care. Reviewed red flag symptoms and when to call. RTC x 4 weeks for ROB or sooner if needed.    ULTRASOUND REPORT  Location: [facility] Date of Service:  11/09/2017  Indications: Anatomy Findings:  Singleton intrauterine pregnancy is visualized with FHR at 140 BPM. Biometrics give an (U/S) Gestational age of 31 2/7 weeks and an (U/S) EDD of 03/27/18; this correlates with the clinically established EDD of 03/30/18.  Fetal presentation is vertex.  EFW: 350 grams (0lb 12oz). Placenta: Anterior and grade 1. AFI: WNL subjectively.  Anatomic survey is complete and appears WNL; Gender - Female.   Right Ovary measures 2.8 x 2.3 x 1.9 cm. It is normal in appearance. Left Ovary measures 1.8 x 1.6 x 1.2 cm. It is normal appearance. There is no obvious evidence of a corpus luteal cyst. Survey of the adnexa demonstrates no adnexal masses. There is no free peritoneal fluid in the cul de sac.  Impression: 1. 20 2/7 week Viable Singleton Intrauterine pregnancy by U/S. 2. (U/S) EDD is consistent with Clinically established (LMP) EDD of 03/30/18. 3. Normal Anatomy Scan  Recommendations: 1.Clinical correlation with the patient's History and Physical Exam.

## 2017-11-09 NOTE — Patient Instructions (Signed)
Common Medications Safe in Pregnancy  Acne:      Constipation:  Benzoyl Peroxide     Colace  Clindamycin      Dulcolax Suppository  Topica Erythromycin     Fibercon  Salicylic Acid      Metamucil         Miralax AVOID:        Senakot   Accutane    Cough:  Retin-A       Cough Drops  Tetracycline      Phenergan w/ Codeine if Rx  Minocycline      Robitussin (Plain & DM)  Antibiotics:     Crabs/Lice:  Ceclor       RID  Cephalosporins    AVOID:  E-Mycins      Kwell  Keflex  Macrobid/Macrodantin   Diarrhea:  Penicillin      Kao-Pectate  Zithromax      Imodium AD         PUSH FLUIDS AVOID:       Cipro     Fever:  Tetracycline      Tylenol (Regular or Extra  Minocycline       Strength)  Levaquin      Extra Strength-Do not          Exceed 8 tabs/24 hrs Caffeine:        <200mg/day (equiv. To 1 cup of coffee or  approx. 3 12 oz sodas)         Gas: Cold/Hayfever:       Gas-X  Benadryl      Mylicon  Claritin       Phazyme  **Claritin-D        Chlor-Trimeton    Headaches:  Dimetapp      ASA-Free Excedrin  Drixoral-Non-Drowsy     Cold Compress  Mucinex (Guaifenasin)     Tylenol (Regular or Extra  Sudafed/Sudafed-12 Hour     Strength)  **Sudafed PE Pseudoephedrine   Tylenol Cold & Sinus     Vicks Vapor Rub  Zyrtec  **AVOID if Problems With Blood Pressure         Heartburn: Avoid lying down for at least 1 hour after meals  Aciphex      Maalox     Rash:  Milk of Magnesia     Benadryl    Mylanta       1% Hydrocortisone Cream  Pepcid  Pepcid Complete   Sleep Aids:  Prevacid      Ambien   Prilosec       Benadryl  Rolaids       Chamomile Tea  Tums (Limit 4/day)     Unisom  Zantac       Tylenol PM         Warm milk-add vanilla or  Hemorrhoids:       Sugar for taste  Anusol/Anusol H.C.  (RX: Analapram 2.5%)  Sugar Substitutes:  Hydrocortisone OTC     Ok in moderation  Preparation H      Tucks        Vaseline lotion applied to tissue with  wiping    Herpes:     Throat:  Acyclovir      Oragel  Famvir  Valtrex     Vaccines:         Flu Shot Leg Cramps:       *Gardasil  Benadryl      Hepatitis A         Hepatitis B Nasal Spray:         Pneumovax  Saline Nasal Spray     Polio Booster         Tetanus Nausea:       Tuberculosis test or PPD  Vitamin B6 25 mg TID   AVOID:    Dramamine      *Gardasil  Emetrol       Live Poliovirus  Ginger Root 250 mg QID    MMR (measles, mumps &  High Complex Carbs @ Bedtime    rebella)  Sea Bands-Accupressure    Varicella (Chickenpox)  Unisom 1/2 tab TID     *No known complications           If received before Pain:         Known pregnancy;   Darvocet       Resume series after  Lortab        Delivery  Percocet    Yeast:   Tramadol      Femstat  Tylenol 3      Gyne-lotrimin  Ultram       Monistat  Vicodin           MISC:         All Sunscreens           Hair Coloring/highlights          Insect Repellant's          (Including DEET)         Mystic Tans WHAT OB PATIENTS CAN EXPECT   Confirmation of pregnancy and ultrasound ordered if medically indicated-[redacted] weeks gestation  New OB (NOB) intake with nurse and New OB (NOB) labs- [redacted] weeks gestation  New OB (NOB) physical examination with provider- 11/[redacted] weeks gestation  Flu vaccine-[redacted] weeks gestation  Anatomy scan-[redacted] weeks gestation  Glucose tolerance test, blood work to test for anemia, T-dap vaccine-[redacted] weeks gestation  Vaginal swabs/cultures-STD/Group B strep-[redacted] weeks gestation  Appointments every 4 weeks until 28 weeks  Every 2 weeks from 28 weeks until 36 weeks  Weekly visits from 36 weeks until delivery  Back Pain in Pregnancy Back pain during pregnancy is common. Back pain may be caused by several factors that are related to changes during your pregnancy. Follow these instructions at home: Managing pain, stiffness, and swelling  If directed, apply ice for sudden (acute) back pain. ? Put ice in a plastic bag. ? Place  a towel between your skin and the bag. ? Leave the ice on for 20 minutes, 2-3 times per day.  If directed, apply heat to the affected area before you exercise: ? Place a towel between your skin and the heat pack or heating pad. ? Leave the heat on for 20-30 minutes. ? Remove the heat if your skin turns bright red. This is especially important if you are unable to feel pain, heat, or cold. You may have a greater risk of getting burned. Activity  Exercise as told by your health care provider. Exercising is the best way to prevent or manage back pain.  Listen to your body when lifting. If lifting hurts, ask for help or bend your knees. This uses your leg muscles instead of your back muscles.  Squat down when picking up something from the floor. Do not bend over.  Only use bed rest as told by your health care provider. Bed rest should only be used for the most severe episodes of back pain. Standing, Sitting, and Lying Down  Do not stand in one place for long periods of time.  Use good posture when sitting. Make sure your head rests over your shoulders and is not hanging forward. Use a pillow on your lower back if necessary.  Try sleeping on your side, preferably the left side, with a pillow or two between your legs. If you are sore after a night's rest, your bed may be too soft. A firm mattress may provide more support for your back during pregnancy. General instructions  Do not wear high heels.  Eat a healthy diet. Try to gain weight within your health care provider's recommendations.  Use a maternity girdle, elastic sling, or back brace as told by your health care provider.  Take over-the-counter and prescription medicines only as told by your health care provider.  Keep all follow-up visits as told by your health care provider. This is important. This includes any visits with any specialists, such as a physical therapist. Contact a health care provider if:  Your back pain  interferes with your daily activities.  You have increasing pain in other parts of your body. Get help right away if:  You develop numbness, tingling, weakness, or problems with the use of your arms or legs.  You develop severe back pain that is not controlled with medicine.  You have a sudden change in bowel or bladder control.  You develop shortness of breath, dizziness, or you faint.  You develop nausea, vomiting, or sweating.  You have back pain that is a rhythmic, cramping pain similar to labor pains. Labor pain is usually 1-2 minutes apart, lasts for about 1 minute, and involves a bearing down feeling or pressure in your pelvis.  You have back pain and your water breaks or you have vaginal bleeding.  You have back pain or numbness that travels down your leg.  Your back pain developed after you fell.  You develop pain on one side of your back.  You see blood in your urine.  You develop skin blisters in the area of your back pain. This information is not intended to replace advice given to you by your health care provider. Make sure you discuss any questions you have with your health care provider. Document Released: 07/27/2005 Document Revised: 09/24/2015 Document Reviewed: 12/31/2014 Elsevier Interactive Patient Education  2018 [employer]. Round Ligament Pain The round ligament is a cord of muscle and tissue that helps to support the uterus. It can become a source of pain during pregnancy if it becomes stretched or twisted as the baby grows. The pain usually begins in the second trimester of pregnancy, and it can come and go until the baby is delivered. It is not a serious problem, and it does not cause harm to the baby. Round ligament pain is usually a short, sharp, and pinching pain, but it can also be a dull, lingering, and aching pain. The pain is felt in the lower side of the abdomen or in the groin. It usually starts deep in the groin and moves up to the outside of the  hip area. Pain can occur with:  A sudden change in position.  Rolling over in bed.  Coughing or sneezing.  Physical activity.  Follow these instructions at home: Watch your condition for any changes. Take these steps to help with your pain:  When the pain starts, relax. Then try: ? Sitting down. ? Flexing your knees up to your abdomen. ? Lying on your side with one pillow under your abdomen and another pillow between your legs. ? Sitting in a warm bath  for 15-20 minutes or until the pain goes away.  Take over-the-counter and prescription medicines only as told by your health care provider.  Move slowly when you sit and stand.  Avoid long walks if they cause pain.  Stop or lessen your physical activities if they cause pain.  Contact a health care provider if:  Your pain does not go away with treatment.  You feel pain in your back that you did not have before.  Your medicine is not helping. Get help right away if:  You develop a fever or chills.  You develop uterine contractions.  You develop vaginal bleeding.  You develop nausea or vomiting.  You develop diarrhea.  You have pain when you urinate. This information is not intended to replace advice given to you by your health care provider. Make sure you discuss any questions you have with your health care provider. Document Released: 01/26/2008 Document Revised: 09/24/2015 Document Reviewed: 06/25/2014 Elsevier Interactive Patient Education  [employer].

## 2017-11-09 NOTE — Progress Notes (Signed)
Pt is here for an ROB visit. 

## 2017-11-15 ENCOUNTER — Telehealth: Payer: Self-pay | Admitting: Obstetrics and Gynecology

## 2017-11-15 ENCOUNTER — Other Ambulatory Visit: Payer: Self-pay | Admitting: *Deleted

## 2017-11-15 MED ORDER — PROMETHAZINE HCL 25 MG PO TABS: 25.0000 mg | ORAL_TABLET | Freq: Four times a day (QID) | ORAL | 1 refills | Status: DC | PRN

## 2017-11-15 NOTE — Telephone Encounter (Signed)
The patient needs a call back; she has had a GI bug for a week.  Her call back number is [phone], please advise, thanks.

## 2017-11-15 NOTE — Telephone Encounter (Signed)
Sent pt in phenegran

## 2017-11-21 ENCOUNTER — Encounter: Payer: Self-pay | Admitting: Obstetrics and Gynecology

## 2017-11-21 ENCOUNTER — Other Ambulatory Visit: Payer: Self-pay

## 2017-11-21 ENCOUNTER — Emergency Department
Admission: EM | Admit: 2017-11-21 | Discharge: 2017-11-21 | Disposition: A | Discharge status: Home / Self Care | Payer: 59 | Attending: Student in an Organized Health Care Education/Training Program | Admitting: Student in an Organized Health Care Education/Training Program

## 2017-11-21 DIAGNOSIS — R350 Frequency of micturition: Secondary | ICD-10-CM | POA: Insufficient documentation

## 2017-11-21 DIAGNOSIS — Z79899 Other long term (current) drug therapy: Secondary | ICD-10-CM | POA: Insufficient documentation

## 2017-11-21 DIAGNOSIS — O2312 Infections of bladder in pregnancy, second trimester: Secondary | ICD-10-CM | POA: Diagnosis not present

## 2017-11-21 DIAGNOSIS — Z3A22 22 weeks gestation of pregnancy: Secondary | ICD-10-CM | POA: Diagnosis not present

## 2017-11-21 DIAGNOSIS — R300 Dysuria: Secondary | ICD-10-CM | POA: Diagnosis not present

## 2017-11-21 DIAGNOSIS — O26892 Other specified pregnancy related conditions, second trimester: Secondary | ICD-10-CM | POA: Diagnosis present

## 2017-11-21 LAB — URINALYSIS, COMPLETE (UACMP) WITH MICROSCOPIC
Bacteria, UA: NONE SEEN
Bilirubin Urine: NEGATIVE
GLUCOSE, UA: NEGATIVE mg/dL
KETONES UR: NEGATIVE mg/dL
NITRITE: NEGATIVE
PH: 6 (ref 5.0–8.0)
PROTEIN: 30 mg/dL — AB
Specific Gravity, Urine: 1.023 (ref 1.005–1.030)

## 2017-11-21 MED ORDER — NITROFURANTOIN MONOHYD MACRO 100 MG PO CAPS
100.0000 mg | ORAL_CAPSULE | Freq: Once | ORAL | Status: AC
  Administered 2017-11-21: 100 mg via ORAL
  Filled 2017-11-21: qty 1

## 2017-11-21 MED ORDER — NITROFURANTOIN MONOHYD MACRO 100 MG PO CAPS: 100.0000 mg | ORAL_CAPSULE | Freq: Two times a day (BID) | ORAL | 0 refills | Status: AC

## 2017-11-21 NOTE — ED Notes (Signed)
Pharmacy called for medication.

## 2017-11-21 NOTE — ED Triage Notes (Signed)
Pelvic pressure, pain with urination, burning, increased frequency, bladder fullness.

## 2017-11-21 NOTE — ED Notes (Signed)
Pt to the er for urinary symptoms with increasing pain. Pt is pregnant. Pt in obvious pain.

## 2017-11-21 NOTE — ED Provider Notes (Signed)
[facility] Emergency Department Provider Note  ____________________________________________  Time seen: Approximately 11:05 PM  I have reviewed the triage vital signs and the nursing notes.   HISTORY  Chief Complaint Recurrent UTI    HPI Paige Brown is a 32 y.o. female who presents to the emergency department for evaluation and treatment of acute onset dysuria, increased frequency, and inability to fully empty her bladder.  She has had the symptoms in the past and was diagnosed with urinary tract infection.  She is [redacted] weeks pregnant.  She denies any vaginal discharge or bleeding.  She states that she had intercourse a few days ago and was unable to urinate afterward which is typically the trigger for her urinary tract infections.  She has taken Tylenol without any relief.  Past Medical History:  Diagnosis Date  . Anemia    with pregnancies  . GERD (gastroesophageal reflux disease)    during pregnancy  . UTI (lower urinary tract infection) 11/09/2015   2 more days to complete ATB    Patient Active Problem List   Diagnosis Date Noted  . Pelvic pain affecting pregnancy, antepartum 10/12/2017  . 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 GBS bacteriuria 09/19/2017  . Vaginal birth after cesarean (VBAC) 06/11/2016  . Vitamin D deficiency 04/12/2016  . Temporary low platelet count ([place]) 04/12/2016  . History of vaginal delivery following previous cesarean delivery 01/05/2016    Past Surgical History:  Procedure Laterality Date  . CESAREAN SECTION    . TONSILLECTOMY  1994   and adnoids  . [place]    Prior to Admission medications   Medication Sig Start Date End Date Taking? Authorizing Provider  Iron-FA-B Cmp-C-Biot-Probiotic (FUSION PLUS) CAPS Take 1 capsule by mouth daily. 06/13/16   Shambley, Melody N, CNM  nitrofurantoin, macrocrystal-monohydrate, (MACROBID) 100 MG capsule Take 1 capsule (100 mg total) by  mouth 2 (two) times daily for 7 days. 11/21/17 11/28/17  Triplett, Johnette Abraham B, FNP  ondansetron (ZOFRAN ODT) 4 MG disintegrating tablet Take 1 tablet (4 mg total) by mouth every 6 (six) hours as needed for nausea. 08/01/17   Shambley, Melody N, CNM  pantoprazole (PROTONIX) 40 MG tablet Take 1 tablet (40 mg total) by mouth daily. 09/19/17   Shambley, Melody N, CNM  Prenatal Vit-Fe Fumarate-FA (PRENATAL VITAMINS) 28-0.8 MG TABS Take by mouth.    [provider]  promethazine (PHENERGAN) 25 MG tablet Take 1 tablet (25 mg total) by mouth every 6 (six) hours as needed for nausea or vomiting. 11/15/17   Shambley, Melody N, CNM  Vitamin D, Ergocalciferol, (DRISDOL) 50000 units CAPS capsule Take 1 capsule (50,000 Units total) by mouth 2 (two) times a week. Patient not taking: Reported on 11/09/2017 07/21/16   Joylene Igo, CNM    Allergies Ciprofloxacin; Penicillins; Shellfish allergy; and Cefaclor  Family History  Problem Relation Age of Onset  . Cancer Father        multiple Myeloma  . Rheum arthritis Maternal Grandmother   . Osteoarthritis Maternal Grandmother   . Breast cancer Maternal Grandmother 60  . Thyroid disease Maternal Grandmother   . Heart failure Maternal Grandfather   . Hyperlipidemia Maternal Grandfather   . Hypertension Maternal Grandfather   . Asthma Paternal Grandmother   . Stroke Paternal Grandfather   . Rheum arthritis Maternal Aunt   . Rheum arthritis Maternal Uncle     Social History Social History   Tobacco Use  . Smoking  status: Never Smoker  . Smokeless tobacco: Never Used  Substance Use Topics  . Alcohol use: Not Currently    Alcohol/week: 0.0 oz    Comment: on occasionally when not breast feeding or pregnancy  . Drug use: No    Review of Systems Constitutional: Negative for fever. Respiratory: Negative for shortness of breath or cough. Gastrointestinal: Negative for abdominal pain; negative for nausea , negative for vomiting. Genitourinary:  Positive for dysuria , negative for vaginal discharge. Musculoskeletal: Negative for back pain. Skin: Negative for acute skin changes/rash/lesion. ____________________________________________   PHYSICAL EXAM:  VITAL SIGNS: ED Triage Vitals  Enc Vitals Group     BP 11/21/17 2121 119/75     Pulse Rate 11/21/17 2121 (!) 103     Resp 11/21/17 2121 18     Temp 11/21/17 2121 98.2 F (36.8 C)     Temp Source 11/21/17 2121 Oral     SpO2 11/21/17 2121 100 %     Weight 11/21/17 2122 163 lb (73.9 kg)     Height 11/21/17 2122 5' 6" (1.676 m)     Head Circumference --      Peak Flow --      Pain Score 11/21/17 2122 8     Pain Loc --      Pain Edu? --      Excl. in [place]? --     Constitutional: Alert and oriented. Well appearing and in no acute distress. Eyes: Conjunctivae are normal. Head: Atraumatic. Nose: No congestion/rhinnorhea. Mouth/Throat: Mucous membranes are moist. Respiratory: Normal respiratory effort.  No retractions. Gastrointestinal: Bowel sounds active x 4; Abdomen is soft without rebound or guarding. Genitourinary: Pelvic exam: Not indicated Musculoskeletal: No extremity tenderness nor edema.  Neurologic:  Normal speech and language. No gross focal neurologic deficits are appreciated. Speech is normal. No gait instability. Skin:  Skin is warm, dry and intact. No rash noted on exposed skin. Psychiatric: Mood and affect are normal. Speech and behavior are normal.  ____________________________________________   LABS (all labs ordered are listed, but only abnormal results are displayed)  Labs Reviewed  URINALYSIS, COMPLETE (UACMP) WITH MICROSCOPIC - Abnormal; Notable for the following components:      Result Value   Color, Urine YELLOW (*)    APPearance CLOUDY (*)    Hgb urine dipstick MODERATE (*)    Protein, ur 30 (*)    Leukocytes, UA MODERATE (*)    RBC / HPF >50 (*)    WBC, UA >50 (*)    Non Squamous Epithelial 6-10 (*)    All other components within normal  limits   ____________________________________________  RADIOLOGY  Not indicated 32 year old female presenting to the emergency department for ____________________________________________  Procedures  ____________________________________________  Acute onset of dysuria.  Urinalysis does indicate acute cystitis.  She will be treated with Macrobid.  She is to call and schedule follow-up with her CNM.  She is to return to the emergency department immediately for any symptoms that change or for new concerns.  INITIAL IMPRESSION / ASSESSMENT AND PLAN / ED COURSE  Pertinent labs & imaging results that were available during my care of the patient were reviewed by me and considered in my medical decision making (see chart for details).  ____________________________________________   FINAL CLINICAL IMPRESSION(S) / ED DIAGNOSES  Final diagnoses:  Acute cystitis during pregnancy in second trimester    Note:  This document was prepared using Dragon voice recognition software and may include unintentional dictation errors.    Isael Stille  B, FNP 11/21/17 2310    Merlyn Lot, MD 11/21/17 2322

## 2017-12-14 ENCOUNTER — Ambulatory Visit (INDEPENDENT_AMBULATORY_CARE_PROVIDER_SITE_OTHER): Payer: 59 | Admitting: Obstetrics and Gynecology

## 2017-12-14 VITALS — BP 118/78 | HR 98 | Wt 172.6 lb

## 2017-12-14 DIAGNOSIS — Z3492 Encounter for supervision of normal pregnancy, unspecified, second trimester: Secondary | ICD-10-CM | POA: Diagnosis not present

## 2017-12-14 LAB — POCT URINALYSIS DIPSTICK
BILIRUBIN UA: NEGATIVE
GLUCOSE UA: NEGATIVE
Ketones, UA: NEGATIVE
Leukocytes, UA: NEGATIVE
Nitrite, UA: NEGATIVE
PH UA: 6.5 (ref 5.0–8.0)
Protein, UA: NEGATIVE
RBC UA: NEGATIVE
SPEC GRAV UA: 1.01 (ref 1.010–1.025)
UROBILINOGEN UA: 0.2 U/dL

## 2017-12-14 NOTE — Progress Notes (Signed)
ROB- pt is doing well 

## 2017-12-14 NOTE — Progress Notes (Signed)
ROB-doing well, glucola next visit. 

## 2017-12-31 IMAGING — DX DG FOREARM 2V*R*
2 series · 2 of 2 positions shown · non-contrast
Comparison: None.

CLINICAL DATA: Status post fall, with right forearm pain. Initial
encounter.

EXAM:
RIGHT FOREARM - 2 VIEW

[forearm ap]
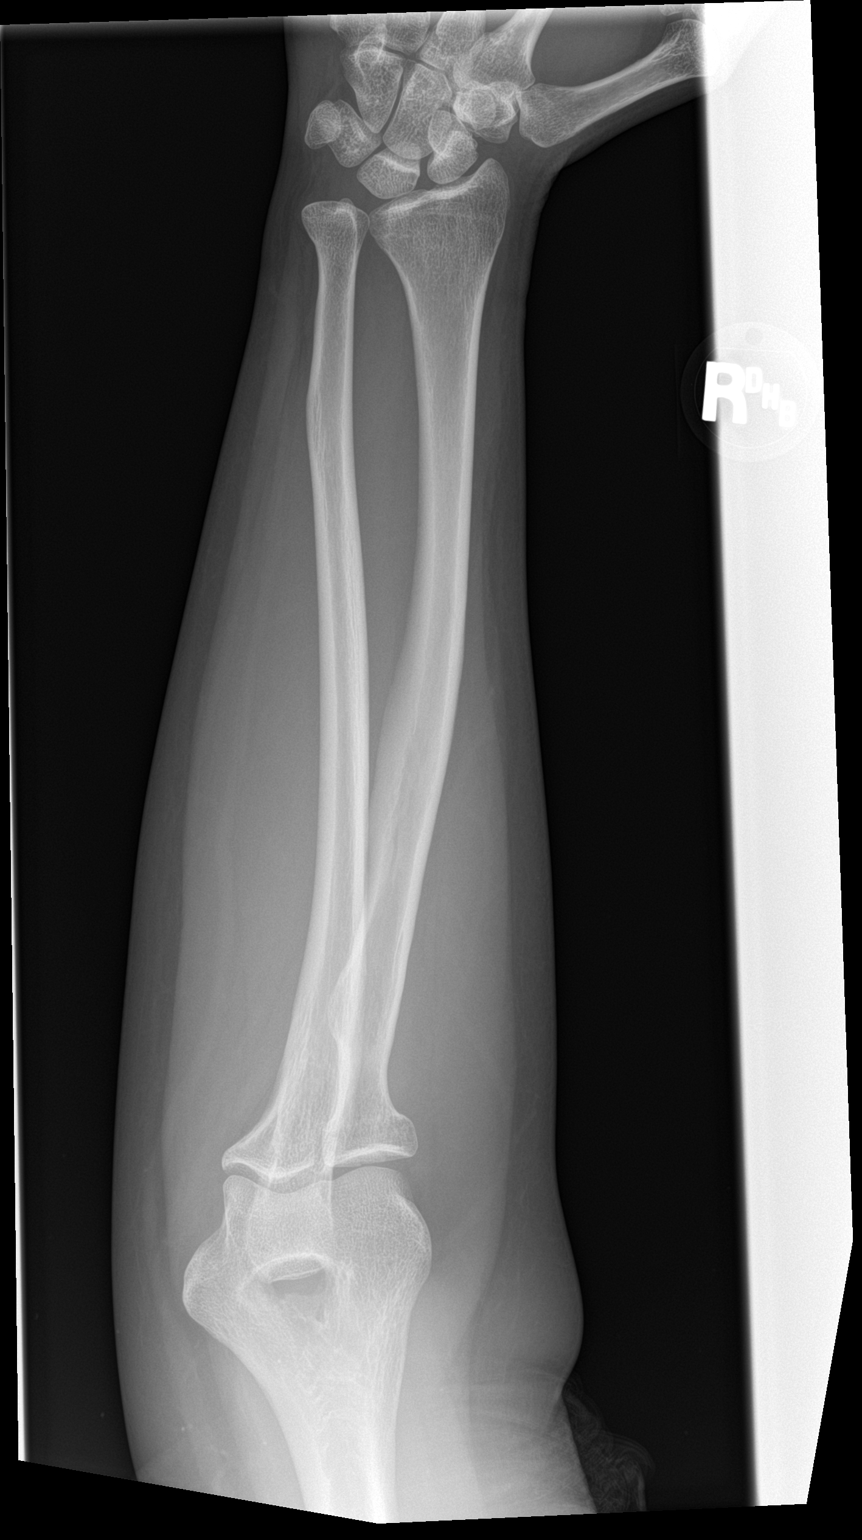

[forearm lat]
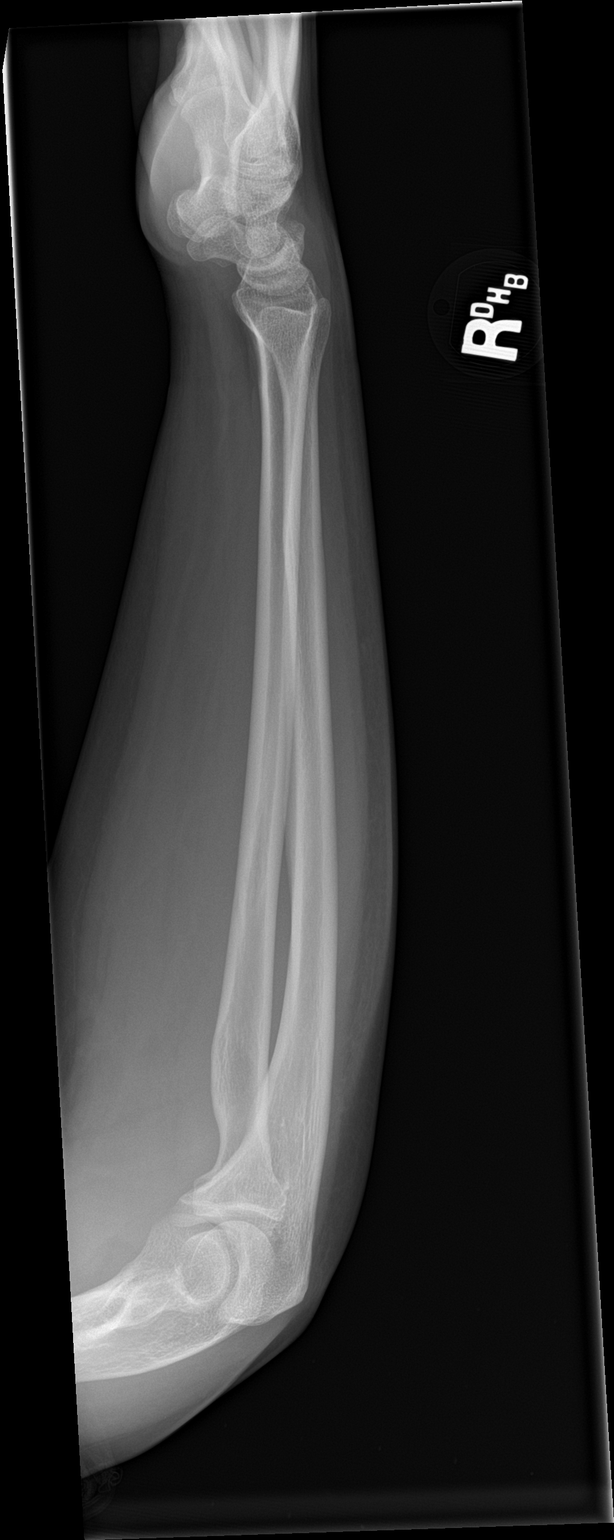

[2 of 2 positions shown; findings below may reference images not displayed]

FINDINGS: There is no evidence of fracture or dislocation. The radius and ulna
appear grossly intact. No elbow joint effusion is identified. The
carpal rows appear grossly intact, and demonstrate normal alignment.
No definite soft tissue abnormalities are characterized on
radiograph.
IMPRESSION: No evidence of fracture or dislocation.

## 2017-12-31 IMAGING — CR DG HUMERUS 2V *R*
2 series · 2 of 2 positions shown · non-contrast
Comparison: None.

CLINICAL DATA: Status post fall, with right upper arm pain. Initial
encounter.

EXAM:
RIGHT HUMERUS - 2+ VIEW

[humerus ap]
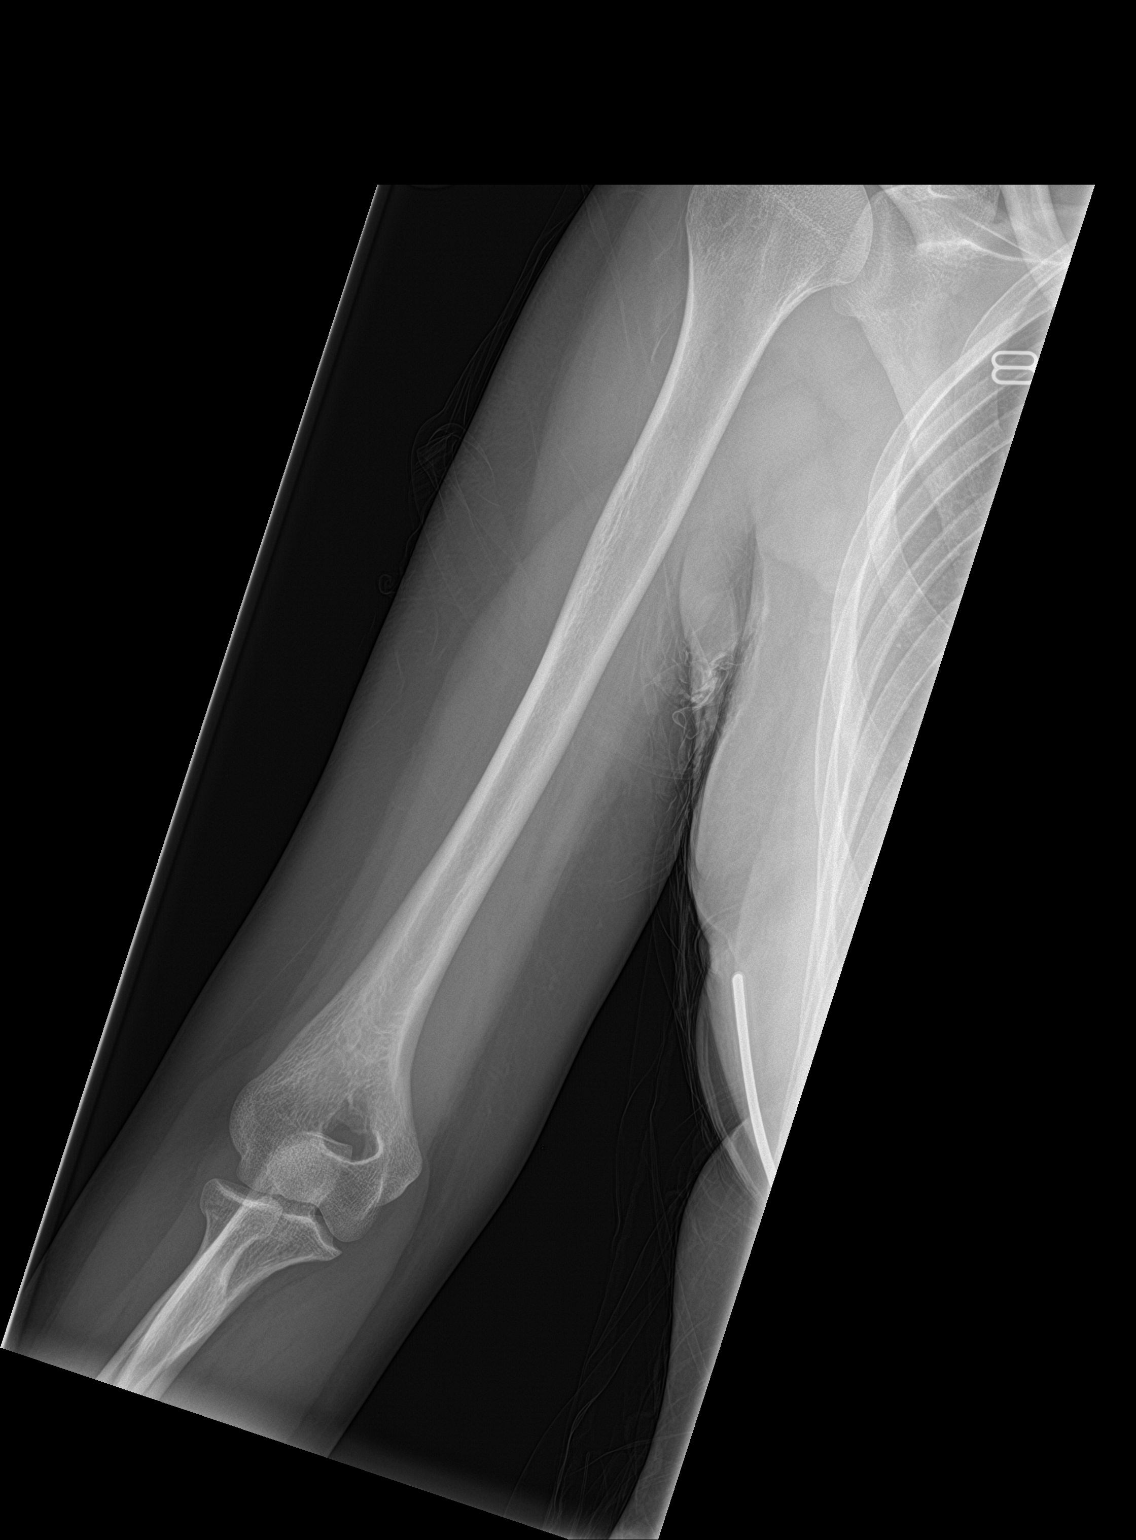

[humerus lat]
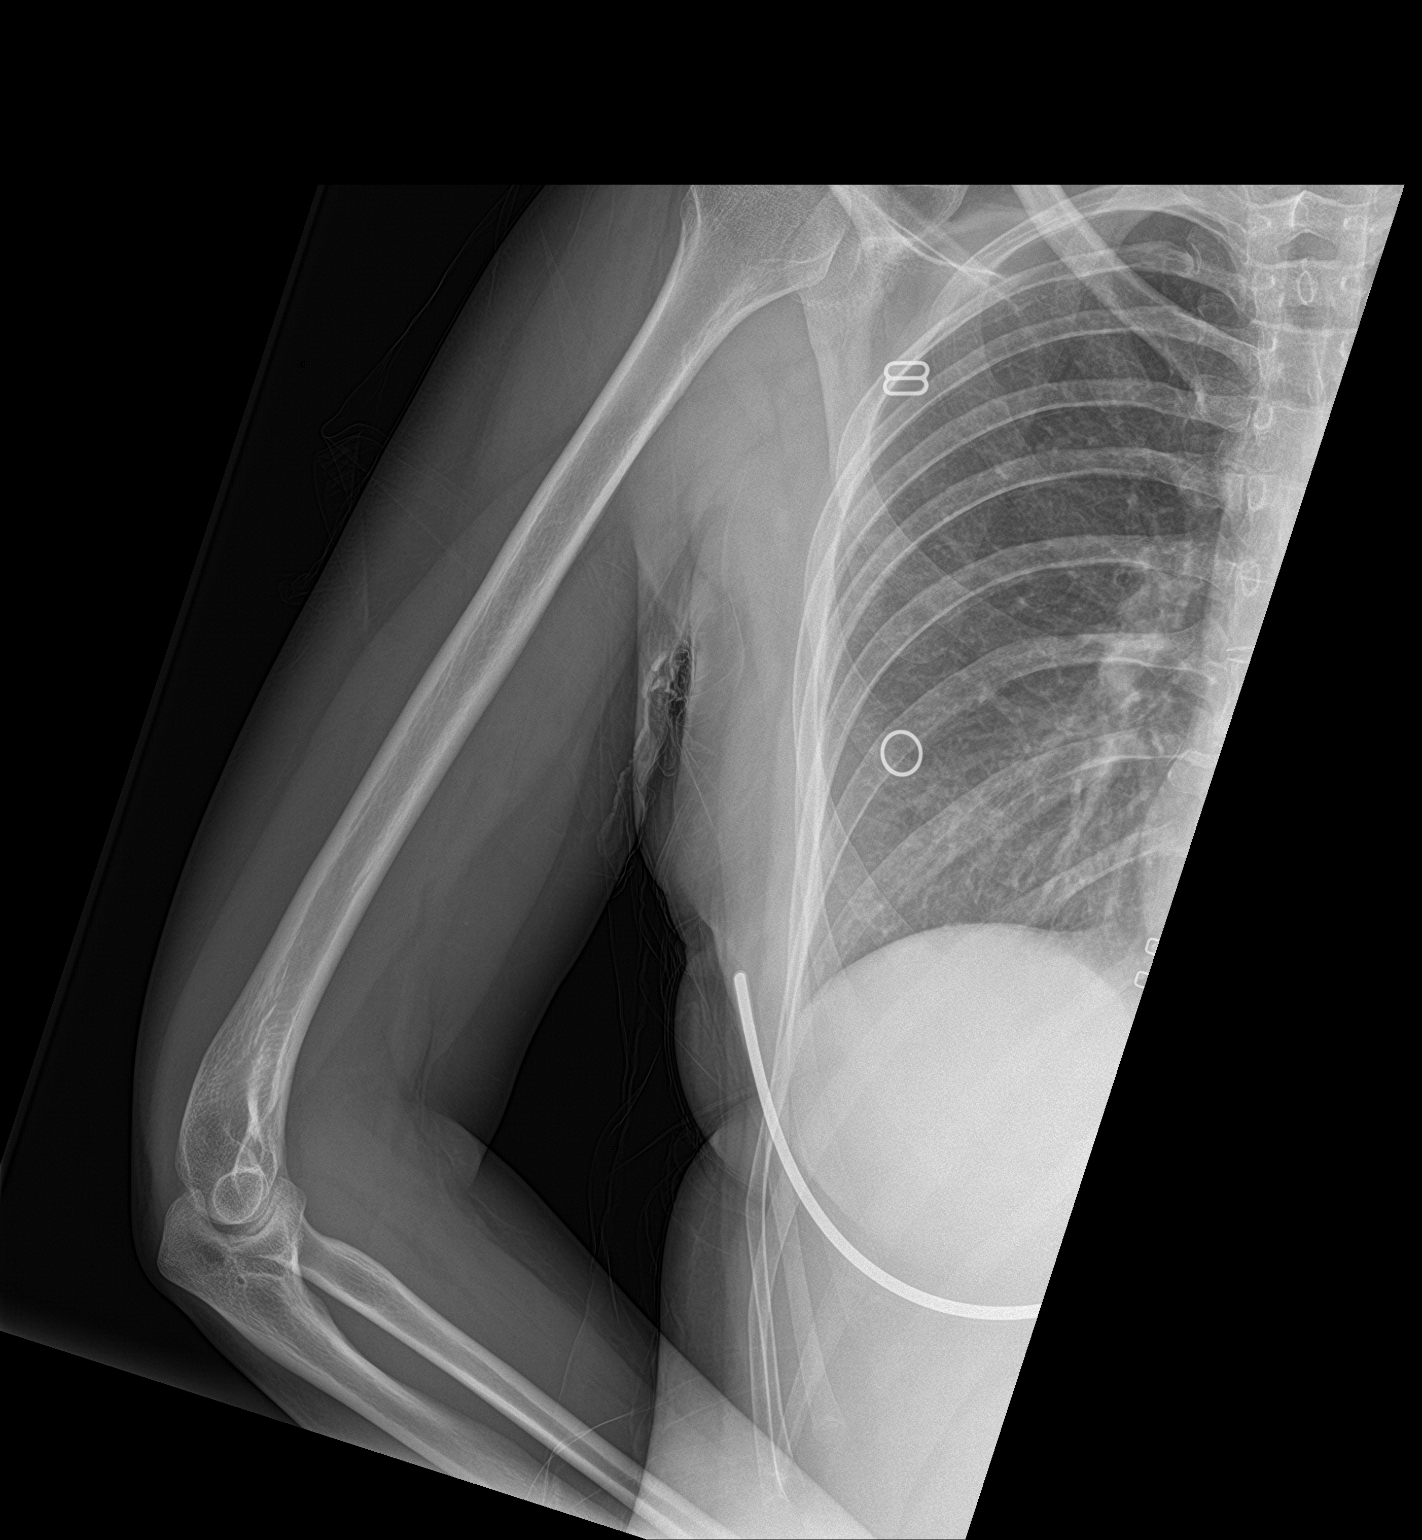

[2 of 2 positions shown; findings below may reference images not displayed]

FINDINGS: There is question of a small fracture at the radial head. Would
correlate for any associated symptoms. No elbow joint effusion is
seen.

The right humerus appears intact. The right humeral head remains
seated at the glenoid fossa. The right acromioclavicular joint is
unremarkable. The right lung appears clear. No definite soft tissue
abnormalities are characterized on radiograph.
IMPRESSION: Question of small fracture at the radial head. Would correlate for
any associated symptoms. No elbow joint effusion seen, suggesting
that this is artifactual in nature.

## 2018-01-11 ENCOUNTER — Other Ambulatory Visit: Payer: 59

## 2018-01-11 ENCOUNTER — Encounter: Payer: 59 | Admitting: Certified Nurse Midwife

## 2018-01-11 ENCOUNTER — Ambulatory Visit (INDEPENDENT_AMBULATORY_CARE_PROVIDER_SITE_OTHER): Payer: 59 | Admitting: Certified Nurse Midwife

## 2018-01-11 VITALS — BP 123/76 | HR 96 | Wt 178.2 lb

## 2018-01-11 DIAGNOSIS — Z3493 Encounter for supervision of normal pregnancy, unspecified, third trimester: Secondary | ICD-10-CM

## 2018-01-11 DIAGNOSIS — Z131 Encounter for screening for diabetes mellitus: Secondary | ICD-10-CM

## 2018-01-11 DIAGNOSIS — Z130 Encounter for screening for diseases of the blood and blood-forming organs and certain disorders involving the immune mechanism: Secondary | ICD-10-CM

## 2018-01-11 DIAGNOSIS — Z3A28 28 weeks gestation of pregnancy: Secondary | ICD-10-CM

## 2018-01-11 LAB — POCT URINALYSIS DIPSTICK OB
BILIRUBIN UA: NEGATIVE
Blood, UA: NEGATIVE
Glucose, UA: NEGATIVE
Leukocytes, UA: NEGATIVE
Nitrite, UA: NEGATIVE
POC,PROTEIN,UA: NEGATIVE
Spec Grav, UA: 1.01 (ref 1.010–1.025)
UROBILINOGEN UA: 0.2 U/dL
pH, UA: 6.5 (ref 5.0–8.0)

## 2018-01-11 MED ORDER — TETANUS-DIPHTH-ACELL PERTUSSIS 5-2.5-18.5 LF-MCG/0.5 IM SUSP
0.5000 mL | Freq: Once | INTRAMUSCULAR | Status: AC
  Administered 2018-01-11: 0.5 mL via INTRAMUSCULAR

## 2018-01-11 NOTE — Patient Instructions (Addendum)
Pain Relief During Labor and Delivery Many things can cause pain during labor and delivery, including:  Pressure on bones and ligaments due to the baby moving through the pelvis.  Stretching of tissues due to the baby moving through the birth canal.  Muscle tension due to anxiety or nervousness.  The uterus tightening (contracting) and relaxing to help move the baby.  There are many ways to deal with the pain of labor and delivery. They include:  Taking prenatal classes. Taking these classes helps you know what to expect during your baby's birth. What you learn will increase your confidence and decrease your anxiety.  Practicing relaxation techniques or doing relaxing activities, such as: ? Focused breathing. ? Meditation. ? Visualization. ? Aroma therapy. ? Listening to your favorite music. ? Hypnosis.  Taking a warm shower or bath (hydrotherapy). This may: ? Provide comfort and relaxation. ? Lessen your perception of pain. ? Decrease the amount of pain medicine needed. ? Decrease the length of labor.  Getting a massage or counterpressure on your back.  Applying warm packs or ice packs.  Changing positions often, moving around, or using a birthing ball.  Getting: ? Pain medicine through an IV or injection into a muscle. ? Pain medicine inserted into your spinal column. ? Injections of sterile water just under the skin on your lower back (intradermal injections). ? Laughing gas (nitrous oxide).  Discuss your pain control options with your health care provider during your prenatal visits. Explore the options offered by your hospital or birth center. What kinds of medicine are available? There are two kinds of medicines that can be used to relieve pain during labor and delivery:  Analgesics. These medicines decrease pain without causing you to lose feeling or the ability to move your muscles.  Anesthetics. These medicines block feeling in the body and can decrease your  ability to move freely.  Both of these kinds of medicine can cause minor side effects, such as nausea, trouble concentrating, and sleepiness. They can also decrease the baby's heart rate before birth and affect the baby's breathing rate after birth. For this reason, health care providers are careful about when and how much medicine is given. What are specific medicines and procedures that provide pain relief? Local Anesthetics Local anesthetics are used to numb a small area of the body. They may be used along with another kind of anesthetic or used to numb the nerves of the vagina, cervix, and perineum during the second stage of labor. General Anesthetics General anesthetics cause you to lose consciousness so you do not feel pain. They are usually only used for an emergency cesarean delivery. General anesthetics are given through an IV tube and a mask. Pudendal Block A pudendal block is a form of local anesthetic. It may be used to relieve the pain associated with pushing or stretching of the perineum at the time of delivery or to further numb the perineum. A pudendal block is done by injecting numbing medicine through the vaginal wall into a nerve in the pelvis. Epidural Analgesia Epidural analgesia is given through a flexible IV catheter that is inserted into the lower back. Numbing medicine is delivered continuously to the area near your spinal column nerves (epidural space). After having this type of analgesia, you may be able to move your legs but you most likely will not be able to walk. Depending on the amount of medicine given, you may lose all feeling in the lower half of your body, or you may  retain some level of sensation, including the urge to push. Epidural analgesia can be used to provide pain relief for a vaginal birth. Spinal Block A spinal block is similar to epidural analgesia, but the medicine is injected into the spinal fluid instead of the epidural space. A spinal block is only  given once. It starts to relieve pain quickly, but the pain relief lasts only 1-6 hours. Spinal blocks can be used for cesarean deliveries. Combined Spinal-Epidural (CSE) Block A CSE block combines the effects of a spinal block and epidural analgesia. The spinal block works quickly to block all pain. The epidural analgesia provides continuous pain relief, even after the effects of the spinal block have worn off. This information is not intended to replace advice given to you by your health care provider. Make sure you discuss any questions you have with your health care provider. Document Released: 08/04/2008 Document Revised: 09/25/2015 Document Reviewed: 09/09/2015 Elsevier Interactive Patient Education  2018 [employer]. Common Medications Safe in Pregnancy  Acne:      Constipation:  Benzoyl Peroxide     Colace  Clindamycin      Dulcolax Suppository  Topica Erythromycin     Fibercon  Salicylic Acid      Metamucil         Miralax AVOID:        Senakot   Accutane    Cough:  Retin-A       Cough Drops  Tetracycline      Phenergan w/ Codeine if Rx  Minocycline      Robitussin (Plain & DM)  Antibiotics:     Crabs/Lice:  Ceclor       RID  Cephalosporins    AVOID:  E-Mycins      Kwell  Keflex  Macrobid/Macrodantin   Diarrhea:  Penicillin      Kao-Pectate  Zithromax      Imodium AD         PUSH FLUIDS AVOID:       Cipro     Fever:  Tetracycline      Tylenol (Regular or Extra  Minocycline       Strength)  Levaquin      Extra Strength-Do not          Exceed 8 tabs/24 hrs Caffeine:        <279m/day (equiv. To 1 cup of coffee or  approx. 3 12 oz sodas)         Gas: Cold/Hayfever:       Gas-X  Benadryl      Mylicon  Claritin       Phazyme  **Claritin-D        Chlor-Trimeton    Headaches:  Dimetapp      ASA-Free Excedrin  Drixoral-Non-Drowsy     Cold Compress  Mucinex (Guaifenasin)     Tylenol (Regular or Extra  Sudafed/Sudafed-12 Hour     Strength)  **Sudafed PE  Pseudoephedrine   Tylenol Cold & Sinus     Vicks Vapor Rub  Zyrtec  **AVOID if Problems With Blood Pressure         Heartburn: Avoid lying down for at least 1 hour after meals  Aciphex      Maalox     Rash:  Milk of Magnesia     Benadryl    Mylanta       1% Hydrocortisone Cream  Pepcid  Pepcid Complete   Sleep Aids:  Prevacid      Ambien   Prilosec  Benadryl  Rolaids       Chamomile Tea  Tums (Limit 4/day)     Unisom  Zantac       Tylenol PM         Warm milk-add vanilla or  Hemorrhoids:       Sugar for taste  Anusol/Anusol H.C.  (RX: Analapram 2.5%)  Sugar Substitutes:  Hydrocortisone OTC     Ok in moderation  Preparation H      Tucks        Vaseline lotion applied to tissue with wiping    Herpes:     Throat:  Acyclovir      Oragel  Famvir  Valtrex     Vaccines:         Flu Shot Leg Cramps:       *Gardasil  Benadryl      Hepatitis A         Hepatitis B Nasal Spray:       Pneumovax  Saline Nasal Spray     Polio Booster         Tetanus Nausea:       Tuberculosis test or PPD  Vitamin B6 25 mg TID   AVOID:    Dramamine      *Gardasil  Emetrol       Live Poliovirus  Ginger Root 250 mg QID    MMR (measles, mumps &  High Complex Carbs @ Bedtime    rebella)  Sea Bands-Accupressure    Varicella (Chickenpox)  Unisom 1/2 tab TID     *No known complications           If received before Pain:         Known pregnancy;   Darvocet       Resume series after  Lortab        Delivery  Percocet    Yeast:   Tramadol      Femstat  Tylenol 3      Gyne-lotrimin  Ultram       Monistat  Vicodin           MISC:         All Sunscreens           Hair Coloring/highlights          Insect Repellant's          (Including DEET)         Mystic Tans Third Trimester of Pregnancy The third trimester is from week 29 through week 42, months 7 through 9. This trimester is when your unborn baby (fetus) is growing very fast. At the end of the ninth month, the unborn baby is about 20  inches in length. It weighs about 6-10 pounds. Follow these instructions at home:  Avoid all smoking, herbs, and alcohol. Avoid drugs not approved by your doctor.  Do not use any tobacco products, including cigarettes, chewing tobacco, and electronic cigarettes. If you need help quitting, ask your doctor. You may get counseling or other support to help you quit.  Only take medicine as told by your doctor. Some medicines are safe and some are not during pregnancy.  Exercise only as told by your doctor. Stop exercising if you start having cramps.  Eat regular, healthy meals.  Wear a good support bra if your breasts are tender.  Do not use hot tubs, steam rooms, or saunas.  Wear your seat belt when driving.  Avoid raw meat, uncooked cheese, and liter boxes and soil used  by cats.  Take your prenatal vitamins.  Take 1500-2000 milligrams of calcium daily starting at the 20th week of pregnancy until you deliver your baby.  Try taking medicine that helps you poop (stool softener) as needed, and if your doctor approves. Eat more fiber by eating fresh fruit, vegetables, and whole grains. Drink enough fluids to keep your pee (urine) clear or pale yellow.  Take warm water baths (sitz baths) to soothe pain or discomfort caused by hemorrhoids. Use hemorrhoid cream if your doctor approves.  If you have puffy, bulging veins (varicose veins), wear support hose. Raise (elevate) your feet for 15 minutes, 3-4 times a day. Limit salt in your diet.  Avoid heavy lifting, wear low heels, and sit up straight.  Rest with your legs raised if you have leg cramps or low back pain.  Visit your dentist if you have not gone during your pregnancy. Use a soft toothbrush to brush your teeth. Be gentle when you floss.  You can have sex (intercourse) unless your doctor tells you not to.  Do not travel far distances unless you must. Only do so with your doctor's approval.  Take prenatal classes.  Practice  driving to the hospital.  Pack your hospital bag.  Prepare the baby's room.  Go to your doctor visits. Get help if:  You are not sure if you are in labor or if your water has broken.  You are dizzy.  You have mild cramps or pressure in your lower belly (abdominal).  You have a nagging pain in your belly area.  You continue to feel sick to your stomach (nauseous), throw up (vomit), or have watery poop (diarrhea).  You have bad smelling fluid coming from your vagina.  You have pain with peeing (urination). Get help right away if:  You have a fever.  You are leaking fluid from your vagina.  You are spotting or bleeding from your vagina.  You have severe belly cramping or pain.  You lose or gain weight rapidly.  You have trouble catching your breath and have chest pain.  You notice sudden or extreme puffiness (swelling) of your face, hands, ankles, feet, or legs.  You have not felt the baby move in over an hour.  You have severe headaches that do not go away with medicine.  You have vision changes. This information is not intended to replace advice given to you by your health care provider. Make sure you discuss any questions you have with your health care provider. Document Released: 07/13/2009 Document Revised: 09/24/2015 Document Reviewed: 06/19/2012 Elsevier Interactive Patient Education  2017 Else[employer] Inc.  Postpartum Tubal Ligation Postpartum tubal ligation (PPTL) is a procedure to close the fallopian tubes. This is done so that you cannot get pregnant. When the fallopian tubes are closed, the eggs that the ovaries release cannot enter the uterus, and sperm cannot reach the eggs. PPTL is done right after childbirth or 1-2 days after childbirth, before the uterus returns to its normal location. PPTL is sometimes called "getting your tubes tied." You should not have this procedure if you want to get pregnant someday or if you are unsure about having more  children. Tell a health care provider about:  Any allergies you have.  All medicines you are taking, including vitamins, herbs, eye drops, creams, and over-the-counter medicines.  Previous problems you or members of your family have had with the use of anesthetics.  Any blood disorders you have.  Previous surgeries you have had.  Any  medical conditions you may have.  Any past pregnancies. What are the risks? Generally, this is a safe procedure. However, problems may occur, including:  Infection.  Bleeding.  Injury to surrounding organs.  Side effects from anesthetics.  Failure of the procedure.  This procedure can increase your risk of a kind of pregnancy in which a fertilized egg attaches to the outside of the uterus (ectopic pregnancy). What happens before the procedure?  Ask your health care provider about: ? How much pain you can expect to have. ? What medicines you will be given for pain, especially if you are planning to breastfeed.  Follow instructions from your health care provider about eating and drinking restrictions. What happens during the procedure? If you had a vaginal delivery:  You may be given one or more of the following: ? A medicine that helps you relax (sedative). ? A medicine to numb the area (local anesthetic). ? A medicine to make you fall asleep (general anesthetic). ? A medicine that is injected into an area of your body to numb everything below the injection site (regional anesthetic).  If you have been given a general anesthetic, a tube will be put down your throat to help you breathe.  An IV tube will be inserted into one of your veins to give you medicines and fluids during the procedure.  Your bladder may be emptied with a small tube (catheter).  An incision will be made just below your belly button.  Your fallopian tubes will be located and brought up through the incision.  Your fallopian tubes will be tied off, burned  (cauterized), or blocked with a clip, ring, or clamp. A small portion in the center of each fallopian tube may be removed.  The incision will be closed with stitches (sutures).  A bandage (dressing) will be placed over the incision.  If you had a cesarean delivery:  Tubal ligation will be done through the incision that was used for the cesarean delivery of your baby.  The incision will be closed with sutures.  A dressing will be placed over the incision.  The procedure may vary among health care providers and hospitals. What happens after the procedure?  Your blood pressure, heart rate, breathing rate, and blood oxygen level will be monitored often until the medicines you were given have worn off.  You will be given pain medicine as needed.  Do not drive for 24 hours if you received a sedative. This information is not intended to replace advice given to you by your health care provider. Make sure you discuss any questions you have with your health care provider. Document Released: 04/18/2005 Document Revised: 09/21/2015 Document Reviewed: 03/29/2015 Elsevier Interactive Patient Education  [employer].

## 2018-01-12 LAB — CBC
Hematocrit: 34.1 % (ref 34.0–46.6)
Hemoglobin: 11.4 g/dL (ref 11.1–15.9)
MCH: 32.5 pg (ref 26.6–33.0)
MCHC: 33.4 g/dL (ref 31.5–35.7)
MCV: 97 fL (ref 79–97)
PLATELETS: 142 10*3/uL — AB (ref 150–450)
RBC: 3.51 x10E6/uL — ABNORMAL LOW (ref 3.77–5.28)
RDW: 13.1 % (ref 12.3–15.4)
WBC: 8.5 10*3/uL (ref 3.4–10.8)

## 2018-01-12 LAB — RPR: RPR: NONREACTIVE

## 2018-01-12 LAB — GLUCOSE, 1 HOUR GESTATIONAL: GESTATIONAL DIABETES SCREEN: 100 mg/dL (ref 65–139)

## 2018-01-15 NOTE — Progress Notes (Signed)
ROB-Doing well. 28 week labs today. TDaP given. [facility] Blood transfusion consent reviewed and signed. Third trimester education, see AVS. [facility]. Plans NCB with spouse/friends as support. Reviewed red flag symptoms and when to call. RTC x 2 weeks for ROB or sooner if needed.

## 2018-01-23 ENCOUNTER — Other Ambulatory Visit: Payer: Self-pay | Admitting: Obstetrics and Gynecology

## 2018-01-23 ENCOUNTER — Ambulatory Visit (INDEPENDENT_AMBULATORY_CARE_PROVIDER_SITE_OTHER): Payer: 59 | Admitting: Obstetrics and Gynecology

## 2018-01-23 VITALS — BP 120/83 | HR 86 | Wt 179.4 lb

## 2018-01-23 DIAGNOSIS — Z3493 Encounter for supervision of normal pregnancy, unspecified, third trimester: Secondary | ICD-10-CM | POA: Diagnosis not present

## 2018-01-23 LAB — POCT URINALYSIS DIPSTICK OB
Bilirubin, UA: NEGATIVE
GLUCOSE, UA: NEGATIVE
KETONES UA: NEGATIVE
Leukocytes, UA: NEGATIVE
NITRITE UA: NEGATIVE
PH UA: 7 (ref 5.0–8.0)
POC,PROTEIN,UA: NEGATIVE
RBC UA: NEGATIVE
Spec Grav, UA: 1.015 (ref 1.010–1.025)
Urobilinogen, UA: 0.2 E.U./dL

## 2018-01-23 MED ORDER — VITAMIN D (ERGOCALCIFEROL) 1.25 MG (50000 UNIT) PO CAPS: 50000.0000 [IU] | ORAL_CAPSULE | ORAL | 1 refills | Status: DC

## 2018-01-23 NOTE — Patient Instructions (Signed)
Vitamin D Deficiency °Vitamin D deficiency is when your body does not have enough vitamin D. Vitamin D is important to your body for many reasons: °· It helps the body to absorb two important minerals, called calcium and phosphorus. °· It plays a role in bone health. °· It may help to prevent some diseases, such as diabetes and multiple sclerosis. °· It plays a role in muscle function, including heart function. ° °You can get vitamin D by: °· Eating foods that naturally contain vitamin D. °· Eating or drinking milk or other dairy products that have vitamin D added to them. °· Taking a vitamin D supplement or a multivitamin supplement that contains vitamin D. °· Being in the sun. Your body naturally makes vitamin D when your skin is exposed to sunlight. Your body changes the sunlight into a form of the vitamin that the body can use. ° °If vitamin D deficiency is severe, it can cause a condition in which your bones become soft. In adults, this condition is called osteomalacia. In children, this condition is called rickets. °What are the causes? °Vitamin D deficiency may be caused by: °· Not eating enough foods that contain vitamin D. °· Not getting enough sun exposure. °· Having certain digestive system diseases that make it difficult for your body to absorb vitamin D. These diseases include Crohn disease, chronic pancreatitis, and cystic fibrosis. °· Having a surgery in which a part of the stomach or a part of the small intestine is removed. °· Being obese. °· Having chronic kidney disease or liver disease. ° °What increases the risk? °This condition is more likely to develop in: °· Older people. °· People who do not spend much time outdoors. °· People who live in a long-term care facility. °· People who have had broken bones. °· People with weak or thin bones (osteoporosis). °· People who have a disease or condition that changes how the body absorbs vitamin D. °· People who have dark skin. °· People who take certain  medicines, such as steroid medicines or certain seizure medicines. °· People who are overweight or obese. ° °What are the signs or symptoms? °In mild cases of vitamin D deficiency, there may not be any symptoms. If the condition is severe, symptoms may include: °· Bone pain. °· Muscle pain. °· Falling often. °· Broken bones caused by a minor injury. ° °How is this diagnosed? °This condition is usually diagnosed with a blood test. °How is this treated? °Treatment for this condition may depend on what caused the condition. Treatment options include: °· Taking vitamin D supplements. °· Taking a calcium supplement. Your health care provider will suggest what dose is best for you. ° °Follow these instructions at home: °· Take medicines and supplements only as told by your health care provider. °· Eat foods that contain vitamin D. Choices include: °? Fortified dairy products, cereals, or juices. Fortified means that vitamin D has been added to the food. Check the label on the package to be sure. °? Fatty fish, such as salmon or trout. °? Eggs. °? Oysters. °· Do not use a tanning bed. °· Maintain a healthy weight. Lose weight, if needed. °· Keep all follow-up visits as told by your health care provider. This is important. °Contact a health care provider if: °· Your symptoms do not go away. °· You feel like throwing up (nausea) or you throw up (vomit). °· You have fewer bowel movements than usual or it is difficult for you to have a   bowel movement (constipation). This information is not intended to replace advice given to you by your health care provider. Make sure you discuss any questions you have with your health care provider. Document Released: 07/11/2011 Document Revised: 09/30/2015 Document Reviewed: 09/03/2014 Elsevier Interactive Patient Education  2018 [employer]. Heartburn During Pregnancy Heartburn is pain or discomfort in the throat or chest. It may cause a burning feeling. It happens when stomach acid  moves up into the tube that carries food from your mouth to your stomach (esophagus). Heartburn is common during pregnancy. It usually goes away or gets better after giving birth. Follow these instructions at home: Eating and drinking  Do not drink alcohol while you are pregnant.  Figure out which foods and beverages make you feel worse, and avoid them.  Beverages that you may want to avoid include: ? Coffee and tea (with or without caffeine). ? Energy drinks and sports drinks. ? Bubbly (carbonated) drinks or sodas. ? Citrus fruit juices.  Foods that you may want to avoid include: ? Chocolate and cocoa. ? Peppermint and mint flavorings. ? Garlic, onions, and horseradish. ? Spicy and acidic foods. These include peppers, chili powder, curry powder, vinegar, hot sauces, and barbecue sauce. ? Citrus fruits, such as oranges, lemons, and limes. ? Tomato-based foods, such as red sauce, chili, and salsa. ? Fried and fatty foods, such as donuts, french fries, potato chips, and high-fat dressings. ? High-fat meats, such as hot dogs, cold cuts, sausage, ham, and bacon. ? High-fat dairy items, such as whole milk, butter, and cheese.  Eat small meals often, instead of large meals.  Avoid drinking a lot of liquid with your meals.  Avoid eating meals during the 2-3 hours before you go to bed.  Avoid lying down right after you eat.  Do not exercise right after you eat. Medicines  Take over-the-counter and prescription medicines only as told by your doctor.  Do not take aspirin, ibuprofen, or other NSAIDs unless your doctor tells you to do that.  Your doctor may tell you to avoid medicines that have sodium bicarbonate in them. General instructions  If told, raise the head of your bed about 6 inches (15 cm). You can do this by putting blocks under the legs. Sleeping with more pillows does not help with heartburn.  Do not use any products that contain nicotine or tobacco, such as cigarettes  and e-cigarettes. If you need help quitting, ask your doctor.  Wear loose-fitting clothing.  Try to lower your stress, such as with yoga or meditation. If you need help, ask your doctor.  Stay at a healthy weight. If you are overweight, work with your doctor to safely lose weight.  Keep all follow-up visits as told by your doctor. This is important. Contact a doctor if:  You get new symptoms.  Your symptoms do not get better with treatment.  You have weight loss and you do not know why.  You have trouble swallowing.  You make loud sounds when you breathe (wheeze).  You have a cough that does not go away.  You have heartburn often for more than 2 weeks.  You feel sick to your stomach (nauseous), and this does not get better with treatment.  You are throwing up (vomiting), and this does not get better with treatment.  You have pain in your belly (abdomen). Get help right away if:  You have very bad chest pain that spreads to your arm, neck, or jaw.  You feel sweaty, dizzy,  or light-headed.  You have trouble breathing.  You have pain when swallowing.  You throw up and your throw-up looks like blood or coffee grounds.  Your poop (stool) is bloody or black. This information is not intended to replace advice given to you by your health care provider. Make sure you discuss any questions you have with your health care provider. Document Released: 05/21/2010 Document Revised: 01/04/2016 Document Reviewed: 01/04/2016 Elsevier Interactive Patient Education  2017 [employer]

## 2018-01-23 NOTE — Progress Notes (Signed)
ROB- discussed GERD in pregnancy, and dietary changes needed.wil add zantac for breakthrough heartburn. Also encourage vit D supplements.

## 2018-01-23 NOTE — Progress Notes (Signed)
ROB- pt is doing well, she is taking protonix, wants to know if she can take bid

## 2018-02-09 ENCOUNTER — Ambulatory Visit (INDEPENDENT_AMBULATORY_CARE_PROVIDER_SITE_OTHER): Payer: 59 | Admitting: Certified Nurse Midwife

## 2018-02-09 VITALS — BP 118/83 | HR 105 | Wt 182.4 lb

## 2018-02-09 DIAGNOSIS — Z3493 Encounter for supervision of normal pregnancy, unspecified, third trimester: Secondary | ICD-10-CM

## 2018-02-09 LAB — POCT URINALYSIS DIPSTICK OB
BILIRUBIN UA: NEGATIVE
Blood, UA: NEGATIVE
Glucose, UA: NEGATIVE
KETONES UA: NEGATIVE
Leukocytes, UA: NEGATIVE
NITRITE UA: NEGATIVE
PROTEIN: NEGATIVE
Spec Grav, UA: 1.01 (ref 1.010–1.025)
Urobilinogen, UA: 0.2 E.U./dL
pH, UA: 6.5 (ref 5.0–8.0)

## 2018-02-09 NOTE — Progress Notes (Signed)
ROB doing well. No feels occasional contractions, reviewed PTL precaution. Has vaginal pressure but nothing that feels significantly different. Discussed repeating CBC @ 36 wks do to low platelets. She states she has had this with previous pregnancies. She verbalizes and agrees to plan.   Doreene Burke, CNM

## 2018-02-09 NOTE — Patient Instructions (Signed)

## 2018-02-09 NOTE — Progress Notes (Signed)
ROB, c/o intermittent UC's and vaginal pressure x1 month.

## 2018-02-22 ENCOUNTER — Ambulatory Visit (INDEPENDENT_AMBULATORY_CARE_PROVIDER_SITE_OTHER): Payer: 59 | Admitting: Obstetrics and Gynecology

## 2018-02-22 VITALS — BP 119/72 | HR 84 | Wt 183.7 lb

## 2018-02-22 DIAGNOSIS — Z3493 Encounter for supervision of normal pregnancy, unspecified, third trimester: Secondary | ICD-10-CM | POA: Diagnosis not present

## 2018-02-22 LAB — POCT URINALYSIS DIPSTICK OB
Bilirubin, UA: NEGATIVE
Glucose, UA: NEGATIVE
KETONES UA: NEGATIVE
LEUKOCYTES UA: NEGATIVE
Nitrite, UA: NEGATIVE
POC,PROTEIN,UA: NEGATIVE
RBC UA: NEGATIVE
SPEC GRAV UA: 1.01 (ref 1.010–1.025)
Urobilinogen, UA: 0.2 E.U./dL
pH, UA: 7 (ref 5.0–8.0)

## 2018-02-22 NOTE — Progress Notes (Signed)
ROB- pt is having some pelvic pressure 

## 2018-02-22 NOTE — Progress Notes (Signed)
ROB- reports irregular contractions with pressure. Will do pelvic rest until 37 weeks.PTL precautions.

## 2018-02-28 ENCOUNTER — Observation Stay
Admission: EM | Admit: 2018-02-28 | Discharge: 2018-02-28 | Disposition: A | Discharge status: Home / Self Care | Payer: 59 | Attending: Certified Nurse Midwife | Admitting: Certified Nurse Midwife

## 2018-02-28 ENCOUNTER — Other Ambulatory Visit: Payer: Self-pay

## 2018-02-28 DIAGNOSIS — Z79899 Other long term (current) drug therapy: Secondary | ICD-10-CM | POA: Insufficient documentation

## 2018-02-28 DIAGNOSIS — Z3A35 35 weeks gestation of pregnancy: Secondary | ICD-10-CM | POA: Diagnosis not present

## 2018-02-28 DIAGNOSIS — O4703 False labor before 37 completed weeks of gestation, third trimester: Secondary | ICD-10-CM | POA: Diagnosis not present

## 2018-02-28 LAB — GROUP B STREP BY PCR: Group B strep by PCR: POSITIVE — AB

## 2018-02-28 MED ORDER — TERBUTALINE SULFATE 1 MG/ML IJ SOLN
0.2500 mg | Freq: Once | INTRAMUSCULAR | Status: AC
  Administered 2018-02-28: 0.25 mg via SUBCUTANEOUS
  Filled 2018-02-28: qty 1

## 2018-02-28 NOTE — OB Triage Note (Signed)
   L&D OB Triage Note  SUBJECTIVE Paige Brown is a 32 y.o. [phone] female at [redacted]w[redacted]d, EDD Estimated Date of Delivery: 03/30/18 who presented to triage with complaints of contractions. She was working through the night and was very busy and noticed she was having regular uncomfortable contractions.   OB History  Gravida Para Term Preterm AB Living  4 3 3  0 0 3  SAB TAB Ectopic Multiple Live Births  0 0 0 0 3    # Outcome Date GA Lbr Len/2nd Weight Sex Delivery Anes PTL Lv  4 Current           3 Term 06/11/16 [redacted]w[redacted]d / 00:41 3118 g M Vag-Spont None  LIV     Name: Gauthier,BOY Kaesha     Apgar1: 8  Apgar5: 9  2 Term 09/11/14 [redacted]w[redacted]d  3771 g M   Y LIV  1 Term 10/14/11 [redacted]w[redacted]d  3459 g F CS-Unspec  N LIV    Obstetric Comments  1st pregnancy-breech  2nd pregnancy: PTL at 35 wks./ VBAC- no assistance    Medications Prior to Admission  Medication Sig Dispense Refill Last Dose  . pantoprazole (PROTONIX) 40 MG tablet TAKE 1 TABLET BY MOUTH DAILY. 30 tablet 2 02/27/2018 at Unknown time  . Prenatal Vit-Fe Fumarate-FA (PRENATAL VITAMINS) 28-0.8 MG TABS Take by mouth.   02/27/2018 at Unknown time  . Vitamin D, Ergocalciferol, (DRISDOL) 50000 units CAPS capsule Take 1 capsule (50,000 Units total) by mouth every 7 (seven) days. 30 capsule 1 02/27/2018 at Unknown time     OBJECTIVE  Nursing Evaluation:   BP 131/73 (BP Location: Left Arm)   Pulse (!) 113   Temp 97.8 F (36.6 C) (Oral)   Resp 18    Findings:  Preterm contractions   NST was performed and has been reviewed by me.  NST INTERPRETATION: Category I  Mode: External Baseline Rate (A): 125 bpm Variability: Moderate Accelerations: 15 x 15 Decelerations: None     Contraction Frequency (min): x1 uterine irritability  ASSESSMENT Impression:  1.  Pregnancy:  [id number] at [redacted]w[redacted]d , EDD Estimated Date of Delivery: 03/30/18 2.  NST:  Category I  3. Preterm contractions  PLAN 1. Reassurance given, contractions decreased ( 2 hr)  with PO hydration and one dose of terbutaline. GBS cultures collected.  2. Discharge home with standard labor precautions given to return to L&D or call the office for problems. 3. Continue routine prenatal care.    I was present to evaluate pt in person.   Doreene Burke, CNM

## 2018-02-28 NOTE — OB Triage Note (Signed)
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 Pt c/o contractions since 1am, approx 8-10 min apart. Denies LOF or vaginal bleeding. Reports good fetal movement. EFM applied and explained. Plan to monitor fetal and maternal well being and assess for labor. Pt was put on pelvic rest at 34/6, and is currently working one night a week. Intermittent FHR arrhythmia heard via EFM.

## 2018-03-01 ENCOUNTER — Ambulatory Visit (INDEPENDENT_AMBULATORY_CARE_PROVIDER_SITE_OTHER): Payer: 59 | Admitting: Certified Nurse Midwife

## 2018-03-01 VITALS — BP 115/83 | HR 97 | Wt 185.6 lb

## 2018-03-01 DIAGNOSIS — Z3493 Encounter for supervision of normal pregnancy, unspecified, third trimester: Secondary | ICD-10-CM | POA: Diagnosis not present

## 2018-03-01 NOTE — Progress Notes (Signed)
ROB, d/c from [facility] yesterday.  Pt c/o still having irregular UC's, no VB or ROM.

## 2018-03-01 NOTE — Patient Instructions (Signed)
Trial of Labor After Cesarean Delivery A trial of labor after cesarean delivery (TOLAC) is when a woman tries to give birth vaginally after a previous cesarean delivery. TOLAC may be a safe and appropriate option for you depending on your medical history and other risk factors. When TOLAC is successful and you are able to have a vaginal delivery, this is called a vaginal birth after cesarean delivery (VBAC). Candidates for TOLAC TOLAC is possible for some women who:  Have undergone one or two prior cesarean deliveries in which the incision of the uterus was horizontal (low transverse).  Are carrying twins and have had one prior low transverse incision during a cesarean delivery.  Do not have a vertical (classical) uterine scar.  Have not had a tear in the wall of their uterus (uterine rupture).  TOLAC is also supported for women who meet appropriate criteria and:  Are under the age of 40 years.  Are tall and have a body mass index (BMI) of less than 30.  Have an unknown uterine scar.  Give birth in a facility equipped to handle an emergency cesarean delivery. This team should be able to handle possible complications such as a uterine rupture.  Have thorough counseling about the benefits and risks of TOLAC.  Have discussed future pregnancy plans with their health care provider.  Plan to have several more pregnancies.  Most successful candidates for TOLAC:  Have had a successful vaginal delivery before or after their cesarean delivery.  Experience labor that begins naturally on or before the due date (40 weeks of gestation).  Do not have a very large (macrosomic) baby.  Had a prior cesarean delivery but are not currently experiencing factors that would prompt a cesarean delivery (such as a breech position).  Had only one prior cesarean delivery.  Had a prior cesarean delivery that was performed early in labor and not after full cervical dilation. TOLAC may be most appropriate  for women who meet the above guidelines and who plan to have more pregnancies. TOLAC is not recommended for home births. Least successful candidates for TOLAC:  Have an induced labor with an unfavorable cervix. An unfavorable cervix is when the cervix is not dilating enough (among other factors).  Have never had a vaginal delivery.  Have had more than two cesarean deliveries.  Have a pregnancy at more than 40 weeks of gestation.  Are pregnant with a baby with a suspected weight greater than 4,000 grams (8 pounds) and who have no prior history of a vaginal delivery.  Have closely spaced pregnancies. Suggested benefits of TOLAC  You may have a faster recovery time.  You may have a shorter stay in the hospital.  You may have less pain and fewer problems than with a cesarean delivery. Women who have a cesarean delivery have a higher chance of needing blood or getting a fever, an infection, or a blood clot in the legs. Suggested risks of TOLAC The highest risk of complications happens to women who attempt a TOLAC and fail. A failed TOLAC results in an unplanned cesarean delivery. Risks related to TOLAC or repeat cesarean deliveries include:  Blood loss.  Infection.  Blood clot.  Injury to surrounding tissues or organs.  Having to remove the uterus (hysterectomy).  Potential problems with the placenta (such as placenta previa or placenta accreta) in future pregnancies.  Although very rare, the main concerns with TOLAC are:  Rupture of the uterine scar from a past cesarean delivery.  Needing an emergency   cesarean delivery.  Having a bad outcome for the baby (perinatal morbidity).  Where to find more information:  American Congress of Obstetricians and Gynecologists: www.acog.org  American College of Nurse-Midwives: www.midwife.org This information is not intended to replace advice given to you by your health care provider. Make sure you discuss any questions you have with  your health care provider. Document Released: 01/04/2011 Document Revised: 03/16/2016 Document Reviewed: 10/08/2012 Elsevier Interactive Patient Education  2018 Elsevier Inc.  

## 2018-03-01 NOTE — Progress Notes (Signed)
ROB-Doing well, reports irregular contractions. 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 GBS positive at [facility]. GC/Ch urine today, see orders. Declines SVE. Encouraged pelvic rest. Next ER shift on Tuesday. Reviewed red flag symptoms and when to call. RTC x 1 week for ROB or sooner if needed.

## 2018-03-03 LAB — GC/CHLAMYDIA PROBE AMP
CHLAMYDIA, DNA PROBE: NEGATIVE
Neisseria gonorrhoeae by PCR: NEGATIVE

## 2018-03-08 ENCOUNTER — Ambulatory Visit (INDEPENDENT_AMBULATORY_CARE_PROVIDER_SITE_OTHER): Payer: 59 | Admitting: Certified Nurse Midwife

## 2018-03-08 VITALS — BP 127/91 | HR 92 | Wt 185.1 lb

## 2018-03-08 DIAGNOSIS — Z3493 Encounter for supervision of normal pregnancy, unspecified, third trimester: Secondary | ICD-10-CM | POA: Diagnosis not present

## 2018-03-08 LAB — POCT URINALYSIS DIPSTICK OB
Bilirubin, UA: NEGATIVE
Blood, UA: NEGATIVE
GLUCOSE, UA: NEGATIVE
KETONES UA: NEGATIVE
LEUKOCYTES UA: NEGATIVE
Nitrite, UA: NEGATIVE
POC,PROTEIN,UA: NEGATIVE
SPEC GRAV UA: 1.01 (ref 1.010–1.025)
UROBILINOGEN UA: 0.2 U/dL
pH, UA: 6 (ref 5.0–8.0)

## 2018-03-08 NOTE — Progress Notes (Signed)
Patient c/o irregular UC's, lower back pain that radiates to lower abdominal area.

## 2018-03-08 NOTE — Patient Instructions (Signed)
Vaginal Delivery Vaginal delivery means that you will give birth by pushing your baby out of your birth canal (vagina). A team of health care providers will help you before, during, and after vaginal delivery. Birth experiences are unique for every woman and every pregnancy, and birth experiences vary depending on where you choose to give birth. What should I do to prepare for my baby's birth? Before your baby is born, it is important to talk with your health care provider about:  Your labor and delivery preferences. These may include: ? Medicines that you may be given. ? How you will manage your pain. This might include non-medical pain relief techniques or injectable pain relief such as epidural analgesia. ? How you and your baby will be monitored during labor and delivery. ? Who may be in the labor and delivery room with you. ? Your feelings about surgical delivery of your baby (cesarean delivery, or C-section) if this becomes necessary. ? Your feelings about receiving donated blood through an IV tube (blood transfusion) if this becomes necessary.  Whether you are able: ? To take pictures or videos of the birth. ? To eat during labor and delivery. ? To move around, walk, or change positions during labor and delivery.  What to expect after your baby is born, such as: ? Whether delayed umbilical cord clamping and cutting is offered. ? Who will care for your baby right after birth. ? Medicines or tests that may be recommended for your baby. ? Whether breastfeeding is supported in your hospital or birth center. ? How long you will be in the hospital or birth center.  How any medical conditions you have may affect your baby or your labor and delivery experience.  To prepare for your baby's birth, you should also:  Attend all of your health care visits before delivery (prenatal visits) as recommended by your health care provider. This is important.  Prepare your home for your baby's  arrival. Make sure that you have: ? Diapers. ? Baby clothing. ? Feeding equipment. ? Safe sleeping arrangements for you and your baby.  Install a car seat in your vehicle. Have your car seat checked by a certified car seat installer to make sure that it is installed safely.  Think about who will help you with your new baby at home for at least the first several weeks after delivery.  What can I expect when I arrive at the birth center or hospital? Once you are in labor and have been admitted into the hospital or birth center, your health care provider may:  Review your pregnancy history and any concerns you have.  Insert an IV tube into one of your veins. This is used to give you fluids and medicines.  Check your blood pressure, pulse, temperature, and heart rate (vital signs).  Check whether your bag of water (amniotic sac) has broken (ruptured).  Talk with you about your birth plan and discuss pain control options.  Monitoring Your health care provider may monitor your contractions (uterine monitoring) and your baby's heart rate (fetal monitoring). You may need to be monitored:  Often, but not continuously (intermittently).  All the time or for long periods at a time (continuously). Continuous monitoring may be needed if: ? You are taking certain medicines, such as medicine to relieve pain or make your contractions stronger. ? You have pregnancy or labor complications.  Monitoring may be done by:  Placing a special stethoscope or a handheld monitoring device on your abdomen to   check your baby's heartbeat, and feeling your abdomen for contractions. This method of monitoring does not continuously record your baby's heartbeat or your contractions.  Placing monitors on your abdomen (external monitors) to record your baby's heartbeat and the frequency and length of contractions. You may not have to wear external monitors all the time.  Placing monitors inside of your uterus  (internal monitors) to record your baby's heartbeat and the frequency, length, and strength of your contractions. ? Your health care provider may use internal monitors if he or she needs more information about the strength of your contractions or your baby's heart rate. ? Internal monitors are put in place by passing a thin, flexible wire through your vagina and into your uterus. Depending on the type of monitor, it may remain in your uterus or on your baby's head until birth. ? Your health care provider will discuss the benefits and risks of internal monitoring with you and will ask for your permission before inserting the monitors.  Telemetry. This is a type of continuous monitoring that can be done with external or internal monitors. Instead of having to stay in bed, you are able to move around during telemetry. Ask your health care provider if telemetry is an option for you.  Physical exam Your health care provider may perform a physical exam. This may include:  Checking whether your baby is positioned: ? With the head toward your vagina (head-down). This is most common. ? With the head toward the top of your uterus (head-up or breech). If your baby is in a breech position, your health care provider may try to turn your baby to a head-down position so you can deliver vaginally. If it does not seem that your baby can be born vaginally, your provider may recommend surgery to deliver your baby. In rare cases, you may be able to deliver vaginally if your baby is head-up (breech delivery). ? Lying sideways (transverse). Babies that are lying sideways cannot be delivered vaginally.  Checking your cervix to determine: ? Whether it is thinning out (effacing). ? Whether it is opening up (dilating). ? How low your baby has moved into your birth canal.  What are the three stages of labor and delivery?  Normal labor and delivery is divided into the following three stages: Stage 1  Stage 1 is the  longest stage of labor, and it can last for hours or days. Stage 1 includes: ? Early labor. This is when contractions may be irregular, or regular and mild. Generally, early labor contractions are more than 10 minutes apart. ? Active labor. This is when contractions get longer, more regular, more frequent, and more intense. ? The transition phase. This is when contractions happen very close together, are very intense, and may last longer than during any other part of labor.  Contractions generally feel mild, infrequent, and irregular at first. They get stronger, more frequent (about every 2-3 minutes), and more regular as you progress from early labor through active labor and transition.  Many women progress through stage 1 naturally, but you may need help to continue making progress. If this happens, your health care provider may talk with you about: ? Rupturing your amniotic sac if it has not ruptured yet. ? Giving you medicine to help make your contractions stronger and more frequent.  Stage 1 ends when your cervix is completely dilated to 4 inches (10 cm) and completely effaced. This happens at the end of the transition phase. Stage 2  Once   your cervix is completely effaced and dilated to 4 inches (10 cm), you may start to feel an urge to push. It is common for the body to naturally take a rest before feeling the urge to push, especially if you received an epidural or certain other pain medicines. This rest period may last for up to 1-2 hours, depending on your unique labor experience.  During stage 2, contractions are generally less painful, because pushing helps relieve contraction pain. Instead of contraction pain, you may feel stretching and burning pain, especially when the widest part of your baby's head passes through the vaginal opening (crowning).  Your health care provider will closely monitor your pushing progress and your baby's progress through the vagina during stage 2.  Your  health care provider may massage the area of skin between your vaginal opening and anus (perineum) or apply warm compresses to your perineum. This helps it stretch as the baby's head starts to crown, which can help prevent perineal tearing. ? In some cases, an incision may be made in your perineum (episiotomy) to allow the baby to pass through the vaginal opening. An episiotomy helps to make the opening of the vagina larger to allow more room for the baby to fit through.  It is very important to breathe and focus so your health care provider can control the delivery of your baby's head. Your health care provider may have you decrease the intensity of your pushing, to help prevent perineal tearing.  After delivery of your baby's head, the shoulders and the rest of the body generally deliver very quickly and without difficulty.  Once your baby is delivered, the umbilical cord may be cut right away, or this may be delayed for 1-2 minutes, depending on your baby's health. This may vary among health care providers, hospitals, and birth centers.  If you and your baby are healthy enough, your baby may be placed on your chest or abdomen to help maintain the baby's temperature and to help you bond with each other. Some mothers and babies start breastfeeding at this time. Your health care team will dry your baby and help keep your baby warm during this time.  Your baby may need immediate care if he or she: ? Showed signs of distress during labor. ? Has a medical condition. ? Was born too early (prematurely). ? Had a bowel movement before birth (meconium). ? Shows signs of difficulty transitioning from being inside the uterus to being outside of the uterus. If you are planning to breastfeed, your health care team will help you begin a feeding. Stage 3  The third stage of labor starts immediately after the birth of your baby and ends after you deliver the placenta. The placenta is an organ that develops  during pregnancy to provide oxygen and nutrients to your baby in the womb.  Delivering the placenta may require some pushing, and you may have mild contractions. Breastfeeding can stimulate contractions to help you deliver the placenta.  After the placenta is delivered, your uterus should tighten (contract) and become firm. This helps to stop bleeding in your uterus. To help your uterus contract and to control bleeding, your health care provider may: ? Give you medicine by injection, through an IV tube, by mouth, or through your rectum (rectally). ? Massage your abdomen or perform a vaginal exam to remove any blood clots that are left in your uterus. ? Empty your bladder by placing a thin, flexible tube (catheter) into your bladder. ? Encourage   you to breastfeed your baby. After labor is over, you and your baby will be monitored closely to ensure that you are both healthy until you are ready to go home. Your health care team will teach you how to care for yourself and your baby. This information is not intended to replace advice given to you by your health care provider. Make sure you discuss any questions you have with your health care provider. Document Released: 01/26/2008 Document Revised: 11/06/2015 Document Reviewed: 05/03/2015 Elsevier Interactive Patient Education  2018 Elsevier Inc.  

## 2018-03-08 NOTE — Progress Notes (Signed)
Labor check/ROB-Reports irregular contractions, intermittent pelvic pressure and increased vaginal discharge. SVE unchanged from previous exam. Nitrazine negative. Discussed home treatment measures. Reviewed red flag symptoms and when to call. RTC x 1 week for ROB or sooner if needed.

## 2018-03-09 ENCOUNTER — Encounter: Payer: 59 | Admitting: Obstetrics and Gynecology

## 2018-03-10 ENCOUNTER — Encounter: Payer: Self-pay | Admitting: *Deleted

## 2018-03-10 ENCOUNTER — Observation Stay
Admission: EM | Admit: 2018-03-10 | Discharge: 2018-03-10 | Disposition: A | Discharge status: Home / Self Care | Payer: 59 | Attending: Obstetrics and Gynecology | Admitting: Obstetrics and Gynecology

## 2018-03-10 DIAGNOSIS — E559 Vitamin D deficiency, unspecified: Secondary | ICD-10-CM | POA: Insufficient documentation

## 2018-03-10 DIAGNOSIS — Z3490 Encounter for supervision of normal pregnancy, unspecified, unspecified trimester: Secondary | ICD-10-CM

## 2018-03-10 DIAGNOSIS — O368130 Decreased fetal movements, third trimester, not applicable or unspecified: Principal | ICD-10-CM | POA: Insufficient documentation

## 2018-03-10 DIAGNOSIS — Z3A37 37 weeks gestation of pregnancy: Secondary | ICD-10-CM | POA: Diagnosis not present

## 2018-03-10 DIAGNOSIS — Z3A33 33 weeks gestation of pregnancy: Secondary | ICD-10-CM | POA: Insufficient documentation

## 2018-03-10 NOTE — Discharge Instructions (Signed)
Come back if: ° °Big gush of fluids °Decreased fetal movement °Heavy vaginal bleeding °Temp over 100.4 °Contractions every 3-5 min lasting at least one hour ° °Get plenty of rest and stay well hydrated! °

## 2018-03-10 NOTE — ED Triage Notes (Signed)
Pt c/o contractions 5 minutes apart; G4P3; last seen by provider on 03/08/18; due date 03/30/18; denies vaginal bleeding; denies complications; positive fetal movement; pt taken to LDR via wheelchair by ED tech;

## 2018-03-10 NOTE — OB Triage Note (Signed)
Recvd pt from ED. Pt complains of contractions that started around 1900 and are about 5-7 min apart. Feeling baby move well. No vaginal bleeding or LOF. No complications with this pregnancy. Rates pain a 2-6 out of 10.

## 2018-03-13 NOTE — Discharge Summary (Signed)
Paige PriestRebecca Brown is a 32 y.o. [phone] at 7629w4d who is admitted for decreased fetal movement and irregular contractions all morning.  Estimated Date of Delivery: 03/30/18 Fetal presentation is cephalic.  Length of Stay:  0 Days. Admitted 03/10/2018  Subjective:  Patient reports good but less fetal movement.  She reports irregular and mild uterine contractions, no bleeding and no loss of fluid per vagina.  Vitals:  Blood pressure 127/90, pulse 82, temperature 98.1 F (36.7 C), temperature source Oral, resp. rate 16, currently breastfeeding. Physical Examination: CONSTITUTIONAL: Well-developed, well-nourished female in no acute distress.   NEUROLGIC: Alert and oriented to person, place, and time. Normal reflexes, muscle tone coordination. No cranial nerve deficit noted. PSYCHIATRIC: Normal mood and affect. Normal behavior. Normal judgment and thought content.  ABDOMEN: Soft, nontender, nondistended, gravid. CERVIX: Dilation: 1.5 Effacement (%): 50, 60 Station: -3 Presentation: Vertex Exam by:: MBS  Fetal monitoring: FHR: 144 bpm, Variability: moderate, Accelerations: Present, Decelerations: Absent  Uterine activity: 2 contractions per hour, mild to palpation per nurses.  No results found for this or any previous visit (from the past 48 hour(s)).  No results found.  Current scheduled medications   I have reviewed the patient's current medications.  ASSESSMENT: Patient Active Problem List   Diagnosis Date Noted  . Pregnancy 03/10/2018  . Labor and delivery, indication for care 02/28/2018  . Pelvic pain affecting pregnancy, antepartum 10/12/2017  . 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 GBS bacteriuria 09/19/2017  . Vaginal birth after cesarean (VBAC) 06/11/2016  . Vitamin D deficiency 04/12/2016  . Temporary low platelet count (HCC) 04/12/2016  . History of vaginal delivery following previous cesarean delivery 01/05/2016    PLAN: reassured of normal  findings and discharged home. Continue routine antenatal   Paige Brown, CNM [facility]

## 2018-03-15 ENCOUNTER — Ambulatory Visit (INDEPENDENT_AMBULATORY_CARE_PROVIDER_SITE_OTHER): Payer: 59 | Admitting: Obstetrics and Gynecology

## 2018-03-15 ENCOUNTER — Inpatient Hospital Stay
Admission: EM | Admit: 2018-03-15 | Discharge: 2018-03-17 | DRG: 807 | Disposition: A | Discharge status: Home / Self Care | Payer: 59 | Attending: Certified Nurse Midwife | Admitting: Certified Nurse Midwife

## 2018-03-15 ENCOUNTER — Other Ambulatory Visit: Payer: Self-pay

## 2018-03-15 VITALS — BP 113/87 | HR 90 | Wt 185.0 lb

## 2018-03-15 DIAGNOSIS — O34219 Maternal care for unspecified type scar from previous cesarean delivery: Secondary | ICD-10-CM | POA: Diagnosis present

## 2018-03-15 DIAGNOSIS — O878 Other venous complications in the puerperium: Principal | ICD-10-CM | POA: Diagnosis present

## 2018-03-15 DIAGNOSIS — Z3493 Encounter for supervision of normal pregnancy, unspecified, third trimester: Secondary | ICD-10-CM

## 2018-03-15 DIAGNOSIS — Z3A37 37 weeks gestation of pregnancy: Secondary | ICD-10-CM

## 2018-03-15 DIAGNOSIS — Z3483 Encounter for supervision of other normal pregnancy, third trimester: Secondary | ICD-10-CM | POA: Diagnosis present

## 2018-03-15 DIAGNOSIS — O99824 Streptococcus B carrier state complicating childbirth: Secondary | ICD-10-CM | POA: Diagnosis not present

## 2018-03-15 DIAGNOSIS — Z3A38 38 weeks gestation of pregnancy: Secondary | ICD-10-CM | POA: Diagnosis not present

## 2018-03-15 LAB — TYPE AND SCREEN
ABO/RH(D): O POS
Antibody Screen: NEGATIVE

## 2018-03-15 LAB — POCT URINALYSIS DIPSTICK OB
Bilirubin, UA: NEGATIVE
Blood, UA: NEGATIVE
GLUCOSE, UA: NEGATIVE
Ketones, UA: NEGATIVE
LEUKOCYTES UA: NEGATIVE
Nitrite, UA: NEGATIVE
POC,PROTEIN,UA: NEGATIVE
SPEC GRAV UA: 1.01 (ref 1.010–1.025)
Urobilinogen, UA: 0.2 E.U./dL
pH, UA: 6 (ref 5.0–8.0)

## 2018-03-15 LAB — CBC
HEMATOCRIT: 32.7 % — AB (ref 36.0–46.0)
HEMOGLOBIN: 11.2 g/dL — AB (ref 12.0–15.0)
MCH: 32 pg (ref 26.0–34.0)
MCHC: 34.3 g/dL (ref 30.0–36.0)
MCV: 93.4 fL (ref 80.0–100.0)
Platelets: 151 10*3/uL (ref 150–400)
RBC: 3.5 MIL/uL — AB (ref 3.87–5.11)
RDW: 13.6 % (ref 11.5–15.5)
WBC: 8.4 10*3/uL (ref 4.0–10.5)
nRBC: 0 % (ref 0.0–0.2)

## 2018-03-15 MED ORDER — SODIUM CHLORIDE FLUSH 0.9 % IV SOLN
INTRAVENOUS | Status: AC
  Filled 2018-03-15: qty 10

## 2018-03-15 MED ORDER — ACETAMINOPHEN 325 MG PO TABS: 650.0000 mg | ORAL_TABLET | ORAL | Status: DC | PRN

## 2018-03-15 MED ORDER — OXYTOCIN 40 UNITS IN LACTATED RINGERS INFUSION - SIMPLE MED: 2.5000 [IU]/h | INTRAVENOUS | Status: DC

## 2018-03-15 MED ORDER — LACTATED RINGERS IV SOLN: INTRAVENOUS | Status: DC

## 2018-03-15 MED ORDER — OXYTOCIN 10 UNIT/ML IJ SOLN: 10.0000 [IU] | Freq: Once | INTRAMUSCULAR | Status: DC

## 2018-03-15 MED ORDER — LIDOCAINE HCL (PF) 1 % IJ SOLN: 30.0000 mL | INTRAMUSCULAR | Status: DC | PRN

## 2018-03-15 MED ORDER — LACTATED RINGERS IV SOLN
500.0000 mL | INTRAVENOUS | Status: DC | PRN
  Administered 2018-03-16: 500 mL via INTRAVENOUS

## 2018-03-15 MED ORDER — LACTATED RINGERS IV SOLN: 500.0000 mL | INTRAVENOUS | Status: DC | PRN

## 2018-03-15 MED ORDER — LACTATED RINGERS IV SOLN
INTRAVENOUS | Status: DC
  Administered 2018-03-16: 06:00:00 via INTRAVENOUS

## 2018-03-15 MED ORDER — CLINDAMYCIN PHOSPHATE 900 MG/50ML IV SOLN
900.0000 mg | Freq: Three times a day (TID) | INTRAVENOUS | Status: DC
  Administered 2018-03-15 – 2018-03-16 (×2): 900 mg via INTRAVENOUS
  Filled 2018-03-15 (×3): qty 50

## 2018-03-15 MED ORDER — BUTORPHANOL TARTRATE 1 MG/ML IJ SOLN: 1.0000 mg | INTRAMUSCULAR | Status: DC | PRN

## 2018-03-15 MED ORDER — OXYTOCIN BOLUS FROM INFUSION
500.0000 mL | Freq: Once | INTRAVENOUS | Status: AC
  Administered 2018-03-16: 500 mL via INTRAVENOUS

## 2018-03-15 MED ORDER — ONDANSETRON HCL 4 MG/2ML IJ SOLN
4.0000 mg | Freq: Four times a day (QID) | INTRAMUSCULAR | Status: DC | PRN
  Administered 2018-03-15 – 2018-03-16 (×2): 4 mg via INTRAVENOUS
  Filled 2018-03-15 (×2): qty 2

## 2018-03-15 NOTE — H&P (Addendum)
History and Physical   HPI  Niamya Vittitow is a 32 y.o. [id number] at [redacted]w[redacted]d Estimated Date of Delivery: 03/30/18 who is being admitted for labor management.     OB History  OB History  Gravida Para Term Preterm AB Living  4 3 3  0 0 3  SAB TAB Ectopic Multiple Live Births  0 0 0 0 3    # Outcome Date GA Lbr Len/2nd Weight Sex Delivery Anes PTL Lv  4 Current           3 Term 06/11/16 [redacted]w[redacted]d / 00:41 3118 g M Vag-Spont None  LIV     Name: Rimmer,BOY Dorette     Apgar1: 8  Apgar5: 9  2 Term 09/11/14 [redacted]w[redacted]d  3771 g M   Y LIV  1 Term 10/14/11 [redacted]w[redacted]d  3459 g F CS-Unspec  N LIV    Obstetric Comments  1st pregnancy-breech  2nd pregnancy: PTL at 35 wks./ VBAC- no assistance    PROBLEM LIST  Pregnancy complications or risks: Patient Active Problem List   Diagnosis Date Noted  . Pregnancy 03/10/2018  . Labor and delivery, indication for care 02/28/2018  . Pelvic pain affecting pregnancy, antepartum 10/12/2017  . 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 GBS bacteriuria 09/19/2017  . Vaginal birth after cesarean (VBAC) 06/11/2016  . Vitamin D deficiency 04/12/2016  . Temporary low platelet count (HCC) 04/12/2016  . History of vaginal delivery following previous cesarean delivery 01/05/2016    Prenatal labs and studies: ABO, Rh: O/Positive/-- (05/17 1438) Antibody: Negative (05/17 1438) Rubella: 19.20 (05/17 1438) RPR: Non Reactive (09/12 0921)  HBsAg: Negative (05/17 1438)  HIV: Non Reactive (05/17 1438)  GBS: positive   Past Medical History:  Diagnosis Date  . Anemia    with pregnancies  . GERD (gastroesophageal reflux disease)    during pregnancy  . UTI (lower urinary tract infection) 11/09/2015   2 more days to complete ATB     Past Surgical History:  Procedure Laterality Date  . CESAREAN SECTION    . TONSILLECTOMY  1994   and adnoids  . WISDOM TOOTH EXTRACTION     2000     Medications    Current Discharge Medication List    CONTINUE  these medications which have NOT CHANGED   Details  pantoprazole (PROTONIX) 40 MG tablet TAKE 1 TABLET BY MOUTH DAILY. Qty: 30 tablet, Refills: 2    Prenatal Vit-Fe Fumarate-FA (PRENATAL VITAMINS) 28-0.8 MG TABS Take by mouth.    ranitidine (ZANTAC) 75 MG tablet Take 75 mg by mouth 2 (two) times daily.    Vitamin D, Ergocalciferol, (DRISDOL) 50000 units CAPS capsule Take 1 capsule (50,000 Units total) by mouth every 7 (seven) days. Qty: 30 capsule, Refills: 1         Allergies  Ciprofloxacin; Penicillins; Shellfish allergy; and Cefaclor  Review of Systems  Constitutional: negative Eyes: negative Ears, nose, mouth, throat, and face: negative Respiratory: negative Cardiovascular: negative Gastrointestinal: negative Genitourinary:negative Integument/breast: negative Hematologic/lymphatic: negative Musculoskeletal:negative Neurological: negative Behavioral/Psych: negative Endocrine: negative Allergic/Immunologic: negative  Physical Exam  BP 123/82   Pulse 100   Temp 99.9 F (37.7 C) (Oral)   Resp 16   Ht 5\' 6"  (1.676 m)   Wt 83.9 kg   BMI 29.86 kg/m   Lungs: clear bilaterally  Cardio: RRR  Abd: Soft, gravid, NT Presentation: cephalic CERVIX: Dilation: 6 Effacement (%): 80 Cervical Position: Posterior Station: -2 Presentation: Vertex Exam by:: A. Janee Morn Not currently in active labor, having  irregular contractions   See Prenatal records for more detailed PE.     FHR:  Baseline: 150 bpm, Variability: Good {> 6 bpm), Accelerations: Reactive and Decelerations: Absent  Toco: Uterine Contractions: irregular 3-5 min. Mild to moderate intensity    Test Results  Results for orders placed or performed in visit on 03/15/18 (from the past 24 hour(s))  POC Urinalysis Dipstick OB     Status: None   Collection Time: 03/15/18  9:51 AM  Result Value Ref Range   Color, UA dark yellow    Clarity, UA clear    Glucose, UA Negative Negative   Bilirubin, UA neg     Ketones, UA neg    Spec Grav, UA 1.010 1.010 - 1.025   Blood, UA neg    pH, UA 6.0 5.0 - 8.0   POC,PROTEIN,UA Negative Negative, Trace, Small (1+), Moderate (2+), Large (3+), 4+   Urobilinogen, UA 0.2 0.2 or 1.0 E.U./dL   Nitrite, UA neg    Leukocytes, UA Negative Negative   Appearance     Odor     Group B Strep positive  Assessment   G4P3003 at 5965w6d Estimated Date of Delivery: 03/30/18  The fetus is reassuring.    Patient Active Problem List   Diagnosis Date Noted  . Pregnancy 03/10/2018  . Labor and delivery, indication for care 02/28/2018  . Pelvic pain affecting pregnancy, antepartum 10/12/2017  . 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 GBS bacteriuria 09/19/2017  . Vaginal birth after cesarean (VBAC) 06/11/2016  . Vitamin D deficiency 04/12/2016  . Temporary low platelet count (HCC) 04/12/2016  . History of vaginal delivery following previous cesarean delivery 01/05/2016    Plan  1. Admit to L&D :   continue present management and IV antibiotics for GBS prophylaxis  2. EFM:-- Category 1 3. Epidural if desired.  Stadol for IV pain until epidural requested. 4. Admission labs  5.Anticipate NSVD   Dr.Evans consulted on plan of care.   Doreene Burkennie Maelynn Moroney, CNM  03/15/2018 7:53 PM

## 2018-03-15 NOTE — Progress Notes (Signed)
ROB- pt is having a lot of pelvic pressure 

## 2018-03-15 NOTE — Progress Notes (Signed)
ROB-reports irregular but painful contraction.discussed labor precautions

## 2018-03-16 DIAGNOSIS — Z3A38 38 weeks gestation of pregnancy: Secondary | ICD-10-CM

## 2018-03-16 DIAGNOSIS — O99824 Streptococcus B carrier state complicating childbirth: Secondary | ICD-10-CM

## 2018-03-16 MED ORDER — IBUPROFEN 600 MG PO TABS
600.0000 mg | ORAL_TABLET | Freq: Four times a day (QID) | ORAL | Status: DC
  Administered 2018-03-16 – 2018-03-17 (×5): 600 mg via ORAL
  Filled 2018-03-16 (×5): qty 1

## 2018-03-16 MED ORDER — AMMONIA AROMATIC IN INHA
RESPIRATORY_TRACT | Status: AC
  Filled 2018-03-16: qty 10

## 2018-03-16 MED ORDER — PRENATAL MULTIVITAMIN CH
1.0000 | ORAL_TABLET | Freq: Every day | ORAL | Status: DC
  Administered 2018-03-16 – 2018-03-17 (×2): 1 via ORAL
  Filled 2018-03-16 (×2): qty 1

## 2018-03-16 MED ORDER — BENZOCAINE-MENTHOL 20-0.5 % EX AERO
INHALATION_SPRAY | CUTANEOUS | Status: AC
  Administered 2018-03-16: 1 via TOPICAL
  Filled 2018-03-16: qty 56

## 2018-03-16 MED ORDER — ONDANSETRON HCL 4 MG/2ML IJ SOLN: 4.0000 mg | INTRAMUSCULAR | Status: DC | PRN

## 2018-03-16 MED ORDER — BENZOCAINE-MENTHOL 20-0.5 % EX AERO
1.0000 "application " | INHALATION_SPRAY | CUTANEOUS | Status: DC | PRN
  Administered 2018-03-16: 1 via TOPICAL

## 2018-03-16 MED ORDER — OXYTOCIN 10 UNIT/ML IJ SOLN
INTRAMUSCULAR | Status: AC
  Filled 2018-03-16: qty 1

## 2018-03-16 MED ORDER — DIPHENHYDRAMINE HCL 25 MG PO CAPS: 25.0000 mg | ORAL_CAPSULE | Freq: Four times a day (QID) | ORAL | Status: DC | PRN

## 2018-03-16 MED ORDER — TERBUTALINE SULFATE 1 MG/ML IJ SOLN: 0.2500 mg | Freq: Once | INTRAMUSCULAR | Status: DC | PRN

## 2018-03-16 MED ORDER — SODIUM CHLORIDE 0.9% FLUSH: 3.0000 mL | INTRAVENOUS | Status: DC | PRN

## 2018-03-16 MED ORDER — PANTOPRAZOLE SODIUM 40 MG PO TBEC: 40.0000 mg | DELAYED_RELEASE_TABLET | Freq: Every day | ORAL | Status: DC

## 2018-03-16 MED ORDER — ZOLPIDEM TARTRATE 5 MG PO TABS
ORAL_TABLET | ORAL | Status: AC
  Administered 2018-03-16: 5 mg
  Filled 2018-03-16: qty 1

## 2018-03-16 MED ORDER — ZOLPIDEM TARTRATE 5 MG PO TABS
5.0000 mg | ORAL_TABLET | Freq: Once | ORAL | Status: AC
  Administered 2018-03-16: 5 mg via ORAL

## 2018-03-16 MED ORDER — PANTOPRAZOLE SODIUM 40 MG PO TBEC
40.0000 mg | DELAYED_RELEASE_TABLET | Freq: Every day | ORAL | Status: DC
  Filled 2018-03-16 (×2): qty 1

## 2018-03-16 MED ORDER — OXYTOCIN 40 UNITS IN LACTATED RINGERS INFUSION - SIMPLE MED
1.0000 m[IU]/min | INTRAVENOUS | Status: DC
  Administered 2018-03-16: 6 m[IU]/min via INTRAVENOUS
  Administered 2018-03-16: 2 m[IU]/min via INTRAVENOUS

## 2018-03-16 MED ORDER — OXYTOCIN 40 UNITS IN LACTATED RINGERS INFUSION - SIMPLE MED
INTRAVENOUS | Status: AC
  Administered 2018-03-16: 6 m[IU]/min via INTRAVENOUS
  Filled 2018-03-16: qty 1000

## 2018-03-16 MED ORDER — SENNOSIDES-DOCUSATE SODIUM 8.6-50 MG PO TABS
2.0000 | ORAL_TABLET | ORAL | Status: DC
  Administered 2018-03-16: 2 via ORAL
  Filled 2018-03-16: qty 2

## 2018-03-16 MED ORDER — SODIUM CHLORIDE 0.9 % IV SOLN: 250.0000 mL | INTRAVENOUS | Status: DC | PRN

## 2018-03-16 MED ORDER — SIMETHICONE 80 MG PO CHEW: 80.0000 mg | CHEWABLE_TABLET | ORAL | Status: DC | PRN

## 2018-03-16 MED ORDER — ACETAMINOPHEN 325 MG PO TABS: 650.0000 mg | ORAL_TABLET | ORAL | Status: DC | PRN

## 2018-03-16 MED ORDER — DIBUCAINE 1 % RE OINT: 1.0000 "application " | TOPICAL_OINTMENT | RECTAL | Status: DC | PRN

## 2018-03-16 MED ORDER — SODIUM CHLORIDE 0.9% FLUSH: 3.0000 mL | Freq: Two times a day (BID) | INTRAVENOUS | Status: DC

## 2018-03-16 MED ORDER — WITCH HAZEL-GLYCERIN EX PADS: 1.0000 "application " | MEDICATED_PAD | CUTANEOUS | Status: DC | PRN

## 2018-03-16 MED ORDER — COCONUT OIL OIL: 1.0000 "application " | TOPICAL_OIL | Status: DC | PRN

## 2018-03-16 MED ORDER — ONDANSETRON HCL 4 MG PO TABS: 4.0000 mg | ORAL_TABLET | ORAL | Status: DC | PRN

## 2018-03-16 MED ORDER — MISOPROSTOL 200 MCG PO TABS
ORAL_TABLET | ORAL | Status: AC
  Filled 2018-03-16: qty 4

## 2018-03-16 NOTE — Progress Notes (Signed)
TOLAC protocol initiated for pt. Dr. Logan BoresEvans, anaesthesia Dr. Noralyn Pickarroll notified, and OR nurse notified. Dr. Logan BoresEvans and Serafina RoyalsMichelle Lawhorn, CNM in the department.

## 2018-03-16 NOTE — Lactation Note (Signed)
This note was copied from a baby's chart. Lactation Consultation Note  Patient Name: Boy Deirdre PriestRebecca Bi ZOXWR'UToday's Date: 03/16/2018 Reason for consult: Initial assessment;Early term 37-38.6wks Assisted mom with pillow support in comfortable position.  Initially Anette Riedeloah was screaming and was refusing to latch.  Once he was calmed, he latched with minimal assistance and began strong rhythmic sucking with frequent swallows. He would come off and fuss for short interval and then latch again with good sucking. Mom denies any pain and presently has no questions.  Mom reports that she was still breast feeding her now 3621 month old when she got pregnant with [place].  Mom denies need for DEBP stating she still had 2 pumps from previous pregnancies.  Reviewed supply and demand, routine newborn feeding patterns and normal course of lactation.      Maternal Data Formula Feeding for Exclusion: No Has patient been taught Hand Expression?: Yes Does the patient have breastfeeding experience prior to this delivery?: Yes  Feeding Feeding Type: Breast Fed  LATCH Score Latch: Repeated attempts needed to sustain latch, nipple held in mouth throughout feeding, stimulation needed to elicit sucking reflex.  Audible Swallowing: A few with stimulation  Type of Nipple: Everted at rest and after stimulation  Comfort (Breast/Nipple): Soft / non-tender  Hold (Positioning): No assistance needed to correctly position infant at breast.  LATCH Score: 8  Interventions Interventions: Breast feeding basics reviewed;Assisted with latch;Skin to skin;Breast compression;Breast massage;Adjust position;Support pillows  Lactation Tools Discussed/Used WIC Program: No   Consult Status Consult Status: PRN    Louis MeckelWilliams, Mekhi Sonn Kay 03/16/2018, 1:52 PM

## 2018-03-16 NOTE — Progress Notes (Signed)
Patient ID: Paige PriestRebecca Mckiver, female   DOB: 1985/07/30, 32 y.o.   MRN: [id number]  Paige PriestRebecca Tunks is a 32 y.o. G4P3003 at 295w0d by ultrasound admitted.  Subjective:  Patient sitting in bed with spouse at bedside for continuous labor support. Reports occasional contractions.   Denies difficulty breathing or respiratory distress, chest pain, excessive vaginal bleeding, dysuria, and leg pain or swelling.   Objective:  Temp:  [97.4 F (36.3 C)-99.9 F (37.7 C)] 98.2 F (36.8 C) (11/15 0710) Pulse Rate:  [83-103] 103 (11/15 0710) Resp:  [16] 16 (11/14 1733) BP: (102-126)/(60-85) 121/85 (11/15 0710) Weight:  [83.9 kg] 83.9 kg (11/14 1733)  Fetal Wellbeing:  Category I  UC:   regular, every two (2) to four (4) minutes; pitocin infusing at 4 mu/min  SVE:   Dilation: 6.5 Effacement (%): 90 Station: -2 Exam by:: A.Thompson  Labs: Lab Results  Component Value Date   WBC 8.4 03/15/2018   HGB 11.2 (L) 03/15/2018   HCT 32.7 (L) 03/15/2018   MCV 93.4 03/15/2018   PLT 151 03/15/2018    Assessment:  32 year old G4P3 265w0d admitted AROM, pitocin augmentation, GBS positive, history of cesarean section followed by two (2) vaginal births  FHR Category I  Plan:  Encouraged position change and use of peanut ball.   Continue orders as written. Reassess needed.    Gunnar BullaJenkins Michelle Enzio Buchler, CNM [facility], [facility] 03/16/2018, 8:39 AM

## 2018-03-16 NOTE — Progress Notes (Signed)
LABOR NOTE   Paige PriestRebecca Bertsch 32 y.o.@ at 374w0d Prolonged latent labor. and Inadequate uterine activity - intensity or frequency.  SUBJECTIVE:  Tolerating contractions  OBJECTIVE:  BP 102/60   Pulse 83   Temp 98.6 F (37 C) (Oral)   Resp 16   Ht 5\' 6"  (1.676 m)   Wt 83.9 kg   BMI 29.86 kg/m  No intake/output data recorded.  She has not shown cervical change. CERVIX: 6:  90%:   -2:   posterior:   soft SVE:   Dilation: 6 Effacement (%): 90 Station: -2 Exam by:: A. Afton Lavalle CONTRACTIONS: irregular, every 7-10 minutes FHR: Fetal heart tracing reviewed. Baseline: 140 bpm, Variability: Good {> 6 bpm), Accelerations: Non-reactive but appropriate for gestational age and Decelerations: Early Category II   Analgesia: Labor support without medications  Labs: Lab Results  Component Value Date   WBC 8.4 03/15/2018   HGB 11.2 (L) 03/15/2018   HCT 32.7 (L) 03/15/2018   MCV 93.4 03/15/2018   PLT 151 03/15/2018    ASSESSMENT: 1) Labor curve reviewed.       Progress: Prolonged latent labor. and Inadequate uterine activity - intensity or frequency.     Membranes: ruptured, pink/bloody       2)  Second dose of antibiotics completed   Active Problems:   Labor and delivery, indication for care   PLAN: continue present management  Doreene Burkennie Chriselda Leppert, CNM  03/16/2018 6:04 AM

## 2018-03-17 LAB — CBC
HEMATOCRIT: 29.2 % — AB (ref 36.0–46.0)
HEMOGLOBIN: 9.7 g/dL — AB (ref 12.0–15.0)
MCH: 31.8 pg (ref 26.0–34.0)
MCHC: 33.2 g/dL (ref 30.0–36.0)
MCV: 95.7 fL (ref 80.0–100.0)
Platelets: 129 10*3/uL — ABNORMAL LOW (ref 150–400)
RBC: 3.05 MIL/uL — AB (ref 3.87–5.11)
RDW: 13.7 % (ref 11.5–15.5)
WBC: 8.6 10*3/uL (ref 4.0–10.5)
nRBC: 0 % (ref 0.0–0.2)

## 2018-03-17 LAB — RPR: RPR Ser Ql: NONREACTIVE

## 2018-03-17 MED ORDER — WITCH HAZEL-GLYCERIN EX PADS: 1.0000 "application " | MEDICATED_PAD | CUTANEOUS | 12 refills | Status: DC | PRN

## 2018-03-17 MED ORDER — FERROUS SULFATE 325 (65 FE) MG PO TABS: 325.0000 mg | ORAL_TABLET | Freq: Three times a day (TID) | ORAL | Status: DC

## 2018-03-17 MED ORDER — SENNOSIDES-DOCUSATE SODIUM 8.6-50 MG PO TABS: 2.0000 | ORAL_TABLET | ORAL | 0 refills | Status: DC

## 2018-03-17 MED ORDER — IBUPROFEN 600 MG PO TABS: 600.0000 mg | ORAL_TABLET | Freq: Four times a day (QID) | ORAL | 0 refills | Status: DC

## 2018-03-17 MED ORDER — FERROUS SULFATE 325 (65 FE) MG PO TABS: 325.0000 mg | ORAL_TABLET | Freq: Three times a day (TID) | ORAL | 3 refills | Status: DC

## 2018-03-17 NOTE — Discharge Summary (Signed)
Obstetric Discharge Summary  Patient ID: Paige PriestRebecca Brown MRN: [id number] DOB/AGE: Feb 07, 1986 32 y.o.   Date of Admission: 03/15/2018  Date of Discharge:  03/17/18  Admitting Diagnosis: Augmentation of labor at 7549w0d  Secondary Diagnosis: Group Beta Strep Positive  Mode of Delivery: Spontaneous vaginal birth after cesarean section, Rh positive, GBS positive     Discharge Diagnosis: No other diagnosis   Intrapartum Procedures: Atificial rupture of membranes and pitocin augmentation   Post partum procedures: None  Complications: First degree perineal laceration, unrepaired   Brief Hospital Course   Paige Brown is a [id number] who had a SVD on 03/16/2018;  for further details of this birth, please refer to the delivey note.  Patient had an uncomplicated postpartum course.  By time of discharge on PPD#1, her pain was controlled on oral pain medications; she had appropriate lochia and was ambulating, voiding without difficulty and tolerating regular diet.  She was deemed stable for discharge to home.    Labs: CBC Latest Ref Rng & Units 03/17/2018 03/15/2018 01/11/2018  WBC 4.0 - 10.5 K/uL 8.6 8.4 8.5  Hemoglobin 12.0 - 15.0 g/dL 1.4(N9.7(L) 11.2(L) 11.4  Hematocrit 36.0 - 46.0 % 29.2(L) 32.7(L) 34.1  Platelets 150 - 400 K/uL 129(L) 151 142(L)   O POS  Physical exam:   Temp:  [97.5 F (36.4 C)-98.7 F (37.1 C)] 97.6 F (36.4 C) (11/16 0809) Pulse Rate:  [60-97] 60 (11/16 0809) Resp:  [16-18] 18 (11/16 0809) BP: (99-131)/(68-89) 123/89 (11/16 0809) SpO2:  [98 %-100 %] 100 % (11/16 0809)  General: alert and no distress  Lochia: appropriate  Abdomen: soft, NT  Uterine Fundus: firm  Perineum: healing well, no significant drainage, no dehiscence, no significant erythema  Extremities: No evidence of DVT seen on physical exam. No lower extremity edema.  Discharge Instructions: Per After Visit Summary.  Activity: Advance as tolerated. Pelvic rest for 6 weeks.  Also refer  to After Visit Summary  Diet: Regular  Medications: Allergies as of 03/17/2018      Reactions   Ciprofloxacin Nausea And Vomiting   Penicillins Hives   Shellfish Allergy Swelling   Cefaclor Hives      Medication List    STOP taking these medications   ranitidine 75 MG tablet Commonly known as:  ZANTAC     TAKE these medications   ferrous sulfate 325 (65 FE) MG tablet Take 1 tablet (325 mg total) by mouth 3 (three) times daily with meals.   ibuprofen 600 MG tablet Commonly known as:  ADVIL,MOTRIN Take 1 tablet (600 mg total) by mouth every 6 (six) hours.   pantoprazole 40 MG tablet Commonly known as:  PROTONIX TAKE 1 TABLET BY MOUTH DAILY.   Prenatal Vitamins 28-0.8 MG Tabs Take by mouth.   senna-docusate 8.6-50 MG tablet Commonly known as:  Senokot-S Take 2 tablets by mouth daily. Start taking on:  03/18/2018   Vitamin D (Ergocalciferol) 1.25 MG (50000 UT) Caps capsule Commonly known as:  DRISDOL Take 1 capsule (50,000 Units total) by mouth every 7 (seven) days.   witch hazel-glycerin pad Commonly known as:  TUCKS Apply 1 application topically as needed for hemorrhoids.      Outpatient follow up:  Follow-up Information    Gunnar BullaLawhorn, [place] M[place] Michelle, CNM. Call in 4 week(s).   Specialties:  Certified Nurse Midwife, Obstetrics and Gynecology, Radiology Why:  Please call to schedule four (4) to six (6) week postpartum visit with [facility] Contact information: [address] [place] [id number] [phone]  Postpartum contraception: condoms, vasectomy  Discharged Condition: stable  Discharged to: home   Newborn Data:  Disposition:home with mother  Apgars: APGAR (1 MIN): 9   APGAR (5 MINS): 9    Baby Feeding: Breast   Gunnar Bulla, CNM [facility], [facility] 03/17/18 12:41 PM

## 2018-03-17 NOTE — Progress Notes (Signed)
Reviewed D/C instructions with pt and family. Pt verbalized understanding of teaching. Discharged to home via W/C. Pt to schedule f/u appt.  

## 2018-03-17 NOTE — Discharge Instructions (Signed)
Breastfeeding Choosing to breastfeed is one of the best decisions you can make for yourself and your baby. A change in hormones during pregnancy causes your breasts to make breast milk in your milk-producing glands. Hormones prevent breast milk from being released before your baby is born. They also prompt milk flow after birth. Once breastfeeding has begun, thoughts of your baby, as well as his or her sucking or crying, can stimulate the release of milk from your milk-producing glands. Benefits of breastfeeding Research shows that breastfeeding offers many health benefits for infants and mothers. It also offers a cost-free and convenient way to feed your baby. For your baby  Your first milk (colostrum) helps your baby's digestive system to function better.  Special cells in your milk (antibodies) help your baby to fight off infections.  Breastfed babies are less likely to develop asthma, allergies, obesity, or type 2 diabetes. They are also at lower risk for sudden infant death syndrome (SIDS).  Nutrients in breast milk are better able to meet your babys needs compared to infant formula.  Breast milk improves your baby's brain development. For you  Breastfeeding helps to create a very special bond between you and your baby.  Breastfeeding is convenient. Breast milk costs nothing and is always available at the correct temperature.  Breastfeeding helps to burn calories. It helps you to lose the weight that you gained during pregnancy.  Breastfeeding makes your uterus return faster to its size before pregnancy. It also slows bleeding (lochia) after you give birth.  Breastfeeding helps to lower your risk of developing type 2 diabetes, osteoporosis, rheumatoid arthritis, cardiovascular disease, and breast, ovarian, uterine, and endometrial cancer later in life. Breastfeeding basics Starting breastfeeding  Find a comfortable place to sit or lie down, with your neck and back  well-supported.  Place a pillow or a rolled-up blanket under your baby to bring him or her to the level of your breast (if you are seated). Nursing pillows are specially designed to help support your arms and your baby while you breastfeed.  Make sure that your baby's tummy (abdomen) is facing your abdomen.  Gently massage your breast. With your fingertips, massage from the outer edges of your breast inward toward the nipple. This encourages milk flow. If your milk flows slowly, you may need to continue this action during the feeding.  Support your breast with 4 fingers underneath and your thumb above your nipple (make the letter "C" with your hand). Make sure your fingers are well away from your nipple and your babys mouth.  Stroke your baby's lips gently with your finger or nipple.  When your baby's mouth is open wide enough, quickly bring your baby to your breast, placing your entire nipple and as much of the areola as possible into your baby's mouth. The areola is the colored area around your nipple. ? More areola should be visible above your baby's upper lip than below the lower lip. ? Your baby's lips should be opened and extended outward (flanged) to ensure an adequate, comfortable latch. ? Your baby's tongue should be between his or her lower gum and your breast.  Make sure that your baby's mouth is correctly positioned around your nipple (latched). Your baby's lips should create a seal on your breast and be turned out (everted).  It is common for your baby to suck about 2-3 minutes in order to start the flow of breast milk. Latching Teaching your baby how to latch onto your breast properly is very  important. An improper latch can cause nipple pain, decreased milk supply, and poor weight gain in your baby. Also, if your baby is not latched onto your nipple properly, he or she may swallow some air during feeding. This can make your baby fussy. Burping your baby when you switch breasts  during the feeding can help to get rid of the air. However, teaching your baby to latch on properly is still the best way to prevent fussiness from swallowing air while breastfeeding. °Signs that your baby has successfully latched onto your nipple °· Silent tugging or silent sucking, without causing you pain. Infant's lips should be extended outward (flanged). °· Swallowing heard between every 3-4 sucks once your milk has started to flow (after your let-down milk reflex occurs). °· Muscle movement above and in front of his or her ears while sucking. ° °Signs that your baby has not successfully latched onto your nipple °· Sucking sounds or smacking sounds from your baby while breastfeeding. °· Nipple pain. ° °If you think your baby has not latched on correctly, slip your finger into the corner of your baby’s mouth to break the suction and place it between your baby's gums. Attempt to start breastfeeding again. °Signs of successful breastfeeding °Signs from your baby °· Your baby will gradually decrease the number of sucks or will completely stop sucking. °· Your baby will fall asleep. °· Your baby's body will relax. °· Your baby will retain a small amount of milk in his or her mouth. °· Your baby will let go of your breast by himself or herself. ° °Signs from you °· Breasts that have increased in firmness, weight, and size 1-3 hours after feeding. °· Breasts that are softer immediately after breastfeeding. °· Increased milk volume, as well as a change in milk consistency and color by the fifth day of breastfeeding. °· Nipples that are not sore, cracked, or bleeding. ° °Signs that your baby is getting enough milk °· Wetting at least 1-2 diapers during the first 24 hours after birth. °· Wetting at least 5-6 diapers every 24 hours for the first week after birth. The urine should be clear or pale yellow by the age of 5 days. °· Wetting 6-8 diapers every 24 hours as your baby continues to grow and develop. °· At least 3  stools in a 24-hour period by the age of 5 days. The stool should be soft and yellow. °· At least 3 stools in a 24-hour period by the age of 7 days. The stool should be seedy and yellow. °· No loss of weight greater than 10% of birth weight during the first 3 days of life. °· Average weight gain of 4-7 oz (113-198 g) per week after the age of 4 days. °· Consistent daily weight gain by the age of 5 days, without weight loss after the age of 2 weeks. °After a feeding, your baby may spit up a small amount of milk. This is normal. °Breastfeeding frequency and duration °Frequent feeding will help you make more milk and can prevent sore nipples and extremely full breasts (breast engorgement). Breastfeed when you feel the need to reduce the fullness of your breasts or when your baby shows signs of hunger. This is called "breastfeeding on demand." Signs that your baby is hungry include: °· Increased alertness, activity, or restlessness. °· Movement of the head from side to side. °· Opening of the mouth when the corner of the mouth or cheek is stroked (rooting). °· Increased sucking sounds,   smacking lips, cooing, sighing, or squeaking. °· Hand-to-mouth movements and sucking on fingers or hands. °· Fussing or crying. ° °Avoid introducing a pacifier to your baby in the first 4-6 weeks after your baby is born. After this time, you may choose to use a pacifier. Research has shown that pacifier use during the first year of a baby's life decreases the risk of sudden infant death syndrome (SIDS). °Allow your baby to feed on each breast as long as he or she wants. When your baby unlatches or falls asleep while feeding from the first breast, offer the second breast. Because newborns are often sleepy in the first few weeks of life, you may need to awaken your baby to get him or her to feed. °Breastfeeding times will vary from baby to baby. However, the following rules can serve as a guide to help you make sure that your baby is  properly fed: °· Newborns (babies 4 weeks of age or younger) may breastfeed every 1-3 hours. °· Newborns should not go without breastfeeding for longer than 3 hours during the day or 5 hours during the night. °· You should breastfeed your baby a minimum of 8 times in a 24-hour period. ° °Breast milk pumping °Pumping and storing breast milk allows you to make sure that your baby is exclusively fed your breast milk, even at times when you are unable to breastfeed. This is especially important if you go back to work while you are still breastfeeding, or if you are not able to be present during feedings. Your lactation consultant can help you find a method of pumping that works best for you and give you guidelines about how long it is safe to store breast milk. °Caring for your breasts while you breastfeed °Nipples can become dry, cracked, and sore while breastfeeding. The following recommendations can help keep your breasts moisturized and healthy: °· Avoid using soap on your nipples. °· Wear a supportive bra designed especially for nursing. Avoid wearing underwire-style bras or extremely tight bras (sports bras). °· Air-dry your nipples for 3-4 minutes after each feeding. °· Use only cotton bra pads to absorb leaked breast milk. Leaking of breast milk between feedings is normal. °· Use lanolin on your nipples after breastfeeding. Lanolin helps to maintain your skin's normal moisture barrier. Pure lanolin is not harmful (not toxic) to your baby. You may also hand express a few drops of breast milk and gently massage that milk into your nipples and allow the milk to air-dry. ° °In the first few weeks after giving birth, some women experience breast engorgement. Engorgement can make your breasts feel heavy, warm, and tender to the touch. Engorgement peaks within 3-5 days after you give birth. The following recommendations can help to ease engorgement: °· Completely empty your breasts while breastfeeding or pumping. You  may want to start by applying warm, moist heat (in the shower or with warm, water-soaked hand towels) just before feeding or pumping. This increases circulation and helps the milk flow. If your baby does not completely empty your breasts while breastfeeding, pump any extra milk after he or she is finished. °· Apply ice packs to your breasts immediately after breastfeeding or pumping, unless this is too uncomfortable for you. To do this: °? Put ice in a plastic bag. °? Place a towel between your skin and the bag. °? Leave the ice on for 20 minutes, 2-3 times a day. °· Make sure that your baby is latched on and positioned properly while   breastfeeding. ° °If engorgement persists after 48 hours of following these recommendations, contact your health care provider or a lactation consultant. °Overall health care recommendations while breastfeeding °· Eat 3 healthy meals and 3 snacks every day. Well-nourished mothers who are breastfeeding need an additional 450-500 calories a day. You can meet this requirement by increasing the amount of a balanced diet that you eat. °· Drink enough water to keep your urine pale yellow or clear. °· Rest often, relax, and continue to take your prenatal vitamins to prevent fatigue, stress, and low vitamin and mineral levels in your body (nutrient deficiencies). °· Do not use any products that contain nicotine or tobacco, such as cigarettes and e-cigarettes. Your baby may be harmed by chemicals from cigarettes that pass into breast milk and exposure to secondhand smoke. If you need help quitting, ask your health care provider. °· Avoid alcohol. °· Do not use illegal drugs or marijuana. °· Talk with your health care provider before taking any medicines. These include over-the-counter and prescription medicines as well as vitamins and herbal supplements. Some medicines that may be harmful to your baby can pass through breast milk. °· It is possible to become pregnant while breastfeeding. If  birth control is desired, ask your health care provider about options that will be safe while breastfeeding your baby. °Where to find more information: °[employer]: www.llli.org °Contact a health care provider if: °· You feel like you want to stop breastfeeding or have become frustrated with breastfeeding. °· Your nipples are cracked or bleeding. °· Your breasts are red, tender, or warm. °· You have: °? Painful breasts or nipples. °? A swollen area on either breast. °? A fever or chills. °? Nausea or vomiting. °? Drainage other than breast milk from your nipples. °· Your breasts do not become full before feedings by the fifth day after you give birth. °· You feel sad and depressed. °· Your baby is: °? Too sleepy to eat well. °? Having trouble sleeping. °? More than 1 week old and wetting fewer than 6 diapers in a 24-hour period. °? Not gaining weight by 5 days of age. °· Your baby has fewer than 3 stools in a 24-hour period. °· Your baby's skin or the white parts of his or her eyes become yellow. °Get help right away if: °· Your baby is overly tired (lethargic) and does not want to wake up and feed. °· Your baby develops an unexplained fever. °Summary °· Breastfeeding offers many health benefits for infant and mothers. °· Try to breastfeed your infant when he or she shows early signs of hunger. °· Gently tickle or stroke your baby's lips with your finger or nipple to allow the baby to open his or her mouth. Bring the baby to your breast. Make sure that much of the areola is in your baby's mouth. Offer one side and burp the baby before you offer the other side. °· Talk with your health care provider or lactation consultant if you have questions or you face problems as you breastfeed. °This information is not intended to replace advice given to you by your health care provider. Make sure you discuss any questions you have with your health care provider. °Document Released: 04/18/2005 Document  Revised: 05/20/2016 Document Reviewed: 05/20/2016 °[employer] Interactive Patient Education © 2018 [employer] °Vaginal Delivery, Care After °Refer to this sheet in the next few weeks. These instructions provide you with information about caring for yourself after vaginal delivery. Your health care   provider may also give you more specific instructions. Your treatment has been planned according to current medical practices, but problems sometimes occur. Call your health care provider if you have any problems or questions. °What can I expect after the procedure? °After vaginal delivery, it is common to have: °· Some bleeding from your vagina. °· Soreness in your abdomen, your vagina, and the area of skin between your vaginal opening and your anus (perineum). °· Pelvic cramps. °· Fatigue. ° °Follow these instructions at home: °Medicines °· Take over-the-counter and prescription medicines only as told by your health care provider. °· If you were prescribed an antibiotic medicine, take it as told by your health care provider. Do not stop taking the antibiotic until it is finished. °Driving ° °· Do not drive or operate heavy machinery while taking prescription pain medicine. °· Do not drive for 24 hours if you received a sedative. °Lifestyle °· Do not drink alcohol. This is especially important if you are breastfeeding or taking medicine to relieve pain. °· Do not use tobacco products, including cigarettes, chewing tobacco, or e-cigarettes. If you need help quitting, ask your health care provider. °Eating and drinking °· Drink at least 8 eight-ounce glasses of water every day unless you are told not to by your health care provider. If you choose to breastfeed your baby, you may need to drink more water than this. °· Eat high-fiber foods every day. These foods may help prevent or relieve constipation. High-fiber foods include: °? Whole grain cereals and breads. °? Brown rice. °? Beans. °? Fresh fruits and  vegetables. °Activity °· Return to your normal activities as told by your health care provider. Ask your health care provider what activities are safe for you. °· Rest as much as possible. Try to rest or take a nap when your baby is sleeping. °· Do not lift anything that is heavier than your baby or 10 lb (4.5 kg) until your health care provider says that it is safe. °· Talk with your health care provider about when you can engage in sexual activity. This may depend on your: °? Risk of infection. °? Rate of healing. °? Comfort and desire to engage in sexual activity. °Vaginal Care °· If you have an episiotomy or a vaginal tear, check the area every day for signs of infection. Check for: °? More redness, swelling, or pain. °? More fluid or blood. °? Warmth. °? Pus or a bad smell. °· Do not use tampons or douches until your health care provider says this is safe. °· Watch for any blood clots that may pass from your vagina. These may look like clumps of dark red, brown, or black discharge. °General instructions °· Keep your perineum clean and dry as told by your health care provider. °· Wear loose, comfortable clothing. °· Wipe from front to back when you use the toilet. °· Ask your health care provider if you can shower or take a bath. If you had an episiotomy or a perineal tear during labor and delivery, your health care provider may tell you not to take baths for a certain length of time. °· Wear a bra that supports your breasts and fits you well. °· If possible, have someone help you with household activities and help care for your baby for at least a few days after you leave the hospital. °· Keep all follow-up visits for you and your baby as told by your health care provider. This is important. °Contact a health care provider   if: °· You have: °? Vaginal discharge that has a bad smell. °? Difficulty urinating. °? Pain when urinating. °? A sudden increase or decrease in the frequency of your bowel movements. °? More  redness, swelling, or pain around your episiotomy or vaginal tear. °? More fluid or blood coming from your episiotomy or vaginal tear. °? Pus or a bad smell coming from your episiotomy or vaginal tear. °? A fever. °? A rash. °? Little or no interest in activities you used to enjoy. °? Questions about caring for yourself or your baby. °· Your episiotomy or vaginal tear feels warm to the touch. °· Your episiotomy or vaginal tear is separating or does not appear to be healing. °· Your breasts are painful, hard, or turn red. °· You feel unusually sad or worried. °· You feel nauseous or you vomit. °· You pass large blood clots from your vagina. If you pass a blood clot from your vagina, save it to show to your health care provider. Do not flush blood clots down the toilet without having your health care provider look at them. °· You urinate more than usual. °· You are dizzy or light-headed. °· You have not breastfed at all and you have not had a menstrual period for 12 weeks after delivery. °· You have stopped breastfeeding and you have not had a menstrual period for 12 weeks after you stopped breastfeeding. °Get help right away if: °· You have: °? Pain that does not go away or does not get better with medicine. °? Chest pain. °? Difficulty breathing. °? Blurred vision or spots in your vision. °? Thoughts about hurting yourself or your baby. °· You develop pain in your abdomen or in one of your legs. °· You develop a severe headache. °· You faint. °· You bleed from your vagina so much that you fill two sanitary pads in one hour. °This information is not intended to replace advice given to you by your health care provider. Make sure you discuss any questions you have with your health care provider. °Document Released: 04/15/2000 Document Revised: 09/30/2015 Document Reviewed: 05/03/2015 °[employer] Interactive Patient Education © 2018 [employer] °Home Care Instructions for Mom °ACTIVITY °· Gradually return to your  regular activities. °· Let yourself rest. Nap while your baby sleeps. °· Avoid lifting anything that is heavier than 10 lb (4.5 kg) until your health care provider says it is okay. °· Avoid activities that take a lot of effort and energy (are strenuous) until approved by your health care provider. Walking at a slow-to-moderate pace is usually safe. °· If you had a cesarean delivery: °? Do not vacuum, climb stairs, or drive a car for 4-6 weeks. °? Have someone help you at home until you feel like you can do your usual activities yourself. °? Do exercises as told by your health care provider, if this applies. ° °VAGINAL BLEEDING °You may continue to bleed for 4-6 weeks after delivery. Over time, the amount of blood usually decreases and the color of the blood usually gets lighter. However, the flow of bright red blood may increase if you have been too active. If you need to use more than one pad in an hour because your pad gets soaked, or if you pass a large clot: °· Lie down. °· Raise your feet. °· Place a cold compress on your lower abdomen. °· Rest. °· Call your health care provider. ° °If you are breastfeeding, your period should return anytime between 8 weeks after   delivery and the time that you stop breastfeeding. If you are not breastfeeding, your period should return 6-8 weeks after delivery. PERINEAL CARE The perineal area, or perineum, is the part of your body between your thighs. After delivery, this area needs special care. Follow these instructions as told by your health care provider.  Take warm tub baths for 15-20 minutes.  Use medicated pads and pain-relieving sprays and creams as told.  Do not use tampons or douches until vaginal bleeding has stopped.  Each time you go to the bathroom: ? Use a peri bottle. ? Change your pad. ? Use towelettes in place of toilet paper until your stitches have healed.  Do Kegel exercises every day. Kegel exercises help to maintain the muscles that support  the vagina, bladder, and bowels. You can do these exercises while you are standing, sitting, or lying down. To do Kegel exercises: ? Tighten the muscles of your abdomen and the muscles that surround your birth canal. ? Hold for a few seconds. ? Relax. ? Repeat until you have done this 5 times in a row.  To prevent hemorrhoids from developing or getting worse: ? Drink enough fluid to keep your urine clear or pale yellow. ? Avoid straining when having a bowel movement. ? Take over-the-counter medicines and stool softeners as told by your health care provider.  BREAST CARE  Wear a tight-fitting bra.  Avoid taking over-the-counter pain medicine for breast discomfort.  Apply ice to the breasts to help with discomfort as needed: ? Put ice in a plastic bag. ? Place a towel between your skin and the bag. ? Leave the ice on for 20 minutes or as told by your health care provider.  NUTRITION  Eat a well-balanced diet.  Do not try to lose weight quickly by cutting back on calories.  Take your prenatal vitamins until your postpartum checkup or until your health care provider tells you to stop.  POSTPARTUM DEPRESSION You may find yourself crying for no apparent reason and unable to cope with all of the changes that come with having a newborn. This mood is called postpartum depression. Postpartum depression happens because your hormone levels change after delivery. If you have postpartum depression, get support from your partner, friends, and family. If the depression does not go away on its own after several weeks, contact your health care provider. BREAST SELF-EXAM Do a breast self-exam each month, at the same time of the month. If you are breastfeeding, check your breasts just after a feeding, when your breasts are less full. If you are breastfeeding and your period has started, check your breasts on day 5, 6, or 7 of your period. Report any lumps, bumps, or discharge to your health care  provider. Know that breasts are normally lumpy if you are breastfeeding. This is temporary, and it is not a health risk. INTIMACY AND SEXUALITY Avoid sexual activity for at least 3-4 weeks after delivery or until the brownish-red vaginal flow is completely gone. If you want to avoid pregnancy, use some form of birth control. You can get pregnant after delivery, even if you have not had your period. SEEK MEDICAL CARE IF:  You feel unable to cope with the changes that a child brings to your life, and these feelings do not go away after several weeks.  You notice a lump, a bump, or discharge on your breast.  SEEK IMMEDIATE MEDICAL CARE IF:  Blood soaks your pad in 1 hour or less.  You have: ?  Severe pain or cramping in your lower abdomen. ? A bad-smelling vaginal discharge. ? A fever that is not controlled by medicine. ? A fever, and an area of your breast is red and sore. ? Pain or redness in your calf. ? Sudden, severe chest pain. ? Shortness of breath. ? Painful or bloody urination. ? Problems with your vision.  You vomit for 12 hours or longer.  You develop a severe headache.  You have serious thoughts about hurting yourself, your child, or anyone else.  This information is not intended to replace advice given to you by your health care provider. Make sure you discuss any questions you have with your health care provider. Document Released: 04/15/2000 Document Revised: 09/24/2015 Document Reviewed: 10/20/2014 Elsevier Interactive Patient Education  2017 [employer] Postpartum Care After Vaginal Delivery The period of time right after you deliver your newborn is called the postpartum period. What kind of medical care will I receive?  You may continue to receive fluids and medicines through an IV tube inserted into one of your veins.  If an incision was made near your vagina (episiotomy) or if you had some vaginal tearing during delivery, cold compresses may be placed on your  episiotomy or your tear. This helps to reduce pain and swelling.  You may be given a squirt bottle to use when you go to the bathroom. You may use this until you are comfortable wiping as usual. To use the squirt bottle, follow these steps: ? Before you urinate, fill the squirt bottle with warm water. Do not use hot water. ? After you urinate, while you are sitting on the toilet, use the squirt bottle to rinse the area around your urethra and vaginal opening. This rinses away any urine and blood. ? You may do this instead of wiping. As you start healing, you may use the squirt bottle before wiping yourself. Make sure to wipe gently. ? Fill the squirt bottle with clean water every time you use the bathroom.  You will be given sanitary pads to wear. How can I expect to feel?  You may not feel the need to urinate for several hours after delivery.  You will have some soreness and pain in your abdomen and vagina.  If you are breastfeeding, you may have uterine contractions every time you breastfeed for up to several weeks postpartum. Uterine contractions help your uterus return to its normal size.  It is normal to have vaginal bleeding (lochia) after delivery. The amount and appearance of lochia is often similar to a menstrual period in the first week after delivery. It will gradually decrease over the next few weeks to a dry, yellow-brown discharge. For most women, lochia stops completely by 6-8 weeks after delivery. Vaginal bleeding can vary from woman to woman.  Within the first few days after delivery, you may have breast engorgement. This is when your breasts feel heavy, full, and uncomfortable. Your breasts may also throb and feel hard, tightly stretched, warm, and tender. After this occurs, you may have milk leaking from your breasts.Your health care provider can help you relieve discomfort due to breast engorgement. Breast engorgement should go away within a few days.  You may feel more sad  or worried than normal due to hormonal changes after delivery. These feelings should not last more than a few days. If these feelings do not go away after several days, speak with your health care provider. How should I care for myself?  Tell your health care  provider if you have pain or discomfort.  Drink enough water to keep your urine clear or pale yellow.  Wash your hands thoroughly with soap and water for at least 20 seconds after changing your sanitary pads, after using the toilet, and before holding or feeding your baby.  If you are not breastfeeding, avoid touching your breasts a lot. Doing this can make your breasts produce more milk.  If you become weak or lightheaded, or you feel like you might faint, ask for help before: ? Getting out of bed. ? Showering.  Change your sanitary pads frequently. Watch for any changes in your flow, such as a sudden increase in volume, a change in color, the passing of large blood clots. If you pass a blood clot from your vagina, save it to show to your health care provider. Do not flush blood clots down the toilet without having your health care provider look at them.  Make sure that all your vaccinations are up to date. This can help protect you and your baby from getting certain diseases. You may need to have immunizations done before you leave the hospital.  If desired, talk with your health care provider about methods of family planning or birth control (contraception). How can I start bonding with my baby? Spending as much time as possible with your baby is very important. During this time, you and your baby can get to know each other and develop a bond. Having your baby stay with you in your room (rooming in) can give you time to get to know your baby. Rooming in can also help you become comfortable caring for your baby. Breastfeeding can also help you bond with your baby. How can I plan for returning home with my baby?  Make sure that you have  a car seat installed in your vehicle. ? Your car seat should be checked by a certified car seat installer to make sure that it is installed safely. ? Make sure that your baby fits into the car seat safely.  Ask your health care provider any questions you have about caring for yourself or your baby. Make sure that you are able to contact your health care provider with any questions after leaving the hospital. This information is not intended to replace advice given to you by your health care provider. Make sure you discuss any questions you have with your health care provider. Document Released: 02/13/2007 Document Revised: 09/21/2015 Document Reviewed: 03/23/2015 Elsevier Interactive Patient Education  2018 [employer] Postpartum Depression and Baby Blues The postpartum period begins right after the birth of a baby. During this time, there is often a great amount of joy and excitement. It is also a time of many changes in the life of the parents. Regardless of how many times a mother gives birth, each child brings new challenges and dynamics to the family. It is not unusual to have feelings of excitement along with confusing shifts in moods, emotions, and thoughts. All mothers are at risk of developing postpartum depression or the "baby blues." These mood changes can occur right after giving birth, or they may occur many months after giving birth. The baby blues or postpartum depression can be mild or severe. Additionally, postpartum depression can go away rather quickly, or it can be a long-term condition. What are the causes? Raised hormone levels and the rapid drop in those levels are thought to be a main cause of postpartum depression and the baby blues. A number of hormones  change during and after pregnancy. Estrogen and progesterone usually decrease right after the delivery of your baby. The levels of thyroid hormone and various cortisol steroids also rapidly drop. Other factors that play a role  in these mood changes include major life events and genetics. What increases the risk? If you have any of the following risks for the baby blues or postpartum depression, know what symptoms to watch out for during the postpartum period. Risk factors that may increase the likelihood of getting the baby blues or postpartum depression include:  Having a personal or family history of depression.  Having depression while being pregnant.  Having premenstrual mood issues or mood issues related to oral contraceptives.  Having a lot of life stress.  Having marital conflict.  Lacking a social support network.  Having a baby with special needs.  Having health problems, such as diabetes.  What are the signs or symptoms? Symptoms of baby blues include:  Brief changes in mood, such as going from extreme happiness to sadness.  Decreased concentration.  Difficulty sleeping.  Crying spells, tearfulness.  Irritability.  Anxiety.  Symptoms of postpartum depression typically begin within the first month after giving birth. These symptoms include:  Difficulty sleeping or excessive sleepiness.  Marked weight loss.  Agitation.  Feelings of worthlessness.  Lack of interest in activity or food.  Postpartum psychosis is a very serious condition and can be dangerous. Fortunately, it is rare. Displaying any of the following symptoms is cause for immediate medical attention. Symptoms of postpartum psychosis include:  Hallucinations and delusions.  Bizarre or disorganized behavior.  Confusion or disorientation.  How is this diagnosed? A diagnosis is made by an evaluation of your symptoms. There are no medical or lab tests that lead to a diagnosis, but there are various questionnaires that a health care provider may use to identify those with the baby blues, postpartum depression, or psychosis. Often, a screening tool called the [place] Postnatal Depression Scale is used to diagnose  depression in the postpartum period. How is this treated? The baby blues usually goes away on its own in 1-2 weeks. Social support is often all that is needed. You will be encouraged to get adequate sleep and rest. Occasionally, you may be given medicines to help you sleep. Postpartum depression requires treatment because it can last several months or longer if it is not treated. Treatment may include individual or group therapy, medicine, or both to address any social, physiological, and psychological factors that may play a role in the depression. Regular exercise, a healthy diet, rest, and social support may also be strongly recommended. Postpartum psychosis is more serious and needs treatment right away. Hospitalization is often needed. Follow these instructions at home:  Get as much rest as you can. Nap when the baby sleeps.  Exercise regularly. Some women find yoga and walking to be beneficial.  Eat a balanced and nourishing diet.  Do little things that you enjoy. Have a cup of tea, take a bubble bath, read your favorite magazine, or listen to your favorite music.  Avoid alcohol.  Ask for help with household chores, cooking, grocery shopping, or running errands as needed. Do not try to do everything.  Talk to people close to you about how you are feeling. Get support from your partner, family members, friends, or other new moms.  Try to stay positive in how you think. Think about the things you are grateful for.  Do not spend a lot of time alone.  Only  take over-the-counter or prescription medicine as directed by your health care provider.  Keep all your postpartum appointments.  Let your health care provider know if you have any concerns. Contact a health care provider if: You are having a reaction to or problems with your medicine. Get help right away if:  You have suicidal feelings.  You think you may harm the baby or someone else. This information is not intended to  replace advice given to you by your health care provider. Make sure you discuss any questions you have with your health care provider. Document Released: 01/21/2004 Document Revised: 09/24/2015 Document Reviewed: 01/28/2013 Elsevier Interactive Patient Education  2017 [employer].

## 2018-03-21 ENCOUNTER — Encounter: Payer: 59 | Admitting: Certified Nurse Midwife

## 2018-04-03 ENCOUNTER — Telehealth: Payer: Self-pay | Admitting: Certified Nurse Midwife

## 2018-04-03 NOTE — Telephone Encounter (Signed)
The patient called to check the status of her FMLA paperwork. The patient stated that [employer] has informed the patient that they have not received any completed forms on the patient as of now. The patient is requesting a call back for clarification, No other information was disclosed. Please advise.

## 2018-04-10 ENCOUNTER — Other Ambulatory Visit: Payer: 59

## 2018-04-10 ENCOUNTER — Telehealth: Payer: Self-pay | Admitting: Obstetrics and Gynecology

## 2018-04-10 ENCOUNTER — Other Ambulatory Visit: Payer: Self-pay | Admitting: *Deleted

## 2018-04-10 DIAGNOSIS — R300 Dysuria: Secondary | ICD-10-CM

## 2018-04-10 MED ORDER — NITROFURANTOIN MONOHYD MACRO 100 MG PO CAPS: 100.0000 mg | ORAL_CAPSULE | Freq: Two times a day (BID) | ORAL | 0 refills | Status: DC

## 2018-04-10 NOTE — Telephone Encounter (Signed)
The patient is having frequency and dysuria; she is asking if she can come in today and do a drop off?  Please advise, thanks.

## 2018-04-10 NOTE — Telephone Encounter (Signed)
Hey yes that is fine

## 2018-04-12 DIAGNOSIS — R300 Dysuria: Secondary | ICD-10-CM | POA: Diagnosis not present

## 2018-04-15 LAB — URINE CULTURE

## 2018-04-16 ENCOUNTER — Other Ambulatory Visit: Payer: Self-pay | Admitting: Obstetrics and Gynecology

## 2018-04-16 MED ORDER — LEVOFLOXACIN 500 MG PO TABS: 500.0000 mg | ORAL_TABLET | Freq: Every day | ORAL | 0 refills | Status: DC

## 2018-04-18 NOTE — Telephone Encounter (Signed)
[facility].

## 2018-04-20 ENCOUNTER — Encounter: Payer: Self-pay | Admitting: Certified Nurse Midwife

## 2018-04-20 ENCOUNTER — Ambulatory Visit (INDEPENDENT_AMBULATORY_CARE_PROVIDER_SITE_OTHER): Payer: 59 | Admitting: Certified Nurse Midwife

## 2018-04-20 DIAGNOSIS — Z1389 Encounter for screening for other disorder: Secondary | ICD-10-CM | POA: Diagnosis not present

## 2018-04-20 DIAGNOSIS — Z87440 Personal history of urinary (tract) infections: Secondary | ICD-10-CM

## 2018-04-20 NOTE — Patient Instructions (Signed)
Preventive Care 18-39 Years, Female Preventive care refers to lifestyle choices and visits with your health care provider that can promote health and wellness. What does preventive care include?   A yearly physical exam. This is also called an annual well check.  Dental exams once or twice a year.  Routine eye exams. Ask your health care provider how often you should have your eyes checked.  Personal lifestyle choices, including: ? Daily care of your teeth and gums. ? Regular physical activity. ? Eating a healthy diet. ? Avoiding tobacco and drug use. ? Limiting alcohol use. ? Practicing safe sex. ? Taking vitamin and mineral supplements as recommended by your health care provider. What happens during an annual well check? The services and screenings done by your health care provider during your annual well check will depend on your age, overall health, lifestyle risk factors, and family history of disease. Counseling Your health care provider may ask you questions about your:  Alcohol use.  Tobacco use.  Drug use.  Emotional well-being.  Home and relationship well-being.  Sexual activity.  Eating habits.  Work and work environment.  Method of birth control.  Menstrual cycle.  Pregnancy history. Screening You may have the following tests or measurements:  Height, weight, and BMI.  Diabetes screening. This is done by checking your blood sugar (glucose) after you have not eaten for a while (fasting).  Blood pressure.  Lipid and cholesterol levels. These may be checked every 5 years starting at age 20.  Skin check.  Hepatitis C blood test.  Hepatitis B blood test.  Sexually transmitted disease (STD) testing.  BRCA-related cancer screening. This may be done if you have a family history of breast, ovarian, tubal, or peritoneal cancers.  Pelvic exam and Pap test. This may be done every 3 years starting at age 21. Starting at age 30, this may be done every 5  years if you have a Pap test in combination with an HPV test. Discuss your test results, treatment options, and if necessary, the need for more tests with your health care provider. Vaccines Your health care provider may recommend certain vaccines, such as:  Influenza vaccine. This is recommended every year.  Tetanus, diphtheria, and acellular pertussis (Tdap, Td) vaccine. You may need a Td booster every 10 years.  Varicella vaccine. You may need this if you have not been vaccinated.  HPV vaccine. If you are 26 or younger, you may need three doses over 6 months.  Measles, mumps, and rubella (MMR) vaccine. You may need at least one dose of MMR. You may also need a second dose.  Pneumococcal 13-valent conjugate (PCV13) vaccine. You may need this if you have certain conditions and were not previously vaccinated.  Pneumococcal polysaccharide (PPSV23) vaccine. You may need one or two doses if you smoke cigarettes or if you have certain conditions.  Meningococcal vaccine. One dose is recommended if you are age 19-21 years and a first-year college student living in a residence hall, or if you have one of several medical conditions. You may also need additional booster doses.  Hepatitis A vaccine. You may need this if you have certain conditions or if you travel or work in places where you may be exposed to hepatitis A.  Hepatitis B vaccine. You may need this if you have certain conditions or if you travel or work in places where you may be exposed to hepatitis B.  Haemophilus influenzae type b (Hib) vaccine. You may need this if you   have certain risk factors. Talk to your health care provider about which screenings and vaccines you need and how often you need them. This information is not intended to replace advice given to you by your health care provider. Make sure you discuss any questions you have with your health care provider. Document Released: 06/14/2001 Document Revised: 11/29/2016  Document Reviewed: 02/17/2015 Elsevier Interactive Patient Education  2019 [employer].

## 2018-04-20 NOTE — Progress Notes (Signed)
Subjective:    Paige Brown is a 32 y.o. [id number] Caucasian female who presents for a postpartum visit. She is 5 weeks postpartum following a vaginal birth after cesarean (VBAC) at 6338 gestational weeks. Anesthesia: none. I have fully reviewed the prenatal and intrapartum course.   Postpartum course has been complicated by UTI, requests referral to Urology for management of frequent UTIs. Baby's course has been uncomplicated. Baby is feeding by breast. Bleeding no bleeding. Bowel function is normal. Bladder function is normal. Patient is not sexually active. Contraception method is condoms until vasectomy. Postpartum depression screening: negative. Score 2.  Last pap 2016 and was normal.  Denies difficulty breathing or respiratory distress, chest pain, abdominal pain, excessive vaginal bleeding, dysuria, and leg pain or swelling.   The following portions of the patient's history were reviewed and updated as appropriate: allergies, current medications, past medical history, past surgical history and problem list.  Review of Systems  Pertinent items are noted in HPI.   Objective:   BP 128/72   Pulse 91   Ht 5\' 6"  (1.676 m)   Wt 174 lb 12.8 oz (79.3 kg)   Breastfeeding Yes   BMI 28.21 kg/m   General:  alert, cooperative and no distress   Breasts:  deferred, no complaints  Lungs: clear to auscultation bilaterally  Heart:  regular rate and rhythm  Abdomen: soft, nontender   Vulva: normal  Vagina: normal vagina  Cervix:  closed  Corpus: Well-involuted  Adnexa:  Non-palpable  Rectal Exam:  Internal hemorrhoids present      Depression screen [facility]/9 04/20/2018 07/20/2016  Decreased Interest 0 0  Down, Depressed, Hopeless 0 0  PHQ - 2 Score 0 0  Altered sleeping 0 0  Tired, decreased energy 1 0  Change in appetite 1 0  Feeling bad or failure about yourself  0 0  Trouble concentrating 0 0  Moving slowly or fidgety/restless 0 0  Suicidal thoughts 0 0  PHQ-9 Score 2 0  Difficult  doing work/chores Somewhat difficult -     Assessment:   Postpartum exam Five (5) wks s/p successful vaginal birth after cesarean section Breastfeeding Depression screening Contraception counseling   Plan:   Declines labs today. Taking vitamin D and iron supplements.   May return to work without restriction.   Encouraged routine health maintenance.   Referral to Urology, see orders.   Reviewed red flag symptoms and when to call.   Follow up in: 5 months for ANNUAL EXAM AND PAP or earlier if needed.   Gunnar BullaJenkins Michelle Oziah Vitanza, CNM [facility] 04/20/18 10:17 AM

## 2018-04-20 NOTE — Progress Notes (Signed)
Patient here for 6 week post-partum visit.  Patient c/o recurrent UTIs, no symptoms currently but will like to see urologist.

## 2018-05-07 ENCOUNTER — Encounter: Payer: Self-pay | Admitting: Certified Nurse Midwife

## 2018-05-07 ENCOUNTER — Ambulatory Visit
Admission: EM | Admit: 2018-05-07 | Discharge: 2018-05-07 | Disposition: A | Discharge status: Home / Self Care | Payer: 59 | Attending: Family Medicine | Admitting: Family Medicine

## 2018-05-07 ENCOUNTER — Encounter: Payer: Self-pay | Admitting: Emergency Medicine

## 2018-05-07 ENCOUNTER — Other Ambulatory Visit: Payer: Self-pay

## 2018-05-07 DIAGNOSIS — Z87440 Personal history of urinary (tract) infections: Secondary | ICD-10-CM | POA: Diagnosis not present

## 2018-05-07 DIAGNOSIS — N3001 Acute cystitis with hematuria: Secondary | ICD-10-CM | POA: Insufficient documentation

## 2018-05-07 LAB — URINALYSIS, COMPLETE (UACMP) WITH MICROSCOPIC
Bilirubin Urine: NEGATIVE
GLUCOSE, UA: NEGATIVE mg/dL
Ketones, ur: 40 mg/dL — AB
Nitrite: NEGATIVE
PH: 6 (ref 5.0–8.0)
Protein, ur: 100 mg/dL — AB
RBC / HPF: >50 RBC/hpf (ref 0–5)
Specific Gravity, Urine: 1.025 (ref 1.005–1.030)
Squamous Epithelial / LPF: NONE SEEN (ref 0–5)
WBC, UA: >50 WBC/hpf (ref 0–5)

## 2018-05-07 MED ORDER — CEFDINIR 300 MG PO CAPS: 300.0000 mg | ORAL_CAPSULE | Freq: Two times a day (BID) | ORAL | 0 refills | Status: AC

## 2018-05-07 NOTE — ED Triage Notes (Signed)
Patient has appointment with Urology next month.

## 2018-05-07 NOTE — ED Provider Notes (Signed)
[facility]   CSN: [id number] Arrival date & time: 05/07/18  0856  History   Chief Complaint Chief Complaint  Patient presents with  . Dysuria  . Urinary Urgency  . Urinary Frequency   HPI  33 year old female with a history of recurrent UTI presents with concerns for UTI.  Patient reports a recent UTI approximately 3 weeks ago.  Was treated with Macrobid.  This had to be switched due to resistance.  She was prescribed Levaquin but did not tolerate due to nausea and vomiting.  Patient reports that she developed recurrent symptoms this morning.  She reports dysuria, back pain, frequency, and urgency.  No fever.  No chills.  Reports nausea vomiting.  No known exacerbating relieving factors.  No other associated symptoms.  Of note, patient states that she did take 1 dose of Macrobid this morning.  PMH, Surgical Hx, Family Hx, Social History reviewed and updated as below.  Past Medical History:  Diagnosis Date  . Anemia    with pregnancies  . GERD (gastroesophageal reflux disease)    during pregnancy  . UTI (lower urinary tract infection) 11/09/2015   2 more days to complete ATB   Patient Active Problem List   Diagnosis Date Noted  . History of recurrent UTIs 04/20/2018  . Vaginal birth after cesarean (VBAC) 06/11/2016  . Vitamin D deficiency 04/12/2016  . Temporary low platelet count ([place]) 04/12/2016  . History of vaginal delivery following previous cesarean delivery 01/05/2016   Past Surgical History:  Procedure Laterality Date  . CESAREAN SECTION    . TONSILLECTOMY  1994   and adnoids  . WISDOM TOOTH EXTRACTION     2000   OB History    Gravida  4   Para  4   Term  4   Preterm      AB      Living  4     SAB      TAB      Ectopic      Multiple  0   Live Births  4        Obstetric Comments  1st pregnancy-breech 2nd pregnancy: PTL at 35 wks./ VBAC- no assistance        Home Medications    Prior to Admission medications     Medication Sig Start Date End Date Taking? Authorizing Provider  cefdinir (OMNICEF) 300 MG capsule Take 1 capsule (300 mg total) by mouth 2 (two) times daily for 7 days. 05/07/18 05/14/18  Coral Spikes, DO   Family History Family History  Problem Relation Age of Onset  . Cancer Father        multiple Myeloma  . Rheum arthritis Maternal Grandmother   . Osteoarthritis Maternal Grandmother   . Breast cancer Maternal Grandmother 60  . Thyroid disease Maternal Grandmother   . Heart failure Maternal Grandfather   . Hyperlipidemia Maternal Grandfather   . Hypertension Maternal Grandfather   . Asthma Paternal Grandmother   . Stroke Paternal Grandfather   . Rheum arthritis Maternal Aunt   . Rheum arthritis Maternal Uncle   . Ovarian cancer Neg Hx   . Colon cancer Neg Hx    Social History Social History   Tobacco Use  . Smoking status: Never Smoker  . Smokeless tobacco: Never Used  Substance Use Topics  . Alcohol use: Not Currently    Alcohol/week: 0.0 standard drinks  . Drug use: No   Allergies   Ciprofloxacin; Penicillins; Shellfish allergy; Cefaclor; and Levaquin [  levofloxacin]   Review of Systems Review of Systems  Constitutional: Negative for fever.  Genitourinary: Positive for dysuria, frequency and urgency.  Musculoskeletal: Positive for back pain.   Physical Exam Triage Vital Signs ED Triage Vitals  Enc Vitals Group     BP 05/07/18 0926 138/85     Pulse Rate 05/07/18 0926 86     Resp 05/07/18 0926 18     Temp 05/07/18 0926 98.1 F (36.7 C)     Temp Source 05/07/18 0926 Oral     SpO2 05/07/18 0926 100 %     Weight 05/07/18 0924 172 lb (78 kg)     Height 05/07/18 0924 '5\' 6"'  (1.676 m)     Head Circumference --      Peak Flow --      Pain Score 05/07/18 0923 8     Pain Loc --      Pain Edu? --      Excl. in [place]? --    Updated Vital Signs BP 138/85 (BP Location: Left Arm)   Pulse 86   Temp 98.1 F (36.7 C) (Oral)   Resp 18   Ht '5\' 6"'  (1.676 m)   Wt 78 kg    SpO2 100%   Breastfeeding Yes   BMI 27.76 kg/m   Visual Acuity Right Eye Distance:   Left Eye Distance:   Bilateral Distance:    Right Eye Near:   Left Eye Near:    Bilateral Near:     Physical Exam Vitals signs and nursing note reviewed.  Constitutional:      General: She is not in acute distress. HENT:     Head: Normocephalic and atraumatic.     Mouth/Throat:     Pharynx: Oropharynx is clear.  Eyes:     General: No scleral icterus.    Conjunctiva/sclera: Conjunctivae normal.  Cardiovascular:     Rate and Rhythm: Normal rate and regular rhythm.  Pulmonary:     Effort: Pulmonary effort is normal.     Breath sounds: No wheezing, rhonchi or rales.  Abdominal:     General: There is no distension.     Palpations: Abdomen is soft.     Tenderness: There is no abdominal tenderness.  Neurological:     Mental Status: She is alert.  Psychiatric:        Mood and Affect: Mood normal.        Behavior: Behavior normal.    UC Treatments / Results  Labs (all labs ordered are listed, but only abnormal results are displayed) Labs Reviewed  URINALYSIS, COMPLETE (UACMP) WITH MICROSCOPIC - Abnormal; Notable for the following components:      Result Value   APPearance CLOUDY (*)    Hgb urine dipstick MODERATE (*)    Ketones, ur 40 (*)    Protein, ur 100 (*)    Leukocytes, UA LARGE (*)    Bacteria, UA FEW (*)    All other components within normal limits  URINE CULTURE    EKG None  Radiology No results found.  Procedures Procedures (including critical care time)  Medications Ordered in UC Medications - No data to display  Initial Impression / Assessment and Plan / UC Course  I have reviewed the triage vital signs and the nursing notes.  Pertinent labs & imaging results that were available during my care of the patient were reviewed by me and considered in my medical decision making (see chart for details).    33 year old female with recurrent UTI  presents with  UTI.  Sending culture.  Placing on Omnicef.  We had a lengthy discussion about treatment options and we elected to proceed with Omnicef.  Final Clinical Impressions(s) / UC Diagnoses   Final diagnoses:  Acute cystitis with hematuria   Discharge Instructions   None    ED Prescriptions    Medication Sig Dispense Auth. Provider   cefdinir (OMNICEF) 300 MG capsule Take 1 capsule (300 mg total) by mouth 2 (two) times daily for 7 days. 14 capsule Coral Spikes, DO     Controlled Substance Prescriptions [place] Controlled Substance Registry consulted? Not Applicable   Coral Spikes, DO 05/07/18 1016

## 2018-05-07 NOTE — ED Triage Notes (Signed)
Patient c/o dysuria, hematuria, urinary frequency and urgency that started this morning. Patient c/o UTI symptoms 3 weeks ago, was placed on Macrobid then changed to Levaquin for 2 days. She states she did take 1 Macrobid this morning when she woke up.

## 2018-05-08 LAB — URINE CULTURE: CULTURE: NO GROWTH

## 2018-05-11 ENCOUNTER — Telehealth: Payer: Self-pay | Admitting: Obstetrics and Gynecology

## 2018-05-11 NOTE — Telephone Encounter (Signed)
The patient called and stated that she needs a return to work letter. The patient is planning to return to work next Wednesday  05/16/18. No other information was disclosed. Please advise.

## 2018-06-05 ENCOUNTER — Encounter: Payer: Self-pay | Admitting: Urology

## 2018-06-05 ENCOUNTER — Ambulatory Visit (INDEPENDENT_AMBULATORY_CARE_PROVIDER_SITE_OTHER): Payer: 59 | Admitting: Urology

## 2018-06-05 VITALS — BP 137/89 | HR 112 | Ht 66.0 in | Wt 165.1 lb

## 2018-06-05 DIAGNOSIS — Z87440 Personal history of urinary (tract) infections: Secondary | ICD-10-CM

## 2018-06-05 LAB — URINALYSIS, COMPLETE
BILIRUBIN UA: NEGATIVE
Glucose, UA: NEGATIVE
LEUKOCYTES UA: NEGATIVE
Nitrite, UA: NEGATIVE
PH UA: 6 (ref 5.0–7.5)
PROTEIN UA: NEGATIVE
RBC UA: NEGATIVE
Specific Gravity, UA: 1.025 (ref 1.005–1.030)
Urobilinogen, Ur: 0.2 mg/dL (ref 0.2–1.0)

## 2018-06-05 LAB — MICROSCOPIC EXAMINATION
BACTERIA UA: NONE SEEN
RBC, UA: NONE SEEN /hpf (ref 0–2)
WBC, UA: NONE SEEN /hpf (ref 0–5)

## 2018-06-05 MED ORDER — SULFAMETHOXAZOLE-TRIMETHOPRIM 800-160 MG PO TABS: 1.0000 | ORAL_TABLET | ORAL | 11 refills | Status: DC | PRN

## 2018-06-05 NOTE — Progress Notes (Signed)
06/05/2018 10:54 AM   Paige Brown Apr 26, 1986 [id number]  Referring provider: Diona Fanti, CNM [place] [place], [place] [address]  CC: Recurrent UTI  HPI: I saw Ms. Uhorchuck in urology clinic today in consultation for recurrent UTIs from Dr. Verdene Rio.  She is a 33 year old very healthy female that does not take any medications and works as a Marine scientist in the ED who is had problems with recurrent urinary tract infections.  She reports she has had around 12 UTIs over the last 2 years.  These start very suddenly with severe dysuria, urgency, hematuria, and pelvic pain with pressure.  She reports these have worsened after her last 2 pregnancies.  She also has noticed that many of her UTIs have developed post-coitally.  She reports she has a strong urinary stream and is completely asymptomatic between these episodes of UTI.  Review of the chart demonstrates 3 culture documented UTIs over the last 2 years, 2 with E. Coli and a group B strep.  She does occasionally have flank pain with her urinary tract infections, but the side can vary.  She denies a history of urolithiasis.  She has not had any gross hematuria not affiliated with urinary tract infection.  There are no aggravating or alleviating factors.  Severity is moderate.  She denies any urinary symptoms currently   PMH: Past Medical History:  Diagnosis Date  . Anemia    with pregnancies  . GERD (gastroesophageal reflux disease)    during pregnancy  . UTI (lower urinary tract infection) 11/09/2015   2 more days to complete ATB    Surgical History: Past Surgical History:  Procedure Laterality Date  . CESAREAN SECTION    . TONSILLECTOMY  1994   and adnoids  . WISDOM TOOTH EXTRACTION     2000    Allergies:  Allergies  Allergen Reactions  . Ciprofloxacin Nausea And Vomiting  . Penicillins Hives  . Shellfish Allergy Swelling  . Cefaclor Hives  . Levaquin [Levofloxacin]     Family  History: Family History  Problem Relation Age of Onset  . Cancer Father        multiple Myeloma  . Rheum arthritis Maternal Grandmother   . Osteoarthritis Maternal Grandmother   . Breast cancer Maternal Grandmother 60  . Thyroid disease Maternal Grandmother   . Heart failure Maternal Grandfather   . Hyperlipidemia Maternal Grandfather   . Hypertension Maternal Grandfather   . Asthma Paternal Grandmother   . Stroke Paternal Grandfather   . Rheum arthritis Maternal Aunt   . Rheum arthritis Maternal Uncle   . Ovarian cancer Neg Hx   . Colon cancer Neg Hx     Social History:  reports that she has never smoked. She has never used smokeless tobacco. She reports previous alcohol use. She reports that she does not use drugs.  ROS: Please see flowsheet from today's date for complete review of systems.  Physical Exam: BP 137/89 (BP Location: Left Arm, Patient Position: Sitting, Cuff Size: Normal)   Pulse (!) 112   Ht '5\' 6"'  (1.676 m)   Wt 165 lb 1.6 oz (74.9 kg)   BMI 26.65 kg/m    Constitutional:  Alert and oriented, No acute distress. Cardiovascular: No clubbing, cyanosis, or edema. Respiratory: Normal respiratory effort, no increased work of breathing. GI: Abdomen is soft, nontender, nondistended, no abdominal masses GU: No CVA tenderness Lymph: No cervical or inguinal lymphadenopathy. Skin: No rashes, bruises or suspicious lesions. Neurologic: Grossly intact, no focal  deficits, moving all 4 extremities. Psychiatric: Normal mood and affect.  Pertinent Imaging: None to review  Assessment & Plan:   In summary, the patient is a 33 year old healthy female with 4 children who reports over 10 UTIs over the last 2 years with significant dysuria, urgency, frequency, and pelvic pain and pressure.  Some of these UTIs have developed post-coitally.  We discussed the evaluation and treatment of patients with recurrent UTIs at length.  We specifically discussed the differences between  asymptomatic bacteriuria and true urinary tract infection.  We discussed the AUA definition of recurrent UTI of at least 2 culture proven symptomatic acute cystitis episodes in a 48-monthperiod, or 3 within a 1 year period.  We discussed the importance of culture directed antibiotic treatment, and antibiotic stewardship.  First-line therapy includes nitrofurantoin(5 days), Bactrim(3 days), or fosfomycin(3 g single dose).  Possible etiologies of recurrent infection include periurethral tissue atrophy in postmenopausal woman, constipation, sexual activity, incomplete emptying, anatomic abnormalities, and even genetic predisposition.  Finally, we discussed the role of perineal hygiene, timed voiding, adequate hydration, topical vaginal estrogen, cranberry prophylaxis, and low-dose antibiotic prophylaxis.  Start cranberry prophylaxis and post-coital Bactrim prophylaxis RTC 6 to 8 weeks for symptom check  BBilley Co MD  [address] [place] [phone]

## 2018-07-31 ENCOUNTER — Telehealth: Payer: 59 | Admitting: Urology

## 2018-09-20 ENCOUNTER — Encounter: Payer: 59 | Admitting: Obstetrics and Gynecology

## 2018-10-24 DIAGNOSIS — M461 Sacroiliitis, not elsewhere classified: Secondary | ICD-10-CM | POA: Diagnosis not present

## 2018-10-24 DIAGNOSIS — M9903 Segmental and somatic dysfunction of lumbar region: Secondary | ICD-10-CM | POA: Diagnosis not present

## 2018-10-24 DIAGNOSIS — M955 Acquired deformity of pelvis: Secondary | ICD-10-CM | POA: Diagnosis not present

## 2018-10-24 DIAGNOSIS — M9905 Segmental and somatic dysfunction of pelvic region: Secondary | ICD-10-CM | POA: Diagnosis not present

## 2018-10-24 DIAGNOSIS — M530 Cervicocranial syndrome: Secondary | ICD-10-CM | POA: Diagnosis not present

## 2018-10-24 DIAGNOSIS — M546 Pain in thoracic spine: Secondary | ICD-10-CM | POA: Diagnosis not present

## 2018-10-24 DIAGNOSIS — M9902 Segmental and somatic dysfunction of thoracic region: Secondary | ICD-10-CM | POA: Diagnosis not present

## 2018-10-24 DIAGNOSIS — M5137 Other intervertebral disc degeneration, lumbosacral region: Secondary | ICD-10-CM | POA: Diagnosis not present

## 2018-10-24 DIAGNOSIS — M9901 Segmental and somatic dysfunction of cervical region: Secondary | ICD-10-CM | POA: Diagnosis not present

## 2018-10-31 DIAGNOSIS — M9903 Segmental and somatic dysfunction of lumbar region: Secondary | ICD-10-CM | POA: Diagnosis not present

## 2018-10-31 DIAGNOSIS — M546 Pain in thoracic spine: Secondary | ICD-10-CM | POA: Diagnosis not present

## 2018-10-31 DIAGNOSIS — M9901 Segmental and somatic dysfunction of cervical region: Secondary | ICD-10-CM | POA: Diagnosis not present

## 2018-10-31 DIAGNOSIS — M5137 Other intervertebral disc degeneration, lumbosacral region: Secondary | ICD-10-CM | POA: Diagnosis not present

## 2018-10-31 DIAGNOSIS — M5136 Other intervertebral disc degeneration, lumbar region: Secondary | ICD-10-CM | POA: Diagnosis not present

## 2018-10-31 DIAGNOSIS — M531 Cervicobrachial syndrome: Secondary | ICD-10-CM | POA: Diagnosis not present

## 2018-10-31 DIAGNOSIS — M9905 Segmental and somatic dysfunction of pelvic region: Secondary | ICD-10-CM | POA: Diagnosis not present

## 2018-10-31 DIAGNOSIS — M9902 Segmental and somatic dysfunction of thoracic region: Secondary | ICD-10-CM | POA: Diagnosis not present

## 2018-10-31 DIAGNOSIS — M955 Acquired deformity of pelvis: Secondary | ICD-10-CM | POA: Diagnosis not present

## 2018-11-16 ENCOUNTER — Encounter: Payer: Self-pay | Admitting: Obstetrics and Gynecology

## 2018-11-16 ENCOUNTER — Other Ambulatory Visit (HOSPITAL_COMMUNITY)
Admission: RE | Admit: 2018-11-16 | Discharge: 2018-11-16 | Disposition: A | Discharge status: Home / Self Care | Payer: 59 | Source: Ambulatory Visit | Attending: Obstetrics and Gynecology | Admitting: Obstetrics and Gynecology

## 2018-11-16 ENCOUNTER — Ambulatory Visit (INDEPENDENT_AMBULATORY_CARE_PROVIDER_SITE_OTHER): Payer: 59 | Admitting: Obstetrics and Gynecology

## 2018-11-16 ENCOUNTER — Other Ambulatory Visit: Payer: Self-pay

## 2018-11-16 VITALS — BP 121/81 | HR 95 | Ht 66.0 in | Wt 167.4 lb

## 2018-11-16 DIAGNOSIS — Z01419 Encounter for gynecological examination (general) (routine) without abnormal findings: Secondary | ICD-10-CM | POA: Diagnosis not present

## 2018-11-16 MED ORDER — CAYA VA DPRH: 1.0000 | VAGINAL_INSERT | Freq: Once | VAGINAL | 0 refills | Status: AC

## 2018-11-16 NOTE — Patient Instructions (Signed)
 Preventive Care 21-32 Years Old, Female Preventive care refers to visits with your health care provider and lifestyle choices that can promote health and wellness. This includes:  A yearly physical exam. This may also be called an annual well check.  Regular dental visits and eye exams.  Immunizations.  Screening for certain conditions.  Healthy lifestyle choices, such as eating a healthy diet, getting regular exercise, not using drugs or products that contain nicotine and tobacco, and limiting alcohol use. What can I expect for my preventive care visit? Physical exam Your health care provider will check your:  Height and weight. This may be used to calculate body mass index (BMI), which tells if you are at a healthy weight.  Heart rate and blood pressure.  Skin for abnormal spots. Counseling Your health care provider may ask you questions about your:  Alcohol, tobacco, and drug use.  Emotional well-being.  Home and relationship well-being.  Sexual activity.  Eating habits.  Work and work environment.  Method of birth control.  Menstrual cycle.  Pregnancy history. What immunizations do I need?  Influenza (flu) vaccine  This is recommended every year. Tetanus, diphtheria, and pertussis (Tdap) vaccine  You may need a Td booster every 10 years. Varicella (chickenpox) vaccine  You may need this if you have not been vaccinated. Human papillomavirus (HPV) vaccine  If recommended by your health care provider, you may need three doses over 6 months. Measles, mumps, and rubella (MMR) vaccine  You may need at least one dose of MMR. You may also need a second dose. Meningococcal conjugate (MenACWY) vaccine  One dose is recommended if you are age 19-21 years and a first-year college student living in a residence hall, or if you have one of several medical conditions. You may also need additional booster doses. Pneumococcal conjugate (PCV13) vaccine  You may need  this if you have certain conditions and were not previously vaccinated. Pneumococcal polysaccharide (PPSV23) vaccine  You may need one or two doses if you smoke cigarettes or if you have certain conditions. Hepatitis A vaccine  You may need this if you have certain conditions or if you travel or work in places where you may be exposed to hepatitis A. Hepatitis B vaccine  You may need this if you have certain conditions or if you travel or work in places where you may be exposed to hepatitis B. Haemophilus influenzae type b (Hib) vaccine  You may need this if you have certain conditions. You may receive vaccines as individual doses or as more than one vaccine together in one shot (combination vaccines). Talk with your health care provider about the risks and benefits of combination vaccines. What tests do I need?  Blood tests  Lipid and cholesterol levels. These may be checked every 5 years starting at age 20.  Hepatitis C test.  Hepatitis B test. Screening  Diabetes screening. This is done by checking your blood sugar (glucose) after you have not eaten for a while (fasting).  Sexually transmitted disease (STD) testing.  BRCA-related cancer screening. This may be done if you have a family history of breast, ovarian, tubal, or peritoneal cancers.  Pelvic exam and Pap test. This may be done every 3 years starting at age 21. Starting at age 30, this may be done every 5 years if you have a Pap test in combination with an HPV test. Talk with your health care provider about your test results, treatment options, and if necessary, the need for more   tests. Follow these instructions at home: Eating and drinking   Eat a diet that includes fresh fruits and vegetables, whole grains, lean protein, and low-fat dairy.  Take vitamin and mineral supplements as recommended by your health care provider.  Do not drink alcohol if: ? Your health care provider tells you not to drink. ? You are  pregnant, may be pregnant, or are planning to become pregnant.  If you drink alcohol: ? Limit how much you have to 0-1 drink a day. ? Be aware of how much alcohol is in your drink. In the [place] one drink equals one 12 oz bottle of beer (355 mL), one 5 oz glass of wine (148 mL), or one 1 oz glass of hard liquor (44 mL). Lifestyle  Take daily care of your teeth and gums.  Stay active. Exercise for at least 30 minutes on 5 or more days each week.  Do not use any products that contain nicotine or tobacco, such as cigarettes, e-cigarettes, and chewing tobacco. If you need help quitting, ask your health care provider.  If you are sexually active, practice safe sex. Use a condom or other form of birth control (contraception) in order to prevent pregnancy and STIs (sexually transmitted infections). If you plan to become pregnant, see your health care provider for a preconception visit. What's next?  Visit your health care provider once a year for a well check visit.  Ask your health care provider how often you should have your eyes and teeth checked.  Stay up to date on all vaccines. This information is not intended to replace advice given to you by your health care provider. Make sure you discuss any questions you have with your health care provider. Document Released: 06/14/2001 Document Revised: 12/28/2017 Document Reviewed: 12/28/2017 Elsevier Patient Education  2020 [employer].

## 2018-11-16 NOTE — Progress Notes (Signed)
Subjective:   Paige Brown is a 33 y.o. G45P4004 Caucasian female here for a routine well-woman exam.  No LMP recorded. (Menstrual status: Lactating).    Current complaints: none PCP: me       does desire labs  Social History: Sexual: heterosexual Marital Status: married Living situation: with family Occupation: Therapist, sports Tobacco/alcohol: no tobacco use Illicit drugs: no history of illicit drug use  The following portions of the patient's history were reviewed and updated as appropriate: allergies, current medications, past family history, past medical history, past social history, past surgical history and problem list.  Past Medical History Past Medical History:  Diagnosis Date  . Anemia    with pregnancies  . GERD (gastroesophageal reflux disease)    during pregnancy  . UTI (lower urinary tract infection) 11/09/2015   2 more days to complete ATB    Past Surgical History Past Surgical History:  Procedure Laterality Date  . CESAREAN SECTION    . TONSILLECTOMY  1994   and adnoids  . [place]    Gynecologic History [id number]  No LMP recorded. (Menstrual status: Lactating). Contraception: condoms Last Pap: 2015. Results were: normal   Obstetric History OB History  Gravida Para Term Preterm AB Living  4 4 4     4   SAB TAB Ectopic Multiple Live Births        0 4    # Outcome Date GA Lbr Len/2nd Weight Sex Delivery Anes PTL Lv  4 Term 03/16/18 [redacted]w[redacted]d 20:15 / 00:06 8 lb 0.8 oz (3.65 kg) M VBAC None  LIV  3 Term 06/11/16 [redacted]w[redacted]d / 00:41 6 lb 14 oz (3.118 kg) M Vag-Spont None  LIV  2 Term 09/11/14 [redacted]w[redacted]d  8 lb 5 oz (3.771 kg) M   Y LIV  1 Term 10/14/11 [redacted]w[redacted]d  7 lb 10 oz (3.459 kg) F CS-Unspec  N LIV    Obstetric Comments  1st pregnancy-breech  2nd pregnancy: PTL at 35 wks./ VBAC- no assistance    Current Medications Current Outpatient Medications on File Prior to Visit  Medication Sig Dispense Refill  . sulfamethoxazole-trimethoprim (BACTRIM  DS,SEPTRA DS) 800-160 MG tablet Take 1 tablet by mouth as needed. Take one tablet right before or right after sexual activity for prevention of UTI. 30 tablet 11   No current facility-administered medications on file prior to visit.     Review of Systems Patient denies any headaches, blurred vision, shortness of breath, chest pain, abdominal pain, problems with bowel movements, urination, or intercourse.  Objective:  BP 121/81   Pulse 95   Ht 5\' 6"  (1.676 m)   Wt 167 lb 6.4 oz (75.9 kg)   Breastfeeding Yes   BMI 27.02 kg/m  Physical Exam  General:  Well developed, well nourished, no acute distress. She is alert and oriented x3. Skin:  Warm and dry Neck:  Midline trachea, no thyromegaly or nodules Cardiovascular: Regular rate and rhythm, no murmur heard Lungs:  Effort normal, all lung fields clear to auscultation bilaterally Breasts:  No dominant palpable mass, retraction, or nipple discharge Abdomen:  Soft, non tender, no hepatosplenomegaly or masses Pelvic:  External genitalia is normal in appearance.  The vagina is normal in appearance. The cervix is bulbous, no CMT.  Thin prep pap is done with HR HPV cotesting. Uterus is felt to be normal size, shape, and contour.  No adnexal masses or tenderness noted. Extremities:  No swelling or varicosities noted Psych:  She has a  normal mood and affect  Assessment:   Healthy well-woman exam  Plan:  Desires trial of Caya diaphragm- prescription given.  Labs ordered- will follow up accordingly. F/U 1 year for AE, or sooner if needed   Paige Brown Suzan NailerN Paige Brown, CNM

## 2018-11-19 ENCOUNTER — Other Ambulatory Visit: Payer: 59

## 2018-11-20 ENCOUNTER — Other Ambulatory Visit: Payer: 59

## 2018-11-22 LAB — CYTOLOGY - PAP
Diagnosis: NEGATIVE
HPV: NOT DETECTED

## 2018-11-23 ENCOUNTER — Other Ambulatory Visit: Payer: 59

## 2018-11-23 ENCOUNTER — Other Ambulatory Visit: Payer: Self-pay

## 2018-11-23 DIAGNOSIS — Z01419 Encounter for gynecological examination (general) (routine) without abnormal findings: Secondary | ICD-10-CM | POA: Diagnosis not present

## 2018-11-24 LAB — CBC
Hematocrit: 41.8 % (ref 34.0–46.6)
Hemoglobin: 13.8 g/dL (ref 11.1–15.9)
MCH: 29.9 pg (ref 26.6–33.0)
MCHC: 33 g/dL (ref 31.5–35.7)
MCV: 91 fL (ref 79–97)
Platelets: 144 10*3/uL — ABNORMAL LOW (ref 150–450)
RBC: 4.62 x10E6/uL (ref 3.77–5.28)
RDW: 13 % (ref 11.7–15.4)
WBC: 4.1 10*3/uL (ref 3.4–10.8)

## 2018-11-24 LAB — LIPID PANEL
Chol/HDL Ratio: 2.7 ratio (ref 0.0–4.4)
Cholesterol, Total: 169 mg/dL (ref 100–199)
HDL: 62 mg/dL (ref >39)
LDL Calculated: 98 mg/dL (ref 0–99)
Triglycerides: 47 mg/dL (ref 0–149)
VLDL Cholesterol Cal: 9 mg/dL (ref 5–40)

## 2018-11-24 LAB — COMPREHENSIVE METABOLIC PANEL
ALT: 15 IU/L (ref 0–32)
AST: 18 IU/L (ref 0–40)
Albumin/Globulin Ratio: 2.3 — ABNORMAL HIGH (ref 1.2–2.2)
Albumin: 4.9 g/dL — ABNORMAL HIGH (ref 3.8–4.8)
Alkaline Phosphatase: 73 IU/L (ref 39–117)
BUN/Creatinine Ratio: 20 (ref 9–23)
BUN: 13 mg/dL (ref 6–20)
Bilirubin Total: 0.6 mg/dL (ref 0.0–1.2)
CO2: 26 mmol/L (ref 20–29)
Calcium: 9.5 mg/dL (ref 8.7–10.2)
Chloride: 102 mmol/L (ref 96–106)
Creatinine, Ser: 0.66 mg/dL (ref 0.57–1.00)
GFR calc Af Amer: 136 mL/min/{1.73_m2} (ref >59)
GFR calc non Af Amer: 118 mL/min/{1.73_m2} (ref >59)
Globulin, Total: 2.1 g/dL (ref 1.5–4.5)
Glucose: 75 mg/dL (ref 65–99)
Potassium: 4.2 mmol/L (ref 3.5–5.2)
Sodium: 142 mmol/L (ref 134–144)
Total Protein: 7 g/dL (ref 6.0–8.5)

## 2018-11-24 LAB — HEMOGLOBIN A1C
Est. average glucose Bld gHb Est-mCnc: 88 mg/dL
Hgb A1c MFr Bld: 4.7 % — ABNORMAL LOW (ref 4.8–5.6)

## 2018-11-24 LAB — FERRITIN: Ferritin: 29 ng/mL (ref 15–150)

## 2018-11-24 LAB — VITAMIN D 25 HYDROXY (VIT D DEFICIENCY, FRACTURES): Vit D, 25-Hydroxy: 42.4 ng/mL (ref 30.0–100.0)

## 2019-01-25 DIAGNOSIS — M5136 Other intervertebral disc degeneration, lumbar region: Secondary | ICD-10-CM | POA: Diagnosis not present

## 2019-01-25 DIAGNOSIS — M5137 Other intervertebral disc degeneration, lumbosacral region: Secondary | ICD-10-CM | POA: Diagnosis not present

## 2019-01-25 DIAGNOSIS — M531 Cervicobrachial syndrome: Secondary | ICD-10-CM | POA: Diagnosis not present

## 2019-01-25 DIAGNOSIS — M955 Acquired deformity of pelvis: Secondary | ICD-10-CM | POA: Diagnosis not present

## 2019-01-25 DIAGNOSIS — M9902 Segmental and somatic dysfunction of thoracic region: Secondary | ICD-10-CM | POA: Diagnosis not present

## 2019-01-25 DIAGNOSIS — M546 Pain in thoracic spine: Secondary | ICD-10-CM | POA: Diagnosis not present

## 2019-01-25 DIAGNOSIS — M9905 Segmental and somatic dysfunction of pelvic region: Secondary | ICD-10-CM | POA: Diagnosis not present

## 2019-01-25 DIAGNOSIS — M9903 Segmental and somatic dysfunction of lumbar region: Secondary | ICD-10-CM | POA: Diagnosis not present

## 2019-01-25 DIAGNOSIS — M9901 Segmental and somatic dysfunction of cervical region: Secondary | ICD-10-CM | POA: Diagnosis not present

## 2019-03-06 ENCOUNTER — Other Ambulatory Visit: Payer: Self-pay

## 2019-03-06 DIAGNOSIS — Z20822 Contact with and (suspected) exposure to covid-19: Secondary | ICD-10-CM

## 2019-03-07 LAB — NOVEL CORONAVIRUS, NAA

## 2019-05-03 NOTE — L&D Delivery Note (Signed)
Delivery Note  1056 Called in room to see patient, reports pelvic pressure and the urge to push. SVE: anterior lip, 100/+2, vertex.   Encouraged patient to change position and empty bladder.   Patient returned to labor bed with minimal assistance. Advised RN that "baby was coming". CNM to bedside.   Spontaneous vaginal birth of liveborn female infant in left occiput anterior position at 1104. Infant immediately to maternal abdomen. Skin to skin, delayed cord clamping, three (3) vessel cord and tube of cord blood collected. APGARs: 8, 9. Weight pending. Receiving nurse present at bedside for birth.   Small gush of blood noted. Pitocin bolus infusing. Spontaneous delivery of intact placenta at 1111, followed by trailing membranes. Uterus firm. Rubra small. First degree perineal laceration hemostatic, unrepaired. Vault check completed. Counts correct x 2. QBL: 197 ml. Anesthesia none.   Initiate routine postpartum care and orders. Mom to postpartum.  Baby to Couplet care / Skin to Skin.  FOB present at bedside and overjoyed with the birth of "Corky Downs".    Serafina Royals, CNM [facility], [facility] 03/30/2020, 11:19 AM

## 2019-08-28 ENCOUNTER — Ambulatory Visit (INDEPENDENT_AMBULATORY_CARE_PROVIDER_SITE_OTHER): Payer: BLUE CROSS/BLUE SHIELD | Admitting: Certified Nurse Midwife

## 2019-08-28 ENCOUNTER — Telehealth: Payer: Self-pay

## 2019-08-28 ENCOUNTER — Other Ambulatory Visit: Payer: Self-pay

## 2019-08-28 ENCOUNTER — Encounter: Payer: Self-pay | Admitting: Certified Nurse Midwife

## 2019-08-28 VITALS — BP 133/80 | HR 102 | Ht 66.0 in | Wt 165.4 lb

## 2019-08-28 DIAGNOSIS — N912 Amenorrhea, unspecified: Secondary | ICD-10-CM | POA: Diagnosis not present

## 2019-08-28 LAB — POCT URINE PREGNANCY: Preg Test, Ur: POSITIVE — AB

## 2019-08-28 MED ORDER — ONDANSETRON 4 MG PO TBDP: 4.0000 mg | ORAL_TABLET | Freq: Three times a day (TID) | ORAL | 0 refills | Status: DC | PRN

## 2019-08-28 NOTE — Patient Instructions (Signed)

## 2019-08-28 NOTE — Telephone Encounter (Signed)
mychart message sent to patient

## 2019-08-28 NOTE — Progress Notes (Signed)
Subjective:    Paige Brown is a 34 y.o. female who presents for evaluation of amenorrhea. She believes she could be pregnant. Pregnancy is desired. Sexual Activity: single partner, contraception: none. Current symptoms also include: fatigue, morning sickness, nausea and positive home pregnancy test. Last period was normal.   No LMP recorded. (Menstrual status: Lactating). The following portions of the patient's history were reviewed and updated as appropriate: allergies, current medications, past family history, past medical history, past social history, past surgical history and problem list.  Review of Systems Pertinent items are noted in HPI.     Objective:    There were no vitals taken for this visit. General: alert, cooperative, appears stated age, fatigued and no acute distress    Lab Review Urine HCG: positive    Assessment:    Absence of menstruation.     Plan:   Positive:  Briefly discussed pre-natal care options. MD or midwifery care reviewed. Pt midwife pt. Encouraged well-balanced diet, plenty of rest when needed, pre-natal vitamins daily and walking for exercise. Discussed self-help for nausea, avoiding OTC medications until consulting provider or pharmacist, other than Tylenol as needed, minimal caffeine (1-2 cups daily) and avoiding alcohol. U/s for dating next week, She will schedule her nurse visit @ 10 wks her initial OB visit 12-13 wks. . Pt taking vitamin B 6 , doing ginger, requesting Zofran for nausea. Discussed risk s and beneifits verbalizes understanding. Orders placed. Feel free to call with any questions.  Doreene Burke, CNM

## 2019-09-02 ENCOUNTER — Encounter: Payer: 59 | Admitting: Certified Nurse Midwife

## 2019-09-03 ENCOUNTER — Other Ambulatory Visit: Payer: Self-pay | Admitting: Certified Nurse Midwife

## 2019-09-03 DIAGNOSIS — Z789 Other specified health status: Secondary | ICD-10-CM

## 2019-09-03 NOTE — Addendum Note (Signed)
Addended by: Mechele Claude on: 09/03/2019 11:48 AM   Modules accepted: Level of Service

## 2019-09-04 ENCOUNTER — Ambulatory Visit (INDEPENDENT_AMBULATORY_CARE_PROVIDER_SITE_OTHER): Payer: BLUE CROSS/BLUE SHIELD

## 2019-09-04 ENCOUNTER — Encounter: Payer: Self-pay | Admitting: Certified Nurse Midwife

## 2019-09-04 ENCOUNTER — Other Ambulatory Visit: Payer: Self-pay

## 2019-09-04 DIAGNOSIS — Z3A08 8 weeks gestation of pregnancy: Secondary | ICD-10-CM

## 2019-09-04 DIAGNOSIS — Z789 Other specified health status: Secondary | ICD-10-CM

## 2019-09-20 ENCOUNTER — Ambulatory Visit (INDEPENDENT_AMBULATORY_CARE_PROVIDER_SITE_OTHER): Payer: BLUE CROSS/BLUE SHIELD | Admitting: Certified Nurse Midwife

## 2019-09-20 ENCOUNTER — Other Ambulatory Visit: Payer: Self-pay

## 2019-09-20 ENCOUNTER — Encounter: Payer: Self-pay | Admitting: Certified Nurse Midwife

## 2019-09-20 VITALS — BP 119/80 | HR 102 | Ht 66.0 in | Wt 170.6 lb

## 2019-09-20 DIAGNOSIS — Z3491 Encounter for supervision of normal pregnancy, unspecified, first trimester: Secondary | ICD-10-CM

## 2019-09-20 NOTE — Progress Notes (Signed)
Paige Brown presents for NOB nurse interview visit. Pregnancy confirmation done 08/28/2019. G-5. P4004. Pregnancy education material explained and given. 0 cats in home. NOB labs ordered. HIV labs and drug screen were explained and ordered. PNV encouraged. Genetic screening options discussed. Genetic testing: Done Patient may discuss with the provider. Patient to follow up with provider 10/02/19 for NOB physical. All questions answered.  Patient stated that she will be needing her protonix soon. I told patient I would let Pattricia Boss know. Patient had no questions. Panorama done today.

## 2019-09-21 LAB — CBC WITH DIFFERENTIAL/PLATELET
Basophils Absolute: 0 10*3/uL (ref 0.0–0.2)
Basos: 0 %
EOS (ABSOLUTE): 0 10*3/uL (ref 0.0–0.4)
Eos: 1 %
Hematocrit: 39.5 % (ref 34.0–46.6)
Hemoglobin: 13.3 g/dL (ref 11.1–15.9)
Immature Grans (Abs): 0 10*3/uL (ref 0.0–0.1)
Immature Granulocytes: 0 %
Lymphocytes Absolute: 1.3 10*3/uL (ref 0.7–3.1)
Lymphs: 25 %
MCH: 30.2 pg (ref 26.6–33.0)
MCHC: 33.7 g/dL (ref 31.5–35.7)
MCV: 90 fL (ref 79–97)
Monocytes Absolute: 0.4 10*3/uL (ref 0.1–0.9)
Monocytes: 7 %
Neutrophils Absolute: 3.4 10*3/uL (ref 1.4–7.0)
Neutrophils: 67 %
Platelets: 162 10*3/uL (ref 150–450)
RBC: 4.4 x10E6/uL (ref 3.77–5.28)
RDW: 13.3 % (ref 11.7–15.4)
WBC: 5 10*3/uL (ref 3.4–10.8)

## 2019-09-21 LAB — URINALYSIS, ROUTINE W REFLEX MICROSCOPIC
Bilirubin, UA: NEGATIVE
Glucose, UA: NEGATIVE
Ketones, UA: NEGATIVE
Leukocytes,UA: NEGATIVE
Nitrite, UA: NEGATIVE
Protein,UA: NEGATIVE
RBC, UA: NEGATIVE
Specific Gravity, UA: 1.009 (ref 1.005–1.030)
Urobilinogen, Ur: 0.2 mg/dL (ref 0.2–1.0)
pH, UA: 7 (ref 5.0–7.5)

## 2019-09-21 LAB — HIV ANTIBODY (ROUTINE TESTING W REFLEX): HIV Screen 4th Generation wRfx: NONREACTIVE

## 2019-09-21 LAB — ABO AND RH: Rh Factor: POSITIVE

## 2019-09-21 LAB — HEPATITIS B SURFACE ANTIGEN: Hepatitis B Surface Ag: NEGATIVE

## 2019-09-21 LAB — RPR: RPR Ser Ql: NONREACTIVE

## 2019-09-21 LAB — RUBELLA SCREEN: Rubella Antibodies, IGG: 20.8 index (ref >0.99)

## 2019-09-21 LAB — ANTIBODY SCREEN: Antibody Screen: NEGATIVE

## 2019-09-21 LAB — VARICELLA ZOSTER ANTIBODY, IGG: Varicella zoster IgG: 495 index (ref >165)

## 2019-09-23 LAB — MONITOR DRUG PROFILE 14(MW)
Amphetamine Scrn, Ur: NEGATIVE ng/mL
BARBITURATE SCREEN URINE: NEGATIVE ng/mL
BENZODIAZEPINE SCREEN, URINE: NEGATIVE ng/mL
Buprenorphine, Urine: NEGATIVE ng/mL
CANNABINOIDS UR QL SCN: NEGATIVE ng/mL
Cocaine (Metab) Scrn, Ur: NEGATIVE ng/mL
Creatinine(Crt), U: 46.7 mg/dL (ref 20.0–300.0)
Fentanyl, Urine: NEGATIVE pg/mL
Meperidine Screen, Urine: NEGATIVE ng/mL
Methadone Screen, Urine: NEGATIVE ng/mL
OXYCODONE+OXYMORPHONE UR QL SCN: NEGATIVE ng/mL
Opiate Scrn, Ur: NEGATIVE ng/mL
Ph of Urine: 6.5 (ref 4.5–8.9)
Phencyclidine Qn, Ur: NEGATIVE ng/mL
Propoxyphene Scrn, Ur: NEGATIVE ng/mL
SPECIFIC GRAVITY: 1.006
Tramadol Screen, Urine: NEGATIVE ng/mL

## 2019-09-23 LAB — GC/CHLAMYDIA PROBE AMP
Chlamydia trachomatis, NAA: NEGATIVE
Neisseria Gonorrhoeae by PCR: NEGATIVE

## 2019-09-24 ENCOUNTER — Other Ambulatory Visit: Payer: Self-pay | Admitting: Certified Nurse Midwife

## 2019-09-24 LAB — URINE CULTURE, OB REFLEX

## 2019-09-24 LAB — CULTURE, OB URINE

## 2019-09-24 MED ORDER — CLINDAMYCIN HCL 300 MG PO CAPS
300.0000 mg | ORAL_CAPSULE | Freq: Three times a day (TID) | ORAL | 0 refills | Status: AC
Start: 2019-09-24 — End: 2019-10-01

## 2019-10-02 ENCOUNTER — Encounter: Payer: Self-pay | Admitting: Certified Nurse Midwife

## 2019-10-02 ENCOUNTER — Other Ambulatory Visit: Payer: Self-pay

## 2019-10-02 ENCOUNTER — Ambulatory Visit (INDEPENDENT_AMBULATORY_CARE_PROVIDER_SITE_OTHER): Payer: BLUE CROSS/BLUE SHIELD | Admitting: Certified Nurse Midwife

## 2019-10-02 VITALS — BP 120/76 | HR 106 | Wt 170.1 lb

## 2019-10-02 DIAGNOSIS — Z3A12 12 weeks gestation of pregnancy: Secondary | ICD-10-CM | POA: Diagnosis not present

## 2019-10-02 LAB — POCT URINALYSIS DIPSTICK OB
Bilirubin, UA: NEGATIVE
Blood, UA: NEGATIVE
Glucose, UA: NEGATIVE
Ketones, UA: NEGATIVE
Leukocytes, UA: NEGATIVE
Nitrite, UA: NEGATIVE
POC,PROTEIN,UA: NEGATIVE
Spec Grav, UA: 1.02 (ref 1.010–1.025)
Urobilinogen, UA: 0.2 E.U./dL
pH, UA: 6.5 (ref 5.0–8.0)

## 2019-10-02 NOTE — Progress Notes (Signed)
NEW OB HISTORY AND PHYSICAL  SUBJECTIVE:       Paige Brown is a 34 y.o. G32P4004 female, Patient's last menstrual period was 07/22/2019., Estimated Date of Delivery: 04/12/20, [redacted]w[redacted]d presents today for establishment of Prenatal Care. She has no unusual complaints.    Gynecologic History Patient's last menstrual period was 07/22/2019. Normal Contraception: none Last Pap: 11/16/18 Results were: normal  Obstetric History OB History  Gravida Para Term Preterm AB Living  '5 4 4     4  ' SAB TAB Ectopic Multiple Live Births        0 4    # Outcome Date GA Lbr Len/2nd Weight Sex Delivery Anes PTL Lv  5 Current           4 Term 03/16/18 328w0d0:15 / 00:06 8 lb 0.8 oz (3.65 kg) M VBAC None  LIV  3 Term 06/11/16 3745w3d00:41 6 lb 14 oz (3.118 kg) M Vag-Spont None  LIV  2 Term 09/11/14 39w66w1dlb 5 oz (3.771 kg) M   Y LIV  1 Term 10/14/11 39w120w1db 10 oz (3.459 kg) F CS-Unspec  N LIV    Obstetric Comments  1st pregnancy-breech  2nd pregnancy: PTL at 35 wks./ VBAC- no assistance    Past Medical History:  Diagnosis Date  . Anemia    with pregnancies  . GERD (gastroesophageal reflux disease)    during pregnancy  . UTI (lower urinary tract infection) 11/09/2015   2 more days to complete ATB    Past Surgical History:  Procedure Laterality Date  . CESAREAN SECTION    . TONSILLECTOMY  1994   and adnoids  . WISDOM TOOTH EXTRACTION     2000    Current Outpatient Medications on File Prior to Visit  Medication Sig Dispense Refill  . ondansetron (ZOFRAN-ODT) 4 MG disintegrating tablet Take 1 tablet (4 mg total) by mouth every 8 (eight) hours as needed for nausea or vomiting. 20 tablet 0  . Prenatal Vit-Fe Fumarate-FA (PRENATAL MULTIVITAMIN) TABS tablet Take 1 tablet by mouth daily at 12 noon.     No current facility-administered medications on file prior to visit.    Allergies  Allergen Reactions  . Ciprofloxacin Nausea And Vomiting  . Penicillins Hives  . Shellfish Allergy  Swelling  . Cefaclor Hives  . Levaquin [Levofloxacin]     Social History   Socioeconomic History  . Marital status: Married    Spouse name: Not on file  . Number of children: Not on file  . Years of education: Not on file  . Highest education level: Not on file  Occupational History  . Occupation: RN   Programmer, [employer] [place] Tobacco Use  . Smoking status: Never Smoker  . Smokeless tobacco: Never Used  Substance and Sexual Activity  . Alcohol use: Not Currently    Alcohol/week: 0.0 standard drinks  . Drug use: No  . Sexual activity: Yes    Partners: Male    Birth control/protection: Condom  Other Topics Concern  . Not on file  Social History Narrative  . Not on file   Social Determinants of Health   Financial Resource Strain:   . Difficulty of Paying Living Expenses:   Food Insecurity:   . Worried About RunniCharity fundraiserhe Last Year:   . Ran OArboriculturisthe Last Year:   Transportation Needs:   . Lack Film/video editorical):   .Marland Kitchen  Lack of Transportation (Non-Medical):   Physical Activity:   . Days of Exercise per Week:   . Minutes of Exercise per Session:   Stress:   . Feeling of Stress :   Social Connections:   . Frequency of Communication with Friends and Family:   . Frequency of Social Gatherings with Friends and Family:   . Attends Religious Services:   . Active Member of Clubs or Organizations:   . Attends Archivist Meetings:   Marland Kitchen Marital Status:   Intimate Partner Violence:   . Fear of Current or Ex-Partner:   . Emotionally Abused:   Marland Kitchen Physically Abused:   . Sexually Abused:     Family History  Problem Relation Age of Onset  . Cancer Father        multiple Myeloma  . Rheum arthritis Maternal Grandmother   . Osteoarthritis Maternal Grandmother   . Breast cancer Maternal Grandmother 60  . Thyroid disease Maternal Grandmother   . Heart failure Maternal Grandfather   . Hyperlipidemia Maternal Grandfather   .  Hypertension Maternal Grandfather   . Asthma Paternal Grandmother   . Stroke Paternal Grandfather   . Rheum arthritis Maternal Aunt   . Rheum arthritis Maternal Uncle   . Ovarian cancer Neg Hx   . Colon cancer Neg Hx     The following portions of the patient's history were reviewed and updated as appropriate: allergies, current medications, past OB history, past medical history, past surgical history, past family history, past social history, and problem list.    OBJECTIVE: Initial Physical Exam (New OB)  GENERAL APPEARANCE: alert, well appearing, in no apparent distress, oriented to person, place and time HEAD: normocephalic, atraumatic MOUTH: mucous membranes moist, pharynx normal without lesions THYROID: no thyromegaly or masses present BREASTS: no masses noted, no significant tenderness, no palpable axillary nodes, no skin changes LUNGS: clear to auscultation, no wheezes, rales or rhonchi, symmetric air entry HEART: regular rate and rhythm, no murmurs ABDOMEN: soft, nontender, nondistended, no abnormal masses, no epigastric pain EXTREMITIES: no redness or tenderness in the calves or thighs SKIN: normal coloration and turgor, no rashes LYMPH NODES: no adenopathy palpable NEUROLOGIC: alert, oriented, normal speech, no focal findings or movement disorder noted  PELVIC EXAM EXTERNAL GENITALIA: normal appearing vulva with no masses, tenderness or lesions VAGINA: no abnormal discharge or lesions CERVIX: no lesions or cervical motion tenderness UTERUS: gravid ADNEXA: no masses palpable and nontender OB EXAM PELVIMETRY: appears adequate RECTUM: exam not indicated  ASSESSMENT: Normal pregnancy  PLAN: New OB counseling: The patient has been given an overview regarding routine prenatal care. Recommendations regarding diet, weight gain, and exercise in pregnancy were given. Prenatal testing, optional genetic testing, carrier screening test, and ultrasound use in pregnancy were  reviewed. Pt had panorama completed. Low risk female. Benefits of Breast Feeding were discussed. The patient is encouraged to consider nursing her baby post partum.  Philip Aspen, CNM

## 2019-10-02 NOTE — Patient Instructions (Signed)

## 2019-10-31 ENCOUNTER — Other Ambulatory Visit: Payer: Self-pay

## 2019-10-31 ENCOUNTER — Ambulatory Visit (INDEPENDENT_AMBULATORY_CARE_PROVIDER_SITE_OTHER): Payer: BLUE CROSS/BLUE SHIELD | Admitting: Certified Nurse Midwife

## 2019-10-31 VITALS — BP 121/84 | HR 100 | Wt 170.3 lb

## 2019-10-31 DIAGNOSIS — Z3A16 16 weeks gestation of pregnancy: Secondary | ICD-10-CM

## 2019-10-31 DIAGNOSIS — Z3492 Encounter for supervision of normal pregnancy, unspecified, second trimester: Secondary | ICD-10-CM

## 2019-10-31 DIAGNOSIS — K219 Gastro-esophageal reflux disease without esophagitis: Secondary | ICD-10-CM

## 2019-10-31 DIAGNOSIS — O99619 Diseases of the digestive system complicating pregnancy, unspecified trimester: Secondary | ICD-10-CM

## 2019-10-31 DIAGNOSIS — Z3689 Encounter for other specified antenatal screening: Secondary | ICD-10-CM

## 2019-10-31 DIAGNOSIS — R8271 Bacteriuria: Secondary | ICD-10-CM

## 2019-10-31 LAB — POCT URINALYSIS DIPSTICK OB
Bilirubin, UA: NEGATIVE
Blood, UA: NEGATIVE
Glucose, UA: NEGATIVE
Ketones, UA: NEGATIVE
Leukocytes, UA: NEGATIVE
Nitrite, UA: NEGATIVE
POC,PROTEIN,UA: NEGATIVE
Spec Grav, UA: 1.02 (ref 1.010–1.025)
Urobilinogen, UA: 0.2 E.U./dL
pH, UA: 5 (ref 5.0–8.0)

## 2019-10-31 NOTE — Patient Instructions (Signed)
Common Medications Safe in Pregnancy  Acne:      Constipation:  Benzoyl Peroxide     Colace  Clindamycin      Dulcolax Suppository  Topica Erythromycin     Fibercon  Salicylic Acid      Metamucil         Miralax AVOID:        Senakot   Accutane    Cough:  Retin-A       Cough Drops  Tetracycline      Phenergan w/ Codeine if Rx  Minocycline      Robitussin (Plain & DM)  Antibiotics:     Crabs/Lice:  Ceclor       RID  Cephalosporins    AVOID:  E-Mycins      Kwell  Keflex  Macrobid/Macrodantin   Diarrhea:  Penicillin      Kao-Pectate  Zithromax      Imodium AD         PUSH FLUIDS AVOID:       Cipro     Fever:  Tetracycline      Tylenol (Regular or Extra  Minocycline       Strength)  Levaquin      Extra Strength-Do not          Exceed 8 tabs/24 hrs Caffeine:        <200mg/day (equiv. To 1 cup of coffee or  approx. 3 12 oz sodas)         Gas: Cold/Hayfever:       Gas-X  Benadryl      Mylicon  Claritin       Phazyme  **Claritin-D        Chlor-Trimeton    Headaches:  Dimetapp      ASA-Free Excedrin  Drixoral-Non-Drowsy     Cold Compress  Mucinex (Guaifenasin)     Tylenol (Regular or Extra  Sudafed/Sudafed-12 Hour     Strength)  **Sudafed PE Pseudoephedrine   Tylenol Cold & Sinus     Vicks Vapor Rub  Zyrtec  **AVOID if Problems With Blood Pressure         Heartburn: Avoid lying down for at least 1 hour after meals  Aciphex      Maalox     Rash:  Milk of Magnesia     Benadryl    Mylanta       1% Hydrocortisone Cream  Pepcid  Pepcid Complete   Sleep Aids:  Prevacid      Ambien   Prilosec       Benadryl  Rolaids       Chamomile Tea  Tums (Limit 4/day)     Unisom  Zantac       Tylenol PM         Warm milk-add vanilla or  Hemorrhoids:       Sugar for taste  Anusol/Anusol H.C.  (RX: Analapram 2.5%)  Sugar Substitutes:  Hydrocortisone OTC     Ok in moderation  Preparation H      Tucks        Vaseline lotion applied to tissue with  wiping    Herpes:     Throat:  Acyclovir      Oragel  Famvir  Valtrex     Vaccines:         Flu Shot Leg Cramps:       *Gardasil  Benadryl      Hepatitis A         Hepatitis B Nasal Spray:         Pneumovax  Saline Nasal Spray     Polio Booster         Tetanus Nausea:       Tuberculosis test or PPD  Vitamin B6 25 mg TID   AVOID:    Dramamine      *Gardasil  Emetrol       Live Poliovirus  Ginger Root 250 mg QID    MMR (measles, mumps &  High Complex Carbs @ Bedtime    rebella)  Sea Bands-Accupressure    Varicella (Chickenpox)  Unisom 1/2 tab TID     *No known complications           If received before Pain:         Known pregnancy;   Darvocet       Resume series after  Lortab        Delivery  Percocet    Yeast:   Tramadol      Femstat  Tylenol 3      Gyne-lotrimin  Ultram       Monistat  Vicodin           MISC:         All Sunscreens           Hair Coloring/highlights          Insect Repellant's          (Including DEET)         Mystic Tans    Second Trimester of Pregnancy  The second trimester is from week 14 through week 27 (month 4 through 6). This is often the time in pregnancy that you feel your best. Often times, morning sickness has lessened or quit. You may have more energy, and you may get hungry more often. Your unborn baby is growing rapidly. At the end of the sixth month, he or she is about 9 inches long and weighs about 1 pounds. You will likely feel the baby move between 18 and 20 weeks of pregnancy. Follow these instructions at home: Medicines  Take over-the-counter and prescription medicines only as told by your doctor. Some medicines are safe and some medicines are not safe during pregnancy.  Take a prenatal vitamin that contains at least 600 micrograms (mcg) of folic acid.  If you have trouble pooping (constipation), take medicine that will make your stool soft (stool softener) if your doctor approves. Eating and drinking   Eat regular, healthy  meals.  Avoid raw meat and uncooked cheese.  If you get low calcium from the food you eat, talk to your doctor about taking a daily calcium supplement.  Avoid foods that are high in fat and sugars, such as fried and sweet foods.  If you feel sick to your stomach (nauseous) or throw up (vomit): ? Eat 4 or 5 small meals a day instead of 3 large meals. ? Try eating a few soda crackers. ? Drink liquids between meals instead of during meals.  To prevent constipation: ? Eat foods that are high in fiber, like fresh fruits and vegetables, whole grains, and beans. ? Drink enough fluids to keep your pee (urine) clear or pale yellow. Activity  Exercise only as told by your doctor. Stop exercising if you start to have cramps.  Do not exercise if it is too hot, too humid, or if you are in a place of great height (high altitude).  Avoid heavy lifting.  Wear low-heeled shoes. Sit and stand up straight.  You can continue to have sex   unless your doctor tells you not to. Relieving pain and discomfort  Wear a good support bra if your breasts are tender.  Take warm water baths (sitz baths) to soothe pain or discomfort caused by hemorrhoids. Use hemorrhoid cream if your doctor approves.  Rest with your legs raised if you have leg cramps or low back pain.  If you develop puffy, bulging veins (varicose veins) in your legs: ? Wear support hose or compression stockings as told by your doctor. ? Raise (elevate) your feet for 15 minutes, 3-4 times a day. ? Limit salt in your food. Prenatal care  Write down your questions. Take them to your prenatal visits.  Keep all your prenatal visits as told by your doctor. This is important. Safety  Wear your seat belt when driving.  Make a list of emergency phone numbers, including numbers for family, friends, the hospital, and police and fire departments. General instructions  Ask your doctor about the right foods to eat or for help finding a counselor,  if you need these services.  Ask your doctor about local prenatal classes. Begin classes before month 6 of your pregnancy.  Do not use hot tubs, steam rooms, or saunas.  Do not douche or use tampons or scented sanitary pads.  Do not cross your legs for long periods of time.  Visit your dentist if you have not done so. Use a soft toothbrush to brush your teeth. Floss gently.  Avoid all smoking, herbs, and alcohol. Avoid drugs that are not approved by your doctor.  Do not use any products that contain nicotine or tobacco, such as cigarettes and e-cigarettes. If you need help quitting, ask your doctor.  Avoid cat litter boxes and soil used by cats. These carry germs that can cause birth defects in the baby and can cause a loss of your baby (miscarriage) or stillbirth. Contact a doctor if:  You have mild cramps or pressure in your lower belly.  You have pain when you pee (urinate).  You have bad smelling fluid coming from your vagina.  You continue to feel sick to your stomach (nauseous), throw up (vomit), or have watery poop (diarrhea).  You have a nagging pain in your belly area.  You feel dizzy. Get help right away if:  You have a fever.  You are leaking fluid from your vagina.  You have spotting or bleeding from your vagina.  You have severe belly cramping or pain.  You lose or gain weight rapidly.  You have trouble catching your breath and have chest pain.  You notice sudden or extreme puffiness (swelling) of your face, hands, ankles, feet, or legs.  You have not felt the baby move in over an hour.  You have severe headaches that do not go away when you take medicine.  You have trouble seeing. Summary  The second trimester is from week 14 through week 27 (months 4 through 6). This is often the time in pregnancy that you feel your best.  To take care of yourself and your unborn baby, you will need to eat healthy meals, take medicines only if your doctor tells  you to do so, and do activities that are safe for you and your baby.  Call your doctor if you get sick or if you notice anything unusual about your pregnancy. Also, call your doctor if you need help with the right food to eat, or if you want to know what activities are safe for you. This information is not   intended to replace advice given to you by your health care provider. Make sure you discuss any questions you have with your health care provider. Document Revised: 08/10/2018 Document Reviewed: 05/24/2016 Elsevier Patient Education  2020 Elsevier Inc.  

## 2019-11-02 LAB — URINE CULTURE

## 2019-11-02 MED ORDER — PANTOPRAZOLE SODIUM 40 MG PO TBEC: 40.0000 mg | DELAYED_RELEASE_TABLET | Freq: Every day | ORAL | 5 refills | Status: DC

## 2019-11-02 NOTE — Progress Notes (Signed)
ROB-Reports increased reflux despite home treatment measures. Rx Protonix, see orders. Anticipatory guidance regarding course of prenatal care. Reviewed red flag symptoms and when to call. RTC x 4 weeks for ANATOMY SCAN and ROB or sooner if needed.

## 2019-11-14 ENCOUNTER — Telehealth: Payer: Self-pay

## 2019-11-14 NOTE — Telephone Encounter (Signed)
Which medications? Did this happen already? JML

## 2019-11-14 NOTE — Telephone Encounter (Signed)
mychart message sent to patient

## 2019-11-18 ENCOUNTER — Other Ambulatory Visit: Payer: Self-pay | Admitting: Certified Nurse Midwife

## 2019-11-18 ENCOUNTER — Telehealth: Payer: Self-pay

## 2019-11-18 ENCOUNTER — Telehealth: Payer: Self-pay | Admitting: Certified Nurse Midwife

## 2019-11-18 DIAGNOSIS — K219 Gastro-esophageal reflux disease without esophagitis: Secondary | ICD-10-CM

## 2019-11-18 MED ORDER — FAMOTIDINE 20 MG PO TABS: 20.0000 mg | ORAL_TABLET | Freq: Two times a day (BID) | ORAL | 3 refills | Status: DC

## 2019-11-18 NOTE — Telephone Encounter (Signed)
Paige Brown or Paige Brown: Please check status of preauthorization. Tabitha: Please contact patient, thank her for her patience issue is with insurance and not our office. Prescription for Pepcid as been sent to pharmacy until situation is resolved. Initial message was sent via MyChart on 11/12/2019 which was less than one (1) week ago. Thanks, JML

## 2019-11-18 NOTE — Telephone Encounter (Signed)
Attempted to do a PA on protonix- Covermymeds is showing patient is inactive with BC/BS which is listed as current coverage. Call placed to patients preferred pharmacy to send request.  A call was also placed to patient to get confirmation of current medical coverage. Voicemail message left to office.

## 2019-11-18 NOTE — Telephone Encounter (Signed)
mychart message sent to patient

## 2019-11-18 NOTE — Telephone Encounter (Signed)
Patient has been contacted. Patient appreciated a call back and that we were working on this issue for her.

## 2019-11-18 NOTE — Telephone Encounter (Signed)
Patient states she has tried to get her prescription protonix several times but it needs a prior authorization. Patient is becoming very irritable because she doesn't know what the issue is, she states she really needs this medication. Patient has left several MyChart messages and has requested [facility] continuously fax over prior authorization reminders but nothing has been done about them and its been 2 weeks. Could you please advise?

## 2019-11-18 NOTE — Progress Notes (Signed)
Rx Pepcid, see orders.   Serafina Royals, CNM [facility], [facility] 11/18/19 3:35 PM

## 2019-11-19 ENCOUNTER — Telehealth: Payer: Self-pay

## 2019-11-19 NOTE — Telephone Encounter (Signed)
mychart message sent to patient

## 2019-11-20 ENCOUNTER — Telehealth: Payer: Self-pay

## 2019-11-20 NOTE — Telephone Encounter (Signed)
mychart message sent to patient

## 2019-11-20 NOTE — Telephone Encounter (Signed)
Mychart message sent to patient.

## 2019-11-22 ENCOUNTER — Telehealth: Payer: Self-pay

## 2019-11-22 NOTE — Telephone Encounter (Signed)
mychart message sent to patient

## 2019-11-22 NOTE — Telephone Encounter (Signed)
mychart message sent to patient informing her of approval of pantoprazole.

## 2019-12-03 ENCOUNTER — Ambulatory Visit (INDEPENDENT_AMBULATORY_CARE_PROVIDER_SITE_OTHER): Payer: BLUE CROSS/BLUE SHIELD | Admitting: Certified Nurse Midwife

## 2019-12-03 ENCOUNTER — Ambulatory Visit (INDEPENDENT_AMBULATORY_CARE_PROVIDER_SITE_OTHER): Payer: BLUE CROSS/BLUE SHIELD

## 2019-12-03 ENCOUNTER — Encounter: Payer: Self-pay | Admitting: Certified Nurse Midwife

## 2019-12-03 VITALS — BP 123/77 | HR 91 | Wt 178.2 lb

## 2019-12-03 DIAGNOSIS — Z3689 Encounter for other specified antenatal screening: Secondary | ICD-10-CM

## 2019-12-03 DIAGNOSIS — Z3492 Encounter for supervision of normal pregnancy, unspecified, second trimester: Secondary | ICD-10-CM

## 2019-12-03 DIAGNOSIS — Z3A21 21 weeks gestation of pregnancy: Secondary | ICD-10-CM

## 2019-12-03 NOTE — Progress Notes (Signed)
Rob- doing well, feels good fetal movement. U/s today for anatomy, Results reviewed. All questions answered. Follow up 3 wks with Marcelino Duster for ROB.   Doreene Burke, CNM    Patient Name: Paige Brown DOB: 01/01/1986 MRN: [id number] ULTRASOUND REPORT  Location: [facility] Date of Service: 12/03/2019   Indications:Anatomy Ultrasound Findings:  Mason Jim intrauterine pregnancy is visualized with FHR at 144 BPM. Biometrics give an (U/S) Gestational age of [redacted]w[redacted]d and an (U/S) EDD of ;04/13/2020 this correlates with the clinically established Estimated Date of Delivery: 04/12/20  Fetal presentation is Variable.  EFW: 410 g ( 14 oz). Fetal Percentile  Placenta: posterior. Grade: 1 AFI: subjectively normal.  Anatomic survey is complete and normal; Gender - female.    Right Ovary is normal in appearance. Left Ovary is normal appearance. Survey of the adnexa demonstrates no adnexal masses. There is no free peritoneal fluid in the cul de sac.  Impression: 1. [redacted]w[redacted]d Viable Singleton Intrauterine pregnancy by U/S. 2. (U/S) EDD is consistent with Clinically established Estimated Date of Delivery: 04/12/20 . 3. Normal Anatomy Scan  Recommendations: 1.Clinical correlation with the patient's History and Physical Exam.   Jenine M. Marciano Sequin    RDMS

## 2019-12-03 NOTE — Patient Instructions (Signed)

## 2019-12-27 ENCOUNTER — Encounter: Payer: Self-pay | Admitting: Certified Nurse Midwife

## 2019-12-27 ENCOUNTER — Ambulatory Visit (INDEPENDENT_AMBULATORY_CARE_PROVIDER_SITE_OTHER): Payer: BLUE CROSS/BLUE SHIELD | Admitting: Certified Nurse Midwife

## 2019-12-27 ENCOUNTER — Other Ambulatory Visit: Payer: Self-pay

## 2019-12-27 VITALS — BP 124/80 | HR 119 | Wt 181.4 lb

## 2019-12-27 DIAGNOSIS — Z3A24 24 weeks gestation of pregnancy: Secondary | ICD-10-CM

## 2019-12-27 DIAGNOSIS — Z3482 Encounter for supervision of other normal pregnancy, second trimester: Secondary | ICD-10-CM

## 2019-12-27 LAB — POCT URINALYSIS DIPSTICK OB
Bilirubin, UA: NEGATIVE
Blood, UA: NEGATIVE
Glucose, UA: NEGATIVE
Ketones, UA: NEGATIVE
Leukocytes, UA: NEGATIVE
Nitrite, UA: NEGATIVE
POC,PROTEIN,UA: NEGATIVE
Spec Grav, UA: 1.015 (ref 1.010–1.025)
Urobilinogen, UA: 0.2 E.U./dL
pH, UA: 7 (ref 5.0–8.0)

## 2019-12-27 NOTE — Patient Instructions (Signed)
WHAT OB PATIENTS CAN EXPECT   Confirmation of pregnancy and ultrasound ordered if medically indicated-[redacted] weeks gestation  New OB (NOB) intake with nurse and New OB (NOB) labs- [redacted] weeks gestation  New OB (NOB) physical examination with provider- 11/[redacted] weeks gestation  Flu vaccine-[redacted] weeks gestation  Anatomy scan-[redacted] weeks gestation  Glucose tolerance test, blood work to test for anemia, T-dap vaccine-[redacted] weeks gestation  Vaginal swabs/cultures-STD/Group B strep-[redacted] weeks gestation  Appointments every 4 weeks until 28 weeks  Every 2 weeks from 28 weeks until 36 weeks  Weekly visits from 36 weeks until delivery   Second Trimester of Pregnancy  The second trimester is from week 14 through week 27 (month 4 through 6). This is often the time in pregnancy that you feel your best. Often times, morning sickness has lessened or quit. You may have more energy, and you may get hungry more often. Your unborn baby is growing rapidly. At the end of the sixth month, he or she is about 9 inches long and weighs about 1 pounds. You will likely feel the baby move between 18 and 20 weeks of pregnancy. Follow these instructions at home: Medicines  Take over-the-counter and prescription medicines only as told by your doctor. Some medicines are safe and some medicines are not safe during pregnancy.  Take a prenatal vitamin that contains at least 600 micrograms (mcg) of folic acid.  If you have trouble pooping (constipation), take medicine that will make your stool soft (stool softener) if your doctor approves. Eating and drinking   Eat regular, healthy meals.  Avoid raw meat and uncooked cheese.  If you get low calcium from the food you eat, talk to your doctor about taking a daily calcium supplement.  Avoid foods that are high in fat and sugars, such as fried and sweet foods.  If you feel sick to your stomach (nauseous) or throw up (vomit): ? Eat 4 or 5 small meals a day instead of 3 large  meals. ? Try eating a few soda crackers. ? Drink liquids between meals instead of during meals.  To prevent constipation: ? Eat foods that are high in fiber, like fresh fruits and vegetables, whole grains, and beans. ? Drink enough fluids to keep your pee (urine) clear or pale yellow. Activity  Exercise only as told by your doctor. Stop exercising if you start to have cramps.  Do not exercise if it is too hot, too humid, or if you are in a place of great height (high altitude).  Avoid heavy lifting.  Wear low-heeled shoes. Sit and stand up straight.  You can continue to have sex unless your doctor tells you not to. Relieving pain and discomfort  Wear a good support bra if your breasts are tender.  Take warm water baths (sitz baths) to soothe pain or discomfort caused by hemorrhoids. Use hemorrhoid cream if your doctor approves.  Rest with your legs raised if you have leg cramps or low back pain.  If you develop puffy, bulging veins (varicose veins) in your legs: ? Wear support hose or compression stockings as told by your doctor. ? Raise (elevate) your feet for 15 minutes, 3-4 times a day. ? Limit salt in your food. Prenatal care  Write down your questions. Take them to your prenatal visits.  Keep all your prenatal visits as told by your doctor. This is important. Safety  Wear your seat belt when driving.  Make a list of emergency phone numbers, including numbers for family, friends,   the hospital, and police and fire departments. General instructions  Ask your doctor about the right foods to eat or for help finding a counselor, if you need these services.  Ask your doctor about local prenatal classes. Begin classes before month 6 of your pregnancy.  Do not use hot tubs, steam rooms, or saunas.  Do not douche or use tampons or scented sanitary pads.  Do not cross your legs for long periods of time.  Visit your dentist if you have not done so. Use a soft toothbrush  to brush your teeth. Floss gently.  Avoid all smoking, herbs, and alcohol. Avoid drugs that are not approved by your doctor.  Do not use any products that contain nicotine or tobacco, such as cigarettes and e-cigarettes. If you need help quitting, ask your doctor.  Avoid cat litter boxes and soil used by cats. These carry germs that can cause birth defects in the baby and can cause a loss of your baby (miscarriage) or stillbirth. Contact a doctor if:  You have mild cramps or pressure in your lower belly.  You have pain when you pee (urinate).  You have bad smelling fluid coming from your vagina.  You continue to feel sick to your stomach (nauseous), throw up (vomit), or have watery poop (diarrhea).  You have a nagging pain in your belly area.  You feel dizzy. Get help right away if:  You have a fever.  You are leaking fluid from your vagina.  You have spotting or bleeding from your vagina.  You have severe belly cramping or pain.  You lose or gain weight rapidly.  You have trouble catching your breath and have chest pain.  You notice sudden or extreme puffiness (swelling) of your face, hands, ankles, feet, or legs.  You have not felt the baby move in over an hour.  You have severe headaches that do not go away when you take medicine.  You have trouble seeing. Summary  The second trimester is from week 14 through week 27 (months 4 through 6). This is often the time in pregnancy that you feel your best.  To take care of yourself and your unborn baby, you will need to eat healthy meals, take medicines only if your doctor tells you to do so, and do activities that are safe for you and your baby.  Call your doctor if you get sick or if you notice anything unusual about your pregnancy. Also, call your doctor if you need help with the right food to eat, or if you want to know what activities are safe for you. This information is not intended to replace advice given to you by  your health care provider. Make sure you discuss any questions you have with your health care provider. Document Revised: 08/10/2018 Document Reviewed: 05/24/2016 Elsevier Patient Education  2020 [employer]  

## 2019-12-28 NOTE — Progress Notes (Signed)
ROB-Doing well, no concerns. Discussed waterbirth policy, understands that she is not a candidate as a VBAC. Anticipatory guidance regarding course of prenatal care. Reviewed red flag symptoms and when to call. RTC x 4 weeks for 28 week labs, TDaP, and ROB or sooner if needed.

## 2020-01-22 ENCOUNTER — Other Ambulatory Visit: Payer: BLUE CROSS/BLUE SHIELD

## 2020-01-22 ENCOUNTER — Ambulatory Visit (INDEPENDENT_AMBULATORY_CARE_PROVIDER_SITE_OTHER): Payer: BLUE CROSS/BLUE SHIELD | Admitting: Certified Nurse Midwife

## 2020-01-22 ENCOUNTER — Encounter: Payer: Self-pay | Admitting: Certified Nurse Midwife

## 2020-01-22 ENCOUNTER — Other Ambulatory Visit: Payer: Self-pay

## 2020-01-22 VITALS — BP 114/78 | HR 102 | Wt 188.4 lb

## 2020-01-22 DIAGNOSIS — Z23 Encounter for immunization: Secondary | ICD-10-CM | POA: Diagnosis not present

## 2020-01-22 DIAGNOSIS — Z3A28 28 weeks gestation of pregnancy: Secondary | ICD-10-CM

## 2020-01-22 LAB — POCT URINALYSIS DIPSTICK OB
Bilirubin, UA: NEGATIVE
Blood, UA: NEGATIVE
Glucose, UA: NEGATIVE
Ketones, UA: NEGATIVE
Leukocytes, UA: NEGATIVE
Nitrite, UA: NEGATIVE
POC,PROTEIN,UA: NEGATIVE
Spec Grav, UA: 1.01 (ref 1.010–1.025)
Urobilinogen, UA: 0.2 E.U./dL
pH, UA: 6 (ref 5.0–8.0)

## 2020-01-22 MED ORDER — TETANUS-DIPHTH-ACELL PERTUSSIS 5-2.5-18.5 LF-MCG/0.5 IM SUSP
0.5000 mL | Freq: Once | INTRAMUSCULAR | Status: AC
  Administered 2020-01-22: 0.5 mL via INTRAMUSCULAR

## 2020-01-22 NOTE — Patient Instructions (Signed)
Glucose Tolerance Test During Pregnancy Why am I having this test? The glucose tolerance test (GTT) is done to check how your body processes sugar (glucose). This is one of several tests used to diagnose diabetes that develops during pregnancy (gestational diabetes mellitus). Gestational diabetes is a temporary form of diabetes that some women develop during pregnancy. It usually occurs during the second trimester of pregnancy and goes away after delivery. Testing (screening) for gestational diabetes usually occurs between 24 and 28 weeks of pregnancy. You may have the GTT test after having a 1-hour glucose screening test if the results from that test indicate that you may have gestational diabetes. You may also have this test if:  You have a history of gestational diabetes.  You have a history of giving birth to very large babies or have experienced repeated fetal loss (stillbirth).  You have signs and symptoms of diabetes, such as: ? Changes in your vision. ? Tingling or numbness in your hands or feet. ? Changes in hunger, thirst, and urination that are not otherwise explained by your pregnancy. What is being tested? This test measures the amount of glucose in your blood at different times during a period of 3 hours. This indicates how well your body is able to process glucose. What kind of sample is taken?  Blood samples are required for this test. They are usually collected by inserting a needle into a blood vessel. How do I prepare for this test?  For 3 days before your test, eat normally. Have plenty of carbohydrate-rich foods.  Follow instructions from your health care provider about: ? Eating or drinking restrictions on the day of the test. You may be asked to not eat or drink anything other than water (fast) starting 8-10 hours before the test. ? Changing or stopping your regular medicines. Some medicines may interfere with this test. Tell a health care provider about:  All  medicines you are taking, including vitamins, herbs, eye drops, creams, and over-the-counter medicines.  Any blood disorders you have.  Any surgeries you have had.  Any medical conditions you have. What happens during the test? First, your blood glucose will be measured. This is referred to as your fasting blood glucose, since you fasted before the test. Then, you will drink a glucose solution that contains a certain amount of glucose. Your blood glucose will be measured again 1, 2, and 3 hours after drinking the solution. This test takes about 3 hours to complete. You will need to stay at the testing location during this time. During the testing period:  Do not eat or drink anything other than the glucose solution.  Do not exercise.  Do not use any products that contain nicotine or tobacco, such as cigarettes and e-cigarettes. If you need help stopping, ask your health care provider. The testing procedure may vary among health care providers and hospitals. How are the results reported? Your results will be reported as milligrams of glucose per deciliter of blood (mg/dL) or millimoles per liter (mmol/L). Your health care provider will compare your results to normal ranges that were established after testing a large group of people (reference ranges). Reference ranges may vary among labs and hospitals. For this test, common reference ranges are:  Fasting: less than 95-105 mg/dL (5.3-5.8 mmol/L).  1 hour after drinking glucose: less than 180-190 mg/dL (10.0-10.5 mmol/L).  2 hours after drinking glucose: less than 155-165 mg/dL (8.6-9.2 mmol/L).  3 hours after drinking glucose: 140-145 mg/dL (7.8-8.1 mmol/L). What do the   results mean? Results within reference ranges are considered normal, meaning that your glucose levels are well-controlled. If two or more of your blood glucose levels are high, you may be diagnosed with gestational diabetes. If only one level is high, your health care  provider may suggest repeat testing or other tests to confirm a diagnosis. Talk with your health care provider about what your results mean. Questions to ask your health care provider Ask your health care provider, or the department that is doing the test:  When will my results be ready?  How will I get my results?  What are my treatment options?  What other tests do I need?  What are my next steps? Summary  The glucose tolerance test (GTT) is one of several tests used to diagnose diabetes that develops during pregnancy (gestational diabetes mellitus). Gestational diabetes is a temporary form of diabetes that some women develop during pregnancy.  You may have the GTT test after having a 1-hour glucose screening test if the results from that test indicate that you may have gestational diabetes. You may also have this test if you have any symptoms or risk factors for gestational diabetes.  Talk with your health care provider about what your results mean. This information is not intended to replace advice given to you by your health care provider. Make sure you discuss any questions you have with your health care provider. Document Revised: 08/09/2018 Document Reviewed: 11/28/2016 Elsevier Patient Education  2020 [employer]  

## 2020-01-22 NOTE — Progress Notes (Signed)
ROB doing well. Feels good movement. Glucose screen/RPR/CBC/Tdap today. Pt partner has had a vasectomy already. Plans breast feeding-RSB completed see check list. Birth plan reviewed. Plans to wait and see for medications but is going to try for unmediated birth . Follow up 2 wks with Marcelino Duster.   Doreene Burke, CNM

## 2020-01-23 LAB — CBC
Hematocrit: 33.4 % — ABNORMAL LOW (ref 34.0–46.6)
Hemoglobin: 11.3 g/dL (ref 11.1–15.9)
MCH: 32.5 pg (ref 26.6–33.0)
MCHC: 33.8 g/dL (ref 31.5–35.7)
MCV: 96 fL (ref 79–97)
Platelets: 136 10*3/uL — ABNORMAL LOW (ref 150–450)
RBC: 3.48 x10E6/uL — ABNORMAL LOW (ref 3.77–5.28)
RDW: 12.4 % (ref 11.7–15.4)
WBC: 8 10*3/uL (ref 3.4–10.8)

## 2020-01-23 LAB — RPR: RPR Ser Ql: NONREACTIVE

## 2020-01-23 LAB — GLUCOSE, 1 HOUR GESTATIONAL: Gestational Diabetes Screen: 110 mg/dL (ref 65–139)

## 2020-02-07 ENCOUNTER — Ambulatory Visit (INDEPENDENT_AMBULATORY_CARE_PROVIDER_SITE_OTHER): Payer: BLUE CROSS/BLUE SHIELD | Admitting: Certified Nurse Midwife

## 2020-02-07 ENCOUNTER — Other Ambulatory Visit: Payer: Self-pay

## 2020-02-07 ENCOUNTER — Encounter: Payer: Self-pay | Admitting: Certified Nurse Midwife

## 2020-02-07 VITALS — BP 111/80 | HR 100 | Wt 191.7 lb

## 2020-02-07 DIAGNOSIS — Z3483 Encounter for supervision of other normal pregnancy, third trimester: Secondary | ICD-10-CM

## 2020-02-07 DIAGNOSIS — Z3A30 30 weeks gestation of pregnancy: Secondary | ICD-10-CM

## 2020-02-07 LAB — POCT URINALYSIS DIPSTICK OB
Bilirubin, UA: NEGATIVE
Blood, UA: NEGATIVE
Glucose, UA: NEGATIVE
Ketones, UA: NEGATIVE
Leukocytes, UA: NEGATIVE
Nitrite, UA: NEGATIVE
POC,PROTEIN,UA: NEGATIVE
Spec Grav, UA: 1.01 (ref 1.010–1.025)
Urobilinogen, UA: 0.2 E.U./dL
pH, UA: 7 (ref 5.0–8.0)

## 2020-02-07 NOTE — Patient Instructions (Signed)
Third Trimester of Pregnancy  The third trimester is from week 28 through week 40 (months 7 through 9). This trimester is when your unborn baby (fetus) is growing very fast. At the end of the ninth month, the unborn baby is about 20 inches in length. It weighs about 6-10 pounds. Follow these instructions at home: Medicines  Take over-the-counter and prescription medicines only as told by your doctor. Some medicines are safe and some medicines are not safe during pregnancy.  Take a prenatal vitamin that contains at least 600 micrograms (mcg) of folic acid.  If you have trouble pooping (constipation), take medicine that will make your stool soft (stool softener) if your doctor approves. Eating and drinking   Eat regular, healthy meals.  Avoid raw meat and uncooked cheese.  If you get low calcium from the food you eat, talk to your doctor about taking a daily calcium supplement.  Eat four or five small meals rather than three large meals a day.  Avoid foods that are high in fat and sugars, such as fried and sweet foods.  To prevent constipation: ? Eat foods that are high in fiber, like fresh fruits and vegetables, whole grains, and beans. ? Drink enough fluids to keep your pee (urine) clear or pale yellow. Activity  Exercise only as told by your doctor. Stop exercising if you start to have cramps.  Avoid heavy lifting, wear low heels, and sit up straight.  Do not exercise if it is too hot, too humid, or if you are in a place of great height (high altitude).  You may continue to have sex unless your doctor tells you not to. Relieving pain and discomfort  Wear a good support bra if your breasts are tender.  Take frequent breaks and rest with your legs raised if you have leg cramps or low back pain.  Take warm water baths (sitz baths) to soothe pain or discomfort caused by hemorrhoids. Use hemorrhoid cream if your doctor approves.  If you develop puffy, bulging veins (varicose  veins) in your legs: ? Wear support hose or compression stockings as told by your doctor. ? Raise (elevate) your feet for 15 minutes, 3-4 times a day. ? Limit salt in your food. Safety  Wear your seat belt when driving.  Make a list of emergency phone numbers, including numbers for family, friends, the hospital, and police and fire departments. Preparing for your baby's arrival To prepare for the arrival of your baby:  Take prenatal classes.  Practice driving to the hospital.  Visit the hospital and tour the maternity area.  Talk to your work about taking leave once the baby comes.  Pack your hospital bag.  Prepare the baby's room.  Go to your doctor visits.  Buy a rear-facing car seat. Learn how to install it in your car. General instructions  Do not use hot tubs, steam rooms, or saunas.  Do not use any products that contain nicotine or tobacco, such as cigarettes and e-cigarettes. If you need help quitting, ask your doctor.  Do not drink alcohol.  Do not douche or use tampons or scented sanitary pads.  Do not cross your legs for long periods of time.  Do not travel for long distances unless you must. Only do so if your doctor says it is okay.  Visit your dentist if you have not gone during your pregnancy. Use a soft toothbrush to brush your teeth. Be gentle when you floss.  Avoid cat litter boxes and soil   used by cats. These carry germs that can cause birth defects in the baby and can cause a loss of your baby (miscarriage) or stillbirth.  Keep all your prenatal visits as told by your doctor. This is important. Contact a doctor if:  You are not sure if you are in labor or if your water has broken.  You are dizzy.  You have mild cramps or pressure in your lower belly.  You have a nagging pain in your belly area.  You continue to feel sick to your stomach, you throw up, or you have watery poop.  You have bad smelling fluid coming from your vagina.  You have  pain when you pee. Get help right away if:  You have a fever.  You are leaking fluid from your vagina.  You are spotting or bleeding from your vagina.  You have severe belly cramps or pain.  You lose or gain weight quickly.  You have trouble catching your breath and have chest pain.  You notice sudden or extreme puffiness (swelling) of your face, hands, ankles, feet, or legs.  You have not felt the baby move in over an hour.  You have severe headaches that do not go away with medicine.  You have trouble seeing.  You are leaking, or you are having a gush of fluid, from your vagina before you are 37 weeks.  You have regular belly spasms (contractions) before you are 37 weeks. Summary  The third trimester is from week 28 through week 40 (months 7 through 9). This time is when your unborn baby is growing very fast.  Follow your doctor's advice about medicine, food, and activity.  Get ready for the arrival of your baby by taking prenatal classes, getting all the baby items ready, preparing the baby's room, and visiting your doctor to be checked.  Get help right away if you are bleeding from your vagina, or you have chest pain and trouble catching your breath, or if you have not felt your baby move in over an hour. This information is not intended to replace advice given to you by your health care provider. Make sure you discuss any questions you have with your health care provider. Document Revised: 08/09/2018 Document Reviewed: 05/24/2016 Elsevier Patient Education  2020 [employer]   Fetal Movement Counts Patient Name: ________________________________________________ Patient Due Date: ____________________ What is a fetal movement count?  A fetal movement count is the number of times that you feel your baby move during a certain amount of time. This may also be called a fetal kick count. A fetal movement count is recommended for every pregnant woman. You may be asked to  start counting fetal movements as early as week 28 of your pregnancy. Pay attention to when your baby is most active. You may notice your baby's sleep and wake cycles. You may also notice things that make your baby move more. You should do a fetal movement count:  When your baby is normally most active.  At the same time each day. A good time to count movements is while you are resting, after having something to eat and drink. How do I count fetal movements? 1. Find a quiet, comfortable area. Sit, or lie down on your side. 2. Write down the date, the start time and stop time, and the number of movements that you felt between those two times. Take this information with you to your health care visits. 3. Write down your start time when you feel   the first movement. 4. Count kicks, flutters, swishes, rolls, and jabs. You should feel at least 10 movements. 5. You may stop counting after you have felt 10 movements, or if you have been counting for 2 hours. Write down the stop time. 6. If you do not feel 10 movements in 2 hours, contact your health care provider for further instructions. Your health care provider may want to do additional tests to assess your baby's well-being. Contact a health care provider if:  You feel fewer than 10 movements in 2 hours.  Your baby is not moving like he or she usually does. Date: ____________ Start time: ____________ Stop time: ____________ Movements: ____________ Date: ____________ Start time: ____________ Stop time: ____________ Movements: ____________ Date: ____________ Start time: ____________ Stop time: ____________ Movements: ____________ Date: ____________ Start time: ____________ Stop time: ____________ Movements: ____________ Date: ____________ Start time: ____________ Stop time: ____________ Movements: ____________ Date: ____________ Start time: ____________ Stop time: ____________ Movements: ____________ Date: ____________ Start time: ____________  Stop time: ____________ Movements: ____________ Date: ____________ Start time: ____________ Stop time: ____________ Movements: ____________ Date: ____________ Start time: ____________ Stop time: ____________ Movements: ____________ This information is not intended to replace advice given to you by your health care provider. Make sure you discuss any questions you have with your health care provider. Document Revised: 12/06/2018 Document Reviewed: 12/06/2018 Elsevier Patient Education  2020 [employer]  

## 2020-02-09 MED ORDER — PANTOPRAZOLE SODIUM 40 MG PO TBEC
40.0000 mg | DELAYED_RELEASE_TABLET | Freq: Two times a day (BID) | ORAL | 1 refills | Status: DC
Start: 2020-02-09 — End: 2020-05-11

## 2020-02-09 NOTE — Progress Notes (Signed)
ROB-Doing well except for increased reflux. Taking Protonix BID. Updated Rx sent, see chart. Anticipatory guidance regarding course of prenatal care. Reviewed red flag symptoms and when to call. RTC x 2 weeks for ROB or sooner if needed.

## 2020-02-18 ENCOUNTER — Other Ambulatory Visit: Payer: Self-pay

## 2020-02-18 ENCOUNTER — Ambulatory Visit (INDEPENDENT_AMBULATORY_CARE_PROVIDER_SITE_OTHER): Payer: BLUE CROSS/BLUE SHIELD | Admitting: Certified Nurse Midwife

## 2020-02-18 ENCOUNTER — Encounter: Payer: Self-pay | Admitting: Certified Nurse Midwife

## 2020-02-18 VITALS — BP 122/82 | HR 109 | Wt 196.1 lb

## 2020-02-18 DIAGNOSIS — Z3A32 32 weeks gestation of pregnancy: Secondary | ICD-10-CM

## 2020-02-18 LAB — POCT URINALYSIS DIPSTICK OB
Bilirubin, UA: NEGATIVE
Blood, UA: NEGATIVE
Glucose, UA: NEGATIVE
Ketones, UA: NEGATIVE
Leukocytes, UA: NEGATIVE
Nitrite, UA: NEGATIVE
POC,PROTEIN,UA: NEGATIVE
Spec Grav, UA: 1.015 (ref 1.010–1.025)
Urobilinogen, UA: 0.2 E.U./dL
pH, UA: 5 (ref 5.0–8.0)

## 2020-02-18 NOTE — Patient Instructions (Signed)

## 2020-02-18 NOTE — Progress Notes (Signed)
ROB doing well. Feels good fetal movement. Has not question or concerns this visit. Follow up 2 wks with Marcelino Duster.   Doreene Burke, CNM

## 2020-03-05 ENCOUNTER — Encounter: Payer: Self-pay | Admitting: Certified Nurse Midwife

## 2020-03-05 ENCOUNTER — Other Ambulatory Visit: Payer: Self-pay

## 2020-03-05 ENCOUNTER — Ambulatory Visit (INDEPENDENT_AMBULATORY_CARE_PROVIDER_SITE_OTHER): Payer: BLUE CROSS/BLUE SHIELD | Admitting: Certified Nurse Midwife

## 2020-03-05 VITALS — BP 120/84 | HR 108 | Wt 198.7 lb

## 2020-03-05 DIAGNOSIS — Z3483 Encounter for supervision of other normal pregnancy, third trimester: Secondary | ICD-10-CM

## 2020-03-05 DIAGNOSIS — Z3A34 34 weeks gestation of pregnancy: Secondary | ICD-10-CM

## 2020-03-05 LAB — POCT URINALYSIS DIPSTICK OB
Bilirubin, UA: NEGATIVE
Blood, UA: NEGATIVE
Glucose, UA: NEGATIVE
Ketones, UA: NEGATIVE
Leukocytes, UA: NEGATIVE
Nitrite, UA: NEGATIVE
POC,PROTEIN,UA: NEGATIVE
Spec Grav, UA: 1.015 (ref 1.010–1.025)
Urobilinogen, UA: 0.2 E.U./dL
pH, UA: 7 (ref 5.0–8.0)

## 2020-03-05 NOTE — Patient Instructions (Signed)
Fetal Movement Counts Patient Name: ________________________________________________ Patient Due Date: ____________________ What is a fetal movement count?  A fetal movement count is the number of times that you feel your baby move during a certain amount of time. This may also be called a fetal kick count. A fetal movement count is recommended for every pregnant woman. You may be asked to start counting fetal movements as early as week 28 of your pregnancy. Pay attention to when your baby is most active. You may notice your baby's sleep and wake cycles. You may also notice things that make your baby move more. You should do a fetal movement count:  When your baby is normally most active.  At the same time each day. A good time to count movements is while you are resting, after having something to eat and drink. How do I count fetal movements? 1. Find a quiet, comfortable area. Sit, or lie down on your side. 2. Write down the date, the start time and stop time, and the number of movements that you felt between those two times. Take this information with you to your health care visits. 3. Write down your start time when you feel the first movement. 4. Count kicks, flutters, swishes, rolls, and jabs. You should feel at least 10 movements. 5. You may stop counting after you have felt 10 movements, or if you have been counting for 2 hours. Write down the stop time. 6. If you do not feel 10 movements in 2 hours, contact your health care provider for further instructions. Your health care provider may want to do additional tests to assess your baby's well-being. Contact a health care provider if:  You feel fewer than 10 movements in 2 hours.  Your baby is not moving like he or she usually does. Date: ____________ Start time: ____________ Stop time: ____________ Movements: ____________ Date: ____________ Start time: ____________ Stop time: ____________ Movements: ____________ Date: ____________  Start time: ____________ Stop time: ____________ Movements: ____________ Date: ____________ Start time: ____________ Stop time: ____________ Movements: ____________ Date: ____________ Start time: ____________ Stop time: ____________ Movements: ____________ Date: ____________ Start time: ____________ Stop time: ____________ Movements: ____________ Date: ____________ Start time: ____________ Stop time: ____________ Movements: ____________ Date: ____________ Start time: ____________ Stop time: ____________ Movements: ____________ Date: ____________ Start time: ____________ Stop time: ____________ Movements: ____________ This information is not intended to replace advice given to you by your health care provider. Make sure you discuss any questions you have with your health care provider. Document Revised: 12/06/2018 Document Reviewed: 12/06/2018 Elsevier Patient Education  2020 [employer]  

## 2020-03-05 NOTE — Progress Notes (Signed)
ROB-Doing well, no questions or concerns. Anticipatory guidance regarding course of prenatal care. GBS positive urine, however, will need culture for sensitivity due to penicillin allergy. Repeat CBC next visit, last platelet count 136. Reviewed red flag symptoms and when to call. RTC x 2 weeks cultures and ROB or sooner if needed.

## 2020-03-18 ENCOUNTER — Other Ambulatory Visit: Payer: Self-pay

## 2020-03-18 ENCOUNTER — Other Ambulatory Visit: Payer: BC Managed Care – PPO

## 2020-03-18 ENCOUNTER — Encounter: Payer: Self-pay | Admitting: Certified Nurse Midwife

## 2020-03-18 ENCOUNTER — Ambulatory Visit (INDEPENDENT_AMBULATORY_CARE_PROVIDER_SITE_OTHER): Payer: BC Managed Care – PPO | Admitting: Certified Nurse Midwife

## 2020-03-18 VITALS — BP 127/88 | HR 110 | Wt 202.2 lb

## 2020-03-18 DIAGNOSIS — Z3A36 36 weeks gestation of pregnancy: Secondary | ICD-10-CM

## 2020-03-18 LAB — POCT URINALYSIS DIPSTICK OB
Bilirubin, UA: NEGATIVE
Blood, UA: NEGATIVE
Glucose, UA: NEGATIVE
Ketones, UA: NEGATIVE
Leukocytes, UA: NEGATIVE
Nitrite, UA: NEGATIVE
POC,PROTEIN,UA: NEGATIVE
Spec Grav, UA: 1.02 (ref 1.010–1.025)
Urobilinogen, UA: 0.2 E.U./dL
pH, UA: 5 (ref 5.0–8.0)

## 2020-03-18 NOTE — Progress Notes (Signed)
Rob doing well. GBS and cultures today. SVE per pt request 2/50/-2. BP boarderline, she has some headaches that she treats with tylenol . She denies visual changes , epigastric pain. Has trace edema. Reflexes 1+ bilaterally no clonus. Pre eclampsia precautions reviewed. Labor precautions reviewed. Herbal prep handout given. Follow up 1 wk with Marcelino Duster.

## 2020-03-18 NOTE — Patient Instructions (Signed)
Group B Streptococcus Infection During Pregnancy °Group B Streptococcus (GBS) is a type of bacteria that is often found in healthy people. It is commonly found in the rectum, vagina, and intestines. In people who are healthy and not pregnant, the bacteria rarely cause serious illness or complications. However, women who test positive for GBS during pregnancy can pass the bacteria to the baby during childbirth. This can cause serious infection in the baby after birth. °Women with GBS may also have infections during their pregnancy or soon after childbirth. The infections include urinary tract infections (UTIs) or infections of the uterus. GBS also increases a woman's risk of complications during pregnancy, such as early labor or delivery, miscarriage, or stillbirth. Routine testing for GBS is recommended for all pregnant women. °What are the causes? °This condition is caused by bacteria called Streptococcus agalactiae. °What increases the risk? °You may have a higher risk for GBS infection during pregnancy if you had one during a past pregnancy. °What are the signs or symptoms? °In most cases, GBS infection does not cause symptoms in pregnant women. If symptoms exist, they may include: °· Labor that starts before the 37th week of pregnancy. °· A UTI or bladder infection. This may cause a fever, frequent urination, or pain and burning during urination. °· Fever during labor. There can also be a rapid heartbeat in the mother or baby. °Rare but serious symptoms of a GBS infection in women include: °· Blood infection (septicemia). This may cause fever, chills, or confusion. °· Lung infection (pneumonia). This may cause fever, chills, cough, rapid breathing, chest pain, or difficulty breathing. °· Bone, joint, skin, or soft tissue infection. °How is this diagnosed? °You may be screened for GBS between week 35 and week 37 of pregnancy. If you have symptoms of preterm labor, you may be screened earlier. This condition is  diagnosed based on lab test results from: °· A swab of fluid from the vagina and rectum. °· A urine sample. °How is this treated? °This condition is treated with antibiotic medicine. Antibiotic medicine may be given: °· To you when you go into labor, or as soon as your water breaks. The medicines will continue until after you give birth. If you are having a cesarean delivery, you do not need antibiotics unless your water has broken. °· To your baby, if he or she requires treatment. Your health care provider will check your baby to decide if he or she needs antibiotics to prevent a serious infection. °Follow these instructions at home: °· Take over-the-counter and prescription medicines only as told by your health care provider. °· Take your antibiotic medicine as told by your health care provider. Do not stop taking the antibiotic even if you start to feel better. °· Keep all pre-birth (prenatal) visits and follow-up visits as told by your health care provider. This is important. °Contact a health care provider if: °· You have pain or burning when you urinate. °· You have to urinate more often than usual. °· You have a fever or chills. °· You develop a bad-smelling vaginal discharge. °Get help right away if: °· Your water breaks. °· You go into labor. °· You have severe pain in your abdomen. °· You have difficulty breathing. °· You have chest pain. °These symptoms may represent a serious problem that is an emergency. Do not wait to see if the symptoms will go away. Get medical help right away. Call your local emergency services (911 in the [place] Do not drive yourself to   the hospital. °Summary °· GBS is a type of bacteria that is common in healthy people. °· During pregnancy, colonization with GBS can cause serious complications for you or your baby. °· Your health care provider will screen you between 35 and 37 weeks of pregnancy to determine if you are colonized with GBS. °· If you are colonized with GBS during  pregnancy, your health care provider will recommend antibiotics through an IV during labor. °· After delivery, your baby will be evaluated for complications related to potential GBS infection and may require antibiotics to prevent a serious infection. °This information is not intended to replace advice given to you by your health care provider. Make sure you discuss any questions you have with your health care provider. °Document Revised: 11/12/2018 Document Reviewed: 11/12/2018 °Elsevier Patient Education © 2020 [employer] Inc[employer] ° °

## 2020-03-20 LAB — STREP GP B NAA: Strep Gp B NAA: POSITIVE — AB

## 2020-03-21 LAB — GC/CHLAMYDIA PROBE AMP
Chlamydia trachomatis, NAA: NEGATIVE
Neisseria Gonorrhoeae by PCR: NEGATIVE

## 2020-03-23 ENCOUNTER — Other Ambulatory Visit: Payer: Self-pay

## 2020-03-23 ENCOUNTER — Encounter: Payer: Self-pay | Admitting: Certified Nurse Midwife

## 2020-03-23 ENCOUNTER — Ambulatory Visit (INDEPENDENT_AMBULATORY_CARE_PROVIDER_SITE_OTHER): Payer: BC Managed Care – PPO | Admitting: Certified Nurse Midwife

## 2020-03-23 VITALS — BP 128/92 | HR 103 | Wt 202.8 lb

## 2020-03-23 DIAGNOSIS — Z3483 Encounter for supervision of other normal pregnancy, third trimester: Secondary | ICD-10-CM

## 2020-03-23 DIAGNOSIS — Z3A37 37 weeks gestation of pregnancy: Secondary | ICD-10-CM

## 2020-03-23 LAB — POCT URINALYSIS DIPSTICK OB
Bilirubin, UA: NEGATIVE
Blood, UA: NEGATIVE
Glucose, UA: NEGATIVE
Ketones, UA: NEGATIVE
Leukocytes, UA: NEGATIVE
Nitrite, UA: NEGATIVE
POC,PROTEIN,UA: NEGATIVE
Spec Grav, UA: 1.01 (ref 1.010–1.025)
Urobilinogen, UA: 0.2 E.U./dL
pH, UA: 6.5 (ref 5.0–8.0)

## 2020-03-23 NOTE — Patient Instructions (Signed)
Preeclampsia and Eclampsia Preeclampsia is a serious condition that may develop during pregnancy. This condition causes high blood pressure and increased protein in your urine along with other symptoms, such as headaches and vision changes. These symptoms may develop as the condition gets worse. Preeclampsia may occur at 20 weeks of pregnancy or later. Diagnosing and treating preeclampsia early is very important. If not treated early, it can cause serious problems for you and your baby. One problem it can lead to is eclampsia. Eclampsia is a condition that causes muscle jerking or shaking (convulsions or seizures) and other serious problems for the mother. During pregnancy, delivering your baby may be the best treatment for preeclampsia or eclampsia. For most women, preeclampsia and eclampsia symptoms go away after giving birth. In rare cases, a woman may develop preeclampsia after giving birth (postpartum preeclampsia). This usually occurs within 48 hours after childbirth but may occur up to 6 weeks after giving birth. What are the causes? The cause of preeclampsia is not known. What increases the risk? The following risk factors make you more likely to develop preeclampsia:  Being pregnant for the first time.  Having had preeclampsia during a past pregnancy.  Having a family history of preeclampsia.  Having high blood pressure.  Being pregnant with more than one baby.  Being 35 or older.  Being African-American.  Having kidney disease or diabetes.  Having medical conditions such as lupus or blood diseases.  Being very overweight (obese). What are the signs or symptoms? The most common symptoms are:  Severe headaches.  Vision problems, such as blurred or double vision.  Abdominal pain, especially upper abdominal pain. Other symptoms that may develop as the condition gets worse include:  Sudden weight gain.  Sudden swelling of the hands, face, legs, and feet.  Severe nausea  and vomiting.  Numbness in the face, arms, legs, and feet.  Dizziness.  Urinating less than usual.  Slurred speech.  Convulsions or seizures. How is this diagnosed? There are no screening tests for preeclampsia. Your health care provider will ask you about symptoms and check for signs of preeclampsia during your prenatal visits. You may also have tests that include:  Checking your blood pressure.  Urine tests to check for protein. Your health care provider will check for this at every prenatal visit.  Blood tests.  Monitoring your baby's heart rate.  Ultrasound. How is this treated? You and your health care provider will determine the treatment approach that is best for you. Treatment may include:  Having more frequent prenatal exams to check for signs of preeclampsia, if you have an increased risk for preeclampsia.  Medicine to lower your blood pressure.  Staying in the hospital, if your condition is severe. There, treatment will focus on controlling your blood pressure and the amount of fluids in your body (fluid retention).  Taking medicine (magnesium sulfate) to prevent seizures. This may be given as an injection or through an IV.  Taking a low-dose aspirin during your pregnancy.  Delivering your baby early. You may have your labor started with medicine (induced), or you may have a cesarean delivery. Follow these instructions at home: Eating and drinking   Drink enough fluid to keep your urine pale yellow.  Avoid caffeine. Lifestyle  Do not use any products that contain nicotine or tobacco, such as cigarettes and e-cigarettes. If you need help quitting, ask your health care provider.  Do not use alcohol or drugs.  Avoid stress as much as possible. Rest and get   plenty of sleep. General instructions  Take over-the-counter and prescription medicines only as told by your health care provider.  When lying down, lie on your left side. This keeps pressure off your  major blood vessels.  When sitting or lying down, raise (elevate) your feet. Try putting some pillows underneath your lower legs.  Exercise regularly. Ask your health care provider what kinds of exercise are best for you.  Keep all follow-up and prenatal visits as told by your health care provider. This is important. How is this prevented? There is no known way of preventing preeclampsia or eclampsia from developing. However, to lower your risk of complications and detect problems early:  Get regular prenatal care. Your health care provider may be able to diagnose and treat the condition early.  Maintain a healthy weight. Ask your health care provider for help managing weight gain during pregnancy.  Work with your health care provider to manage any long-term (chronic) health conditions you have, such as diabetes or kidney problems.  You may have tests of your blood pressure and kidney function after giving birth.  Your health care provider may have you take low-dose aspirin during your next pregnancy. Contact a health care provider if:  You have symptoms that your health care provider told you may require more treatment or monitoring, such as: ? Headaches. ? Nausea or vomiting. ? Abdominal pain. ? Dizziness. ? Light-headedness. Get help right away if:  You have severe: ? Abdominal pain. ? Headaches that do not get better. ? Dizziness. ? Vision problems. ? Confusion. ? Nausea or vomiting.  You have any of the following: ? A seizure. ? Sudden, rapid weight gain. ? Sudden swelling in your hands, ankles, or face. ? Trouble moving any part of your body. ? Numbness in any part of your body. ? Trouble speaking. ? Abnormal bleeding.  You faint. Summary  Preeclampsia is a serious condition that may develop during pregnancy.  This condition causes high blood pressure and increased protein in your urine along with other symptoms, such as headaches and vision  changes.  Diagnosing and treating preeclampsia early is very important. If not treated early, it can cause serious problems for you and your baby.  Get help right away if you have symptoms that your health care provider told you to watch for. This information is not intended to replace advice given to you by your health care provider. Make sure you discuss any questions you have with your health care provider. Document Revised: 12/19/2017 Document Reviewed: 11/23/2015 Elsevier Patient Education  2020 Elsevier Inc.   Fetal Movement Counts Patient Name: ________________________________________________ Patient Due Date: ____________________ What is a fetal movement count?  A fetal movement count is the number of times that you feel your baby move during a certain amount of time. This may also be called a fetal kick count. A fetal movement count is recommended for every pregnant woman. You may be asked to start counting fetal movements as early as week 28 of your pregnancy. Pay attention to when your baby is most active. You may notice your baby's sleep and wake cycles. You may also notice things that make your baby move more. You should do a fetal movement count:  When your baby is normally most active.  At the same time each day. A good time to count movements is while you are resting, after having something to eat and drink. How do I count fetal movements? 1. Find a quiet, comfortable area. Sit, or lie  down on your side. 2. Write down the date, the start time and stop time, and the number of movements that you felt between those two times. Take this information with you to your health care visits. 3. Write down your start time when you feel the first movement. 4. Count kicks, flutters, swishes, rolls, and jabs. You should feel at least 10 movements. 5. You may stop counting after you have felt 10 movements, or if you have been counting for 2 hours. Write down the stop time. 6. If you do  not feel 10 movements in 2 hours, contact your health care provider for further instructions. Your health care provider may want to do additional tests to assess your baby's well-being. Contact a health care provider if:  You feel fewer than 10 movements in 2 hours.  Your baby is not moving like he or she usually does. Date: ____________ Start time: ____________ Stop time: ____________ Movements: ____________ Date: ____________ Start time: ____________ Stop time: ____________ Movements: ____________ Date: ____________ Start time: ____________ Stop time: ____________ Movements: ____________ Date: ____________ Start time: ____________ Stop time: ____________ Movements: ____________ Date: ____________ Start time: ____________ Stop time: ____________ Movements: ____________ Date: ____________ Start time: ____________ Stop time: ____________ Movements: ____________ Date: ____________ Start time: ____________ Stop time: ____________ Movements: ____________ Date: ____________ Start time: ____________ Stop time: ____________ Movements: ____________ Date: ____________ Start time: ____________ Stop time: ____________ Movements: ____________ This information is not intended to replace advice given to you by your health care provider. Make sure you discuss any questions you have with your health care provider. Document Revised: 12/06/2018 Document Reviewed: 12/06/2018 Elsevier Patient Education  2020 [employer].

## 2020-03-23 NOTE — Progress Notes (Signed)
ROB- Reports headaches relieved with tylenol and lower leg swelling. Denies blurred vision and other symptoms of pre-eclampsia. Repeat platelet count today, see orders; will contact with results. Discussed GBS status awaiting sensitivity reflex. SVE by CNM unchanged since previous visit. Anitcipatory guidance regarding course of prenatal care. Reviewed red flag symptoms and when to call. RTC X 1 week for ROB or sooner if needed.

## 2020-03-23 NOTE — Progress Notes (Signed)
I have seen, interviewed, and examined the patient in conjunction with the [facility] [employer] [facility] and affirm the diagnosis and management plan.   Gunnar Bulla, CNM [facility], [facility] 03/23/20 3:00 PM

## 2020-03-24 LAB — PLATELET COUNT: Platelets: 145 10*3/uL — ABNORMAL LOW (ref 150–450)

## 2020-03-27 ENCOUNTER — Other Ambulatory Visit: Payer: Self-pay

## 2020-03-27 ENCOUNTER — Observation Stay
Admission: EM | Admit: 2020-03-27 | Discharge: 2020-03-27 | Disposition: A | Discharge status: Home / Self Care | Payer: BC Managed Care – PPO | Source: Home / Self Care | Admitting: Certified Nurse Midwife

## 2020-03-27 DIAGNOSIS — O26893 Other specified pregnancy related conditions, third trimester: Secondary | ICD-10-CM | POA: Insufficient documentation

## 2020-03-27 DIAGNOSIS — R519 Headache, unspecified: Secondary | ICD-10-CM | POA: Insufficient documentation

## 2020-03-27 DIAGNOSIS — Z3A37 37 weeks gestation of pregnancy: Secondary | ICD-10-CM | POA: Insufficient documentation

## 2020-03-27 LAB — COMPREHENSIVE METABOLIC PANEL
ALT: 11 U/L (ref 0–44)
AST: 15 U/L (ref 15–41)
Albumin: 3 g/dL — ABNORMAL LOW (ref 3.5–5.0)
Alkaline Phosphatase: 89 U/L (ref 38–126)
Anion gap: 8 (ref 5–15)
BUN: 8 mg/dL (ref 6–20)
CO2: 22 mmol/L (ref 22–32)
Calcium: 8.2 mg/dL — ABNORMAL LOW (ref 8.9–10.3)
Chloride: 107 mmol/L (ref 98–111)
Creatinine, Ser: 0.57 mg/dL (ref 0.44–1.00)
GFR, Estimated: >60 mL/min (ref >60)
Glucose, Bld: 91 mg/dL (ref 70–99)
Potassium: 4.1 mmol/L (ref 3.5–5.1)
Sodium: 137 mmol/L (ref 135–145)
Total Bilirubin: 0.4 mg/dL (ref 0.3–1.2)
Total Protein: 5.9 g/dL — ABNORMAL LOW (ref 6.5–8.1)

## 2020-03-27 LAB — CBC WITH DIFFERENTIAL/PLATELET
Abs Immature Granulocytes: 0.08 10*3/uL — ABNORMAL HIGH (ref 0.00–0.07)
Basophils Absolute: 0 10*3/uL (ref 0.0–0.1)
Basophils Relative: 0 %
Eosinophils Absolute: 0 10*3/uL (ref 0.0–0.5)
Eosinophils Relative: 1 %
HCT: 30.8 % — ABNORMAL LOW (ref 36.0–46.0)
Hemoglobin: 10.1 g/dL — ABNORMAL LOW (ref 12.0–15.0)
Immature Granulocytes: 1 %
Lymphocytes Relative: 20 %
Lymphs Abs: 1.4 10*3/uL (ref 0.7–4.0)
MCH: 29.7 pg (ref 26.0–34.0)
MCHC: 32.8 g/dL (ref 30.0–36.0)
MCV: 90.6 fL (ref 80.0–100.0)
Monocytes Absolute: 0.5 10*3/uL (ref 0.1–1.0)
Monocytes Relative: 7 %
Neutro Abs: 5.1 10*3/uL (ref 1.7–7.7)
Neutrophils Relative %: 71 %
Platelets: 141 10*3/uL — ABNORMAL LOW (ref 150–400)
RBC: 3.4 MIL/uL — ABNORMAL LOW (ref 3.87–5.11)
RDW: 13.3 % (ref 11.5–15.5)
WBC: 7.2 10*3/uL (ref 4.0–10.5)
nRBC: 0 % (ref 0.0–0.2)

## 2020-03-27 LAB — URINALYSIS, COMPLETE (UACMP) WITH MICROSCOPIC
Bilirubin Urine: NEGATIVE
Glucose, UA: NEGATIVE mg/dL
Hgb urine dipstick: NEGATIVE
Ketones, ur: NEGATIVE mg/dL
Nitrite: NEGATIVE
Protein, ur: NEGATIVE mg/dL
Specific Gravity, Urine: 1.023 (ref 1.005–1.030)
pH: 6 (ref 5.0–8.0)

## 2020-03-27 LAB — PROTEIN / CREATININE RATIO, URINE
Creatinine, Urine: 191 mg/dL
Protein Creatinine Ratio: 0.14 mg/mg{Cre} (ref 0.00–0.15)
Total Protein, Urine: 27 mg/dL

## 2020-03-27 MED ORDER — BUTALBITAL-APAP-CAFFEINE 50-325-40 MG PO TABS
1.0000 | ORAL_TABLET | Freq: Once | ORAL | Status: AC
  Administered 2020-03-27: 1 via ORAL
  Filled 2020-03-27: qty 1

## 2020-03-27 MED ORDER — BUTALBITAL-APAP-CAFFEINE 50-325-40 MG PO TABS: 1.0000 | ORAL_TABLET | Freq: Once | ORAL | 0 refills | Status: AC

## 2020-03-27 NOTE — OB Triage Note (Signed)
Pt arrives G5P4 with c/o HTN since last night with home BP 150/100. Pt states that she has no fluid leaking or bloody show at this time. Pt states that she took tylenol at 1630 1000mg  for a HA x 2 days. Pt does report some contractions but states that none have been regular.

## 2020-03-27 NOTE — OB Triage Note (Signed)
    L&D OB Triage Note  SUBJECTIVE Paige Brown is a 34 y.o. [id number] female at [redacted]w[redacted]d, EDD Estimated Date of Delivery: 04/12/20 who presented to triage with complaints of headache x 2 days and elevated BP since last night that she has been taking at home. She denies leaking of fluid, vaginal bleeding, has irregular mild ctx and is feeling good fetal movement. She denies visual changes, and swelling.When asking about epigastric pain she states she has had stomach pain , due to nausea and vomiting.   OB History  Gravida Para Term Preterm AB Living  5 4 4  0 0 4  SAB TAB Ectopic Multiple Live Births  0 0 0 0 4    # Outcome Date GA Lbr Len/2nd Weight Sex Delivery Anes PTL Lv  5 Current           4 Term 03/16/18 [redacted]w[redacted]d 20:15 / 00:06 3650 g M VBAC None  LIV     Name: Dimitroff,BOY Kami     Apgar1: 9  Apgar5: 9  3 Term 06/11/16 [redacted]w[redacted]d / 00:41 3118 g M Vag-Spont None  LIV     Name: Catapano,BOY Kierre     Apgar1: 8  Apgar5: 9  2 Term 09/11/14 [redacted]w[redacted]d  3771 g M   Y LIV  1 Term 10/14/11 [redacted]w[redacted]d  3459 g F CS-Unspec  N LIV    Obstetric Comments  1st pregnancy-breech  2nd pregnancy: PTL at 35 wks./ VBAC- no assistance    Medications Prior to Admission  Medication Sig Dispense Refill Last Dose  . pantoprazole (PROTONIX) 40 MG tablet Take 1 tablet (40 mg total) by mouth 2 (two) times daily. 60 tablet 1 03/27/2020 at Unknown time  . Prenatal Vit-Fe Fumarate-FA (PRENATAL MULTIVITAMIN) TABS tablet Take 1 tablet by mouth daily at 12 noon.   03/27/2020 at Unknown time  . famotidine (PEPCID) 20 MG tablet Take 1 tablet (20 mg total) by mouth 2 (two) times daily. 60 tablet 3      OBJECTIVE  Nursing Evaluation:   BP 131/88   Pulse 94   Temp 98.2 F (36.8 C) (Oral)   Resp 18   Ht 5\' 5"  (1.651 m)   Wt 90.7 kg   LMP 07/22/2019   BMI 33.28 kg/m    Findings:   Reactive NST, Labs negative for pre eclampsia.       NST was performed and has been reviewed by me. I was present and examined the  patient in person.   NST INTERPRETATION: Category I  Baseline: 130's Moderate variability Accelerations present Decelerations absent  Ctx: none seen    SVE per pt request 3/70/-2 posterior, firm Reflexes: 2+ bilaterally  Clonus: negative bilaterally   ASSESSMENT Impression:  1.  Pregnancy:  at [redacted]w[redacted]d , EDD Estimated Date of Delivery: 04/12/20 2.  Reassuring fetal and maternal status 3.  Discussed gestational hypertension and recommendations for delivery 39 wks 4. Reviewed pre eclampsia signs and symptoms.    PLAN 1. Discussed current condition and above findings with patient and reassurance given.  All questions answered. 2. Discharge home with standard labor precautions given to return to L&D or call the office for problems. 3. Continue routine prenatal care as scheduled with me on Tuesday.     14/12/21, CNM

## 2020-03-30 ENCOUNTER — Encounter: Payer: Self-pay | Admitting: Obstetrics and Gynecology

## 2020-03-30 ENCOUNTER — Inpatient Hospital Stay
Admission: EM | Admit: 2020-03-30 | Discharge: 2020-03-31 | DRG: 807 | Disposition: A | Discharge status: Home / Self Care | Payer: BC Managed Care – PPO | Attending: Certified Nurse Midwife | Admitting: Certified Nurse Midwife

## 2020-03-30 ENCOUNTER — Other Ambulatory Visit: Payer: Self-pay

## 2020-03-30 DIAGNOSIS — O0940 Supervision of pregnancy with grand multiparity, unspecified trimester: Secondary | ICD-10-CM

## 2020-03-30 DIAGNOSIS — O99824 Streptococcus B carrier state complicating childbirth: Secondary | ICD-10-CM | POA: Diagnosis present

## 2020-03-30 DIAGNOSIS — Z3A38 38 weeks gestation of pregnancy: Secondary | ICD-10-CM | POA: Diagnosis not present

## 2020-03-30 DIAGNOSIS — Z20822 Contact with and (suspected) exposure to covid-19: Secondary | ICD-10-CM | POA: Diagnosis present

## 2020-03-30 DIAGNOSIS — O9962 Diseases of the digestive system complicating childbirth: Secondary | ICD-10-CM | POA: Diagnosis present

## 2020-03-30 DIAGNOSIS — O4292 Full-term premature rupture of membranes, unspecified as to length of time between rupture and onset of labor: Secondary | ICD-10-CM | POA: Diagnosis present

## 2020-03-30 DIAGNOSIS — Z98891 History of uterine scar from previous surgery: Secondary | ICD-10-CM

## 2020-03-30 DIAGNOSIS — O4202 Full-term premature rupture of membranes, onset of labor within 24 hours of rupture: Secondary | ICD-10-CM | POA: Diagnosis not present

## 2020-03-30 DIAGNOSIS — K219 Gastro-esophageal reflux disease without esophagitis: Secondary | ICD-10-CM | POA: Diagnosis present

## 2020-03-30 DIAGNOSIS — O134 Gestational [pregnancy-induced] hypertension without significant proteinuria, complicating childbirth: Secondary | ICD-10-CM | POA: Diagnosis present

## 2020-03-30 DIAGNOSIS — O34219 Maternal care for unspecified type scar from previous cesarean delivery: Secondary | ICD-10-CM | POA: Diagnosis present

## 2020-03-30 DIAGNOSIS — R8271 Bacteriuria: Secondary | ICD-10-CM | POA: Diagnosis present

## 2020-03-30 DIAGNOSIS — O139 Gestational [pregnancy-induced] hypertension without significant proteinuria, unspecified trimester: Secondary | ICD-10-CM

## 2020-03-30 DIAGNOSIS — O26893 Other specified pregnancy related conditions, third trimester: Secondary | ICD-10-CM | POA: Diagnosis present

## 2020-03-30 LAB — COMPREHENSIVE METABOLIC PANEL
ALT: 11 U/L (ref 0–44)
AST: 16 U/L (ref 15–41)
Albumin: 3.2 g/dL — ABNORMAL LOW (ref 3.5–5.0)
Alkaline Phosphatase: 96 U/L (ref 38–126)
Anion gap: 9 (ref 5–15)
BUN: 6 mg/dL (ref 6–20)
CO2: 19 mmol/L — ABNORMAL LOW (ref 22–32)
Calcium: 8.4 mg/dL — ABNORMAL LOW (ref 8.9–10.3)
Chloride: 109 mmol/L (ref 98–111)
Creatinine, Ser: 0.65 mg/dL (ref 0.44–1.00)
GFR, Estimated: >60 mL/min (ref >60)
Glucose, Bld: 88 mg/dL (ref 70–99)
Potassium: 3.7 mmol/L (ref 3.5–5.1)
Sodium: 137 mmol/L (ref 135–145)
Total Bilirubin: 0.5 mg/dL (ref 0.3–1.2)
Total Protein: 6.3 g/dL — ABNORMAL LOW (ref 6.5–8.1)

## 2020-03-30 LAB — CBC
HCT: 33.3 % — ABNORMAL LOW (ref 36.0–46.0)
Hemoglobin: 11.2 g/dL — ABNORMAL LOW (ref 12.0–15.0)
MCH: 29.7 pg (ref 26.0–34.0)
MCHC: 33.6 g/dL (ref 30.0–36.0)
MCV: 88.3 fL (ref 80.0–100.0)
Platelets: 144 10*3/uL — ABNORMAL LOW (ref 150–400)
RBC: 3.77 MIL/uL — ABNORMAL LOW (ref 3.87–5.11)
RDW: 13.4 % (ref 11.5–15.5)
WBC: 8.2 10*3/uL (ref 4.0–10.5)
nRBC: 0 % (ref 0.0–0.2)

## 2020-03-30 LAB — RESP PANEL BY RT-PCR (FLU A&B, COVID) ARPGX2
Influenza A by PCR: NEGATIVE
Influenza B by PCR: NEGATIVE
SARS Coronavirus 2 by RT PCR: NEGATIVE

## 2020-03-30 LAB — TYPE AND SCREEN
ABO/RH(D): O POS
Antibody Screen: NEGATIVE

## 2020-03-30 MED ORDER — SIMETHICONE 80 MG PO CHEW: 80.0000 mg | CHEWABLE_TABLET | ORAL | Status: DC | PRN

## 2020-03-30 MED ORDER — SENNOSIDES-DOCUSATE SODIUM 8.6-50 MG PO TABS: 2.0000 | ORAL_TABLET | ORAL | Status: DC

## 2020-03-30 MED ORDER — TERBUTALINE SULFATE 1 MG/ML IJ SOLN: 0.2500 mg | Freq: Once | INTRAMUSCULAR | Status: DC | PRN

## 2020-03-30 MED ORDER — COCONUT OIL OIL: 1.0000 "application " | TOPICAL_OIL | Status: DC | PRN

## 2020-03-30 MED ORDER — IBUPROFEN 600 MG PO TABS
600.0000 mg | ORAL_TABLET | Freq: Four times a day (QID) | ORAL | Status: DC
  Administered 2020-03-30 – 2020-03-31 (×6): 600 mg via ORAL
  Filled 2020-03-30 (×6): qty 1

## 2020-03-30 MED ORDER — SODIUM CHLORIDE 0.9 % IV SOLN: 250.0000 mL | INTRAVENOUS | Status: DC | PRN

## 2020-03-30 MED ORDER — ACETAMINOPHEN 325 MG PO TABS
650.0000 mg | ORAL_TABLET | ORAL | Status: DC | PRN
  Administered 2020-03-30 – 2020-03-31 (×2): 650 mg via ORAL
  Filled 2020-03-30 (×2): qty 2

## 2020-03-30 MED ORDER — OXYTOCIN 10 UNIT/ML IJ SOLN
INTRAMUSCULAR | Status: AC
  Filled 2020-03-30: qty 2

## 2020-03-30 MED ORDER — WITCH HAZEL-GLYCERIN EX PADS: 1.0000 "application " | MEDICATED_PAD | CUTANEOUS | Status: DC | PRN

## 2020-03-30 MED ORDER — LIDOCAINE HCL (PF) 1 % IJ SOLN
INTRAMUSCULAR | Status: AC
  Filled 2020-03-30: qty 30

## 2020-03-30 MED ORDER — DIPHENHYDRAMINE HCL 25 MG PO CAPS: 25.0000 mg | ORAL_CAPSULE | Freq: Four times a day (QID) | ORAL | Status: DC | PRN

## 2020-03-30 MED ORDER — LIDOCAINE HCL (PF) 1 % IJ SOLN: 30.0000 mL | INTRAMUSCULAR | Status: DC | PRN

## 2020-03-30 MED ORDER — OXYTOCIN BOLUS FROM INFUSION
333.0000 mL | Freq: Once | INTRAVENOUS | Status: AC
  Administered 2020-03-30: 333 mL via INTRAVENOUS

## 2020-03-30 MED ORDER — ACETAMINOPHEN 325 MG PO TABS: 650.0000 mg | ORAL_TABLET | ORAL | Status: DC | PRN

## 2020-03-30 MED ORDER — OXYTOCIN-SODIUM CHLORIDE 30-0.9 UT/500ML-% IV SOLN: 2.5000 [IU]/h | INTRAVENOUS | Status: DC

## 2020-03-30 MED ORDER — SODIUM CHLORIDE 0.9% FLUSH: 3.0000 mL | Freq: Two times a day (BID) | INTRAVENOUS | Status: DC

## 2020-03-30 MED ORDER — OXYCODONE-ACETAMINOPHEN 5-325 MG PO TABS: 2.0000 | ORAL_TABLET | ORAL | Status: DC | PRN

## 2020-03-30 MED ORDER — LACTATED RINGERS IV SOLN: 500.0000 mL | INTRAVENOUS | Status: DC | PRN

## 2020-03-30 MED ORDER — SOD CITRATE-CITRIC ACID 500-334 MG/5ML PO SOLN: 30.0000 mL | ORAL | Status: DC | PRN

## 2020-03-30 MED ORDER — CLINDAMYCIN PHOSPHATE 900 MG/50ML IV SOLN
INTRAVENOUS | Status: AC
  Administered 2020-03-30: 900 mg via INTRAVENOUS
  Filled 2020-03-30: qty 50

## 2020-03-30 MED ORDER — AMMONIA AROMATIC IN INHA
RESPIRATORY_TRACT | Status: AC
  Filled 2020-03-30: qty 10

## 2020-03-30 MED ORDER — DIBUCAINE (PERIANAL) 1 % EX OINT: 1.0000 "application " | TOPICAL_OINTMENT | CUTANEOUS | Status: DC | PRN

## 2020-03-30 MED ORDER — ZOLPIDEM TARTRATE 5 MG PO TABS: 5.0000 mg | ORAL_TABLET | Freq: Every evening | ORAL | Status: DC | PRN

## 2020-03-30 MED ORDER — SODIUM CHLORIDE 0.9% FLUSH: 3.0000 mL | INTRAVENOUS | Status: DC | PRN

## 2020-03-30 MED ORDER — ONDANSETRON HCL 4 MG/2ML IJ SOLN: 4.0000 mg | INTRAMUSCULAR | Status: DC | PRN

## 2020-03-30 MED ORDER — CLINDAMYCIN PHOSPHATE 900 MG/50ML IV SOLN
900.0000 mg | Freq: Three times a day (TID) | INTRAVENOUS | Status: DC
  Filled 2020-03-30 (×2): qty 50

## 2020-03-30 MED ORDER — OXYCODONE HCL 5 MG PO TABS: 5.0000 mg | ORAL_TABLET | ORAL | Status: DC | PRN

## 2020-03-30 MED ORDER — ONDANSETRON HCL 4 MG PO TABS: 4.0000 mg | ORAL_TABLET | ORAL | Status: DC | PRN

## 2020-03-30 MED ORDER — PRENATAL MULTIVITAMIN CH
1.0000 | ORAL_TABLET | Freq: Every day | ORAL | Status: DC
  Administered 2020-03-30 – 2020-03-31 (×2): 1 via ORAL
  Filled 2020-03-30 (×2): qty 1

## 2020-03-30 MED ORDER — DOCUSATE SODIUM 100 MG PO CAPS
100.0000 mg | ORAL_CAPSULE | Freq: Two times a day (BID) | ORAL | Status: DC
  Administered 2020-03-30 – 2020-03-31 (×2): 100 mg via ORAL
  Filled 2020-03-30 (×3): qty 1

## 2020-03-30 MED ORDER — MISOPROSTOL 200 MCG PO TABS
ORAL_TABLET | ORAL | Status: AC
  Filled 2020-03-30: qty 4

## 2020-03-30 MED ORDER — ONDANSETRON HCL 4 MG/2ML IJ SOLN
4.0000 mg | Freq: Four times a day (QID) | INTRAMUSCULAR | Status: DC | PRN
  Administered 2020-03-30: 4 mg via INTRAVENOUS
  Filled 2020-03-30: qty 2

## 2020-03-30 MED ORDER — LACTATED RINGERS IV SOLN: INTRAVENOUS | Status: DC

## 2020-03-30 MED ORDER — OXYTOCIN-SODIUM CHLORIDE 30-0.9 UT/500ML-% IV SOLN
INTRAVENOUS | Status: AC
  Filled 2020-03-30: qty 1000

## 2020-03-30 MED ORDER — OXYCODONE HCL 5 MG PO TABS: 10.0000 mg | ORAL_TABLET | ORAL | Status: DC | PRN

## 2020-03-30 MED ORDER — OXYTOCIN-SODIUM CHLORIDE 30-0.9 UT/500ML-% IV SOLN: 1.0000 m[IU]/min | INTRAVENOUS | Status: DC

## 2020-03-30 MED ORDER — BENZOCAINE-MENTHOL 20-0.5 % EX AERO
1.0000 "application " | INHALATION_SPRAY | CUTANEOUS | Status: DC | PRN
  Filled 2020-03-30: qty 56

## 2020-03-30 MED ORDER — OXYCODONE-ACETAMINOPHEN 5-325 MG PO TABS: 1.0000 | ORAL_TABLET | ORAL | Status: DC | PRN

## 2020-03-30 NOTE — Progress Notes (Addendum)
Anesthesia notified at 0935 that patient is TOLAC.

## 2020-03-30 NOTE — H&P (Signed)
Obstetric History and Physical  Paige Brown is a 34 y.o. [id number] with IUP at 56w1dpresenting with leakage of fluid and contractions since 0540.   Patient states she has been having  irregularcontractions, none vaginal bleeding, ruptured, clear fluid membranes, with active fetal movement.    Denies difficulty breathing or respiratory distress, chest pain, dysuria, and leg pain or swelling.   Prenatal Course  Source of Care: [facility]-initial visit at 10 weeks, total visits: 11  Pregnancy complications or risks: History of cesarean section-desires vaginal birth, Rh positive, GBS positive, Gestational hypertension  Prenatal labs and studies:  ABO, Rh: O/Positive/-- (06/20/240946)  Antibody: Negative (06-20-240946)  Rubella: 20.80 (2024-06-200946)  RPR: Non Reactive (09/22 0957)   HBsAg: Negative (2024-06-200946)   HIV: Non Reactive (06-20-20240946)   GOMV:EHMCNOBS/- (11/17 1207)  1 hr Glucola: 110 (09/22 0957)  Genetic screening: Low risk female (20-Jun-20240946)  Anatomy [place] Complete, normal (08/03 1327)  Past Medical History:  Diagnosis Date  . Anemia    with pregnancies  . GERD (gastroesophageal reflux disease)    during pregnancy  . UTI (lower urinary tract infection) 11/09/2015   2 more days to complete ATB    Past Surgical History:  Procedure Laterality Date  . CESAREAN SECTION    . TONSILLECTOMY  1994   and adnoids  . WISDOM TOOTH EXTRACTION     2000    OB History  Gravida Para Term Preterm AB Living  _0 SAB TAB Ectopic Multiple Live Births        0 4    # Outcome Date GA Lbr Len/2nd Weight Sex Delivery Anes PTL Lv  5 Current           4 Term 03/16/18 315w0d0:15 / 00:06 3650 g M VBAC None  LIV  3 Term 06/11/16 3723w3d00:41 3118 g M Vag-Spont None  LIV  2 Term 09/11/14 39w29w1d71 g M   Y LIV  1 Term 10/14/11 39w136w1d9 g F CS-Unspec  N LIV    Obstetric Comments  1st pregnancy-breech  2nd pregnancy: PTL at 35 wks./ VBAC- no assistance    Social  History   Socioeconomic History  . Marital status: Married    Spouse name: Not on file  . Number of children: Not on file  . Years of education: Not on file  . Highest education level: Not on file  Occupational History  . Occupation: RN   Programmer, [employer] [place] Tobacco Use  . Smoking status: Never Smoker  . Smokeless tobacco: Never Used  Vaping Use  . Vaping Use: Never used  Substance and Sexual Activity  . Alcohol use: Not Currently    Alcohol/week: 0.0 standard drinks  . Drug use: No  . Sexual activity: Yes    Partners: Male  Other Topics Concern  . Not on file  Social History Narrative  . Not on file   Social Determinants of Health   Financial Resource Strain:   . Difficulty of Paying Living Expenses: Not on file  Food Insecurity:   . Worried About RunniCharity fundraiserhe Last Year: Not on file  . Ran Out of Food in the Last Year: Not on file  Transportation Needs:   . Lack of Transportation (Medical): Not on file  . Lack of Transportation (Non-Medical): Not on file  Physical Activity:   . Days  of Exercise per Week: Not on file  . Minutes of Exercise per Session: Not on file  Stress:   . Feeling of Stress : Not on file  Social Connections:   . Frequency of Communication with Friends and Family: Not on file  . Frequency of Social Gatherings with Friends and Family: Not on file  . Attends Religious Services: Not on file  . Active Member of Clubs or Organizations: Not on file  . Attends Archivist Meetings: Not on file  . Marital Status: Not on file    Family History  Problem Relation Age of Onset  . Cancer Father        multiple Myeloma  . Rheum arthritis Maternal Grandmother   . Osteoarthritis Maternal Grandmother   . Breast cancer Maternal Grandmother 60  . Thyroid disease Maternal Grandmother   . Heart failure Maternal Grandfather   . Hyperlipidemia Maternal Grandfather   . Hypertension Maternal Grandfather   . Asthma  Paternal Grandmother   . Stroke Paternal Grandfather   . Rheum arthritis Maternal Aunt   . Rheum arthritis Maternal Uncle   . Ovarian cancer Neg Hx   . Colon cancer Neg Hx     Medications Prior to Admission  Medication Sig Dispense Refill Last Dose  . famotidine (PEPCID) 20 MG tablet Take 1 tablet (20 mg total) by mouth 2 (two) times daily. 60 tablet 3 Past Month at Unknown time  . pantoprazole (PROTONIX) 40 MG tablet Take 1 tablet (40 mg total) by mouth 2 (two) times daily. 60 tablet 1 03/29/2020 at Unknown time  . Prenatal Vit-Fe Fumarate-FA (PRENATAL MULTIVITAMIN) TABS tablet Take 1 tablet by mouth daily at 12 noon.   03/29/2020 at Unknown time    Allergies  Allergen Reactions  . Ciprofloxacin Nausea And Vomiting  . Penicillins Hives  . Shellfish Allergy Swelling  . Cefaclor Hives  . Levaquin [Levofloxacin]     Review of Systems: Negative except for what is mentioned in HPI.  Physical Exam:  Temp:  [98 F (36.7 C)-98.1 F (36.7 C)] 98 F (36.7 C) (11/29 0914) Pulse Rate:  [90-91] 91 (11/29 0914) Resp:  [16-18] 16 (11/29 0914) BP: (128-131)/(89) 131/89 (11/29 0914) Weight:  [90.7 kg] 90.7 kg (11/29 0757)  GENERAL: Well-developed, well-nourished female in no acute distress.   LUNGS: Clear to auscultation bilaterally.   HEART: Regular rate and rhythm.  ABDOMEN: Soft, nontender, nondistended, gravid.  EXTREMITIES: Nontender, no edema, 2+ distal pulses.  Cervical Exam: Dilation: 4 Effacement (%): 60 Cervical Position: Posterior Presentation: Vertex  FHR Category I  Contractions: Every two (2) to seven (7) minutes, soft resting tone   Pertinent Labs/Studies:   Results for orders placed or performed during the hospital encounter of 03/30/20 (from the past 24 hour(s))  Resp Panel by RT-PCR (Flu A&B, Covid) Nasopharyngeal Swab     Status: None   Collection Time: 03/30/20  7:50 AM   Specimen: Nasopharyngeal Swab; Nasopharyngeal(NP) swabs in vial transport medium   Result Value Ref Range   SARS Coronavirus 2 by RT PCR NEGATIVE NEGATIVE   Influenza A by PCR NEGATIVE NEGATIVE   Influenza B by PCR NEGATIVE NEGATIVE    Assessment :  Paige Brown is a 34 y.o. G5P4004 at 5w1dbeing admitted for full term premature rupture of membranes, Rh positive, GBS positive, Gestational hypertension, History of cesarean section-desires vaginal birth  Plan:  Admit to birthing suites, see orders.   Labor: Expectant management.  Induction/Augmentation as needed, per protocol.  Discussed fetal monitoring recommendation with patient and spouse, desires intermittent auscultation at this time.   Reviewed red flag symptoms and when to call.   Delivery plan: Hopeful for vaginal birth.    Diona Fanti, CNM [facility], [facility] 03/30/20 9:29 AM

## 2020-03-30 NOTE — Lactation Note (Signed)
This note was copied from a baby's chart. Lactation Consultation Note  Patient Name: Paige Brown [id number] Date: 03/30/2020 Reason for consult: Initial assessment;Early term 37-38.6wks  Observed Olsen breast feeding on the left breast in cradle hold skin to skin.  Reminded mom how to hand express colostrum.  He latches without assistance and has strong, rhythmic suck with occasional swallows.  He came on and off the breast several times in the 30 minute breast feed, but mom would always put him back to the breast whenever he demonstrated feeding cues.  Mom denies any breast or nipple pain.  Mom has 4 other children with ages ranging from 62 years old to 33 years old.  She breast fed her other 4 each one for over 1 year without any challenges. Hand out given on what to expect with feeding the first 4 days of life reviewing normal newborn stomach size, adequate intake and out put, normal course of lactation, supply and demand and routine newborn feeding patterns.  Lactation [employer] and [employer]LL hand out given and reviewed contact numbers, informative web sites and support groups which mom could possibly offer support and encouragement to other moms.  Lactation name and number given to mom encouraging to call with any questions, concerns or assistance.   Maternal Data Formula Feeding for Exclusion: No Has patient been taught Hand Expression?: Yes Does the patient have breastfeeding experience prior to this delivery?: Yes  Feeding Feeding Type: Breast Fed  LATCH Score Latch: Grasps breast easily, tongue down, lips flanged, rhythmical sucking.  Audible Swallowing: A few with stimulation  Type of Nipple: Everted at rest and after stimulation  Comfort (Breast/Nipple): Soft / non-tender  Hold (Positioning): No assistance needed to correctly position infant at breast.  LATCH Score: 9  Interventions Interventions: Breast feeding basics reviewed;Assisted with latch;Skin to  skin;Breast massage;Hand express;Reverse pressure;Breast compression;Support pillows;Position options  Lactation Tools Discussed/Used WIC Program: No [facility] Insurance)   Consult Status Consult Status: PRN    Louis Meckel 03/30/2020, 2:00 PM

## 2020-03-31 ENCOUNTER — Encounter: Payer: BC Managed Care – PPO | Admitting: Certified Nurse Midwife

## 2020-03-31 LAB — CBC
HCT: 27.6 % — ABNORMAL LOW (ref 36.0–46.0)
Hemoglobin: 8.9 g/dL — ABNORMAL LOW (ref 12.0–15.0)
MCH: 29.7 pg (ref 26.0–34.0)
MCHC: 32.2 g/dL (ref 30.0–36.0)
MCV: 92 fL (ref 80.0–100.0)
Platelets: 128 10*3/uL — ABNORMAL LOW (ref 150–400)
RBC: 3 MIL/uL — ABNORMAL LOW (ref 3.87–5.11)
RDW: 13.4 % (ref 11.5–15.5)
WBC: 8.7 10*3/uL (ref 4.0–10.5)
nRBC: 0 % (ref 0.0–0.2)

## 2020-03-31 LAB — COMPREHENSIVE METABOLIC PANEL
ALT: 11 U/L (ref 0–44)
AST: 15 U/L (ref 15–41)
Albumin: 2.6 g/dL — ABNORMAL LOW (ref 3.5–5.0)
Alkaline Phosphatase: 72 U/L (ref 38–126)
Anion gap: 8 (ref 5–15)
BUN: 14 mg/dL (ref 6–20)
CO2: 23 mmol/L (ref 22–32)
Calcium: 8.9 mg/dL (ref 8.9–10.3)
Chloride: 107 mmol/L (ref 98–111)
Creatinine, Ser: 0.82 mg/dL (ref 0.44–1.00)
GFR, Estimated: >60 mL/min (ref >60)
Glucose, Bld: 90 mg/dL (ref 70–99)
Potassium: 4 mmol/L (ref 3.5–5.1)
Sodium: 138 mmol/L (ref 135–145)
Total Bilirubin: 0.4 mg/dL (ref 0.3–1.2)
Total Protein: 5.1 g/dL — ABNORMAL LOW (ref 6.5–8.1)

## 2020-03-31 LAB — RPR: RPR Ser Ql: NONREACTIVE

## 2020-03-31 MED ORDER — IBUPROFEN 600 MG PO TABS: 600.0000 mg | ORAL_TABLET | Freq: Four times a day (QID) | ORAL | 0 refills | Status: DC

## 2020-03-31 MED ORDER — ACETAMINOPHEN 325 MG PO TABS: 650.0000 mg | ORAL_TABLET | ORAL | 0 refills | Status: DC | PRN

## 2020-03-31 NOTE — Discharge Instructions (Signed)
Postpartum Baby Blues The postpartum period begins right after the birth of a baby. During this time, there is often a lot of joy and excitement. It is also a time of many changes in the life of the parents. No matter how many times a mother gives birth, each child brings new challenges to the family, including different ways of relating to one another. It is common to have feelings of excitement along with confusing changes in moods, emotions, and thoughts. You may feel happy one minute and sad or stressed the next. These feelings of sadness usually happen in the period right after you have your baby, and they go away within a week or two. This is called the "baby blues." What are the causes? There is no known cause of baby blues. It is likely caused by a combination of factors. However, changes in hormone levels after childbirth are believed to trigger some of the symptoms. Other factors that can play a role in these mood changes include:  Lack of sleep.  Stressful life events, such as poverty, caring for a loved one, or death of a loved one.  Genetics. What are the signs or symptoms? Symptoms of this condition include:  Brief changes in mood, such as going from extreme happiness to sadness.  Decreased concentration.  Difficulty sleeping.  Crying spells and tearfulness.  Loss of appetite.  Irritability.  Anxiety. If the symptoms of baby blues last for more than 2 weeks or become more severe, you may have postpartum depression. How is this diagnosed? This condition is diagnosed based on an evaluation of your symptoms. There are no medical or lab tests that lead to a diagnosis, but there are various questionnaires that a health care provider may use to identify women with the baby blues or postpartum depression. How is this treated? Treatment is not needed for this condition. The baby blues usually go away on their own in 1-2 weeks. Social support is often all that is needed. You will  be encouraged to get adequate sleep and rest. Follow these instructions at home: Lifestyle      Get as much rest as you can. Take a nap when the baby sleeps.  Exercise regularly as told by your health care provider. Some women find yoga and walking to be helpful.  Eat a balanced and nourishing diet. This includes plenty of fruits and vegetables, whole grains, and lean proteins.  Do little things that you enjoy. Have a cup of tea, take a bubble bath, read your favorite magazine, or listen to your favorite music.  Avoid alcohol.  Ask for help with household chores, cooking, grocery shopping, or running errands. Do not try to do everything yourself. Consider hiring a postpartum doula to help. This is a professional who specializes in providing support to new mothers.  Try not to make any major life changes during pregnancy or right after giving birth. This can add stress. General instructions  Talk to people close to you about how you are feeling. Get support from your partner, family members, friends, or other new moms. You may want to join a support group.  Find ways to cope with stress. This may include: ? Writing your thoughts and feelings in a journal. ? Spending time outside. ? Spending time with people who make you laugh.  Try to stay positive in how you think. Think about the things you are grateful for.  Take over-the-counter and prescription medicines only as told by your health care provider.    Let your health care provider know if you have any concerns.  Keep all postpartum visits as told by your health care provider. This is important. Contact a health care provider if:  Your baby blues do not go away after 2 weeks. Get help right away if:  You have thoughts of taking your own life (suicidal thoughts).  You think you may harm the baby or other people.  You see or hear things that are not there (hallucinations). Summary  After giving birth, you may feel happy  one minute and sad or stressed the next. Feelings of sadness that happen right after the baby is born and go away after a week or two are called the "baby blues."  You can manage the baby blues by getting enough rest, eating a healthy diet, exercising, spending time with supportive people, and finding ways to cope with stress.  If feelings of sadness and stress last longer than 2 weeks or get in the way of caring for your baby, talk to your health care provider. This may mean you have postpartum depression. This information is not intended to replace advice given to you by your health care provider. Make sure you discuss any questions you have with your health care provider. Document Revised: 08/10/2018 Document Reviewed: 06/14/2016 Elsevier Patient Education  2020 Elsevier Inc.   Postpartum Care After Vaginal Delivery This sheet gives you information about how to care for yourself from the time you deliver your baby to up to 6-12 weeks after delivery (postpartum period). Your health care provider may also give you more specific instructions. If you have problems or questions, contact your health care provider. Follow these instructions at home: Vaginal bleeding  It is normal to have vaginal bleeding (lochia) after delivery. Wear a sanitary pad for vaginal bleeding and discharge. ? During the first week after delivery, the amount and appearance of lochia is often similar to a menstrual period. ? Over the next few weeks, it will gradually decrease to a dry, yellow-brown discharge. ? For most women, lochia stops completely by 4-6 weeks after delivery. Vaginal bleeding can vary from woman to woman.  Change your sanitary pads frequently. Watch for any changes in your flow, such as: ? A sudden increase in volume. ? A change in color. ? Large blood clots.  If you pass a blood clot from your vagina, save it and call your health care provider to discuss. Do not flush blood clots down the toilet  before talking with your health care provider.  Do not use tampons or douches until your health care provider says this is safe.  If you are not breastfeeding, your period should return 6-8 weeks after delivery. If you are feeding your child breast milk only (exclusive breastfeeding), your period may not return until you stop breastfeeding. Perineal care  Keep the area between the vagina and the anus (perineum) clean and dry as told by your health care provider. Use medicated pads and pain-relieving sprays and creams as directed.  If you had a cut in the perineum (episiotomy) or a tear in the vagina, check the area for signs of infection until you are healed. Check for: ? More redness, swelling, or pain. ? Fluid or blood coming from the cut or tear. ? Warmth. ? Pus or a bad smell.  You may be given a squirt bottle to use instead of wiping to clean the perineum area after you go to the bathroom. As you start healing, you may use the  squirt bottle before wiping yourself. Make sure to wipe gently.  To relieve pain caused by an episiotomy, a tear in the vagina, or swollen veins in the anus (hemorrhoids), try taking a warm sitz bath 2-3 times a day. A sitz bath is a warm water bath that is taken while you are sitting down. The water should only come up to your hips and should cover your buttocks. Breast care  Within the first few days after delivery, your breasts may feel heavy, full, and uncomfortable (breast engorgement). Milk may also leak from your breasts. Your health care provider can suggest ways to help relieve the discomfort. Breast engorgement should go away within a few days.  If you are breastfeeding: ? Wear a bra that supports your breasts and fits you well. ? Keep your nipples clean and dry. Apply creams and ointments as told by your health care provider. ? You may need to use breast pads to absorb milk that leaks from your breasts. ? You may have uterine contractions every time  you breastfeed for up to several weeks after delivery. Uterine contractions help your uterus return to its normal size. ? If you have any problems with breastfeeding, work with your health care provider or Science writer.  If you are not breastfeeding: ? Avoid touching your breasts a lot. Doing this can make your breasts produce more milk. ? Wear a good-fitting bra and use cold packs to help with swelling. ? Do not squeeze out (express) milk. This causes you to make more milk. Intimacy and sexuality  Ask your health care provider when you can engage in sexual activity. This may depend on: ? Your risk of infection. ? How fast you are healing. ? Your comfort and desire to engage in sexual activity.  You are able to get pregnant after delivery, even if you have not had your period. If desired, talk with your health care provider about methods of birth control (contraception). Medicines  Take over-the-counter and prescription medicines only as told by your health care provider.  If you were prescribed an antibiotic medicine, take it as told by your health care provider. Do not stop taking the antibiotic even if you start to feel better. Activity  Gradually return to your normal activities as told by your health care provider. Ask your health care provider what activities are safe for you.  Rest as much as possible. Try to rest or take a nap while your baby is sleeping. Eating and drinking   Drink enough fluid to keep your urine pale yellow.  Eat high-fiber foods every day. These may help prevent or relieve constipation. High-fiber foods include: ? Whole grain cereals and breads. ? Brown rice. ? Beans. ? Fresh fruits and vegetables.  Do not try to lose weight quickly by cutting back on calories.  Take your prenatal vitamins until your postpartum checkup or until your health care provider tells you it is okay to stop. Lifestyle  Do not use any products that contain nicotine  or tobacco, such as cigarettes and e-cigarettes. If you need help quitting, ask your health care provider.  Do not drink alcohol, especially if you are breastfeeding. General instructions  Keep all follow-up visits for you and your baby as told by your health care provider. Most women visit their health care provider for a postpartum checkup within the first 3-6 weeks after delivery. Contact a health care provider if:  You feel unable to cope with the changes that your child brings to  your life, and these feelings do not go away.  You feel unusually sad or worried.  Your breasts become red, painful, or hard.  You have a fever.  You have trouble holding urine or keeping urine from leaking.  You have little or no interest in activities you used to enjoy.  You have not breastfed at all and you have not had a menstrual period for 12 weeks after delivery.  You have stopped breastfeeding and you have not had a menstrual period for 12 weeks after you stopped breastfeeding.  You have questions about caring for yourself or your baby.  You pass a blood clot from your vagina. Get help right away if:  You have chest pain.  You have difficulty breathing.  You have sudden, severe leg pain.  You have severe pain or cramping in your lower abdomen.  You bleed from your vagina so much that you fill more than one sanitary pad in one hour. Bleeding should not be heavier than your heaviest period.  You develop a severe headache.  You faint.  You have blurred vision or spots in your vision.  You have bad-smelling vaginal discharge.  You have thoughts about hurting yourself or your baby. If you ever feel like you may hurt yourself or others, or have thoughts about taking your own life, get help right away. You can go to the nearest emergency department or call:  Your local emergency services (911 in the [place]  A suicide crisis helpline, such as the National Suicide Prevention Lifeline  at 3252403101. This is open 24 hours a day. Summary  The period of time right after you deliver your newborn up to 6-12 weeks after delivery is called the postpartum period.  Gradually return to your normal activities as told by your health care provider.  Keep all follow-up visits for you and your baby as told by your health care provider. This information is not intended to replace advice given to you by your health care provider. Make sure you discuss any questions you have with your health care provider. Document Revised: 04/21/2017 Document Reviewed: 01/30/2017 Elsevier Patient Education  2020 [employer].

## 2020-03-31 NOTE — Discharge Summary (Signed)
Obstetric Discharge Summary  Patient ID: Paige Brown MRN: [id number] DOB/AGE: 08-17-1985 34 y.o.   Date of Admission: 03/30/2020  Date of Discharge:  03/31/20  Admitting Diagnosis: Onset of Labor at [redacted]w[redacted]d  Secondary Diagnosis:   Patient Active Problem List   Diagnosis Date Noted  . Full-term premature rupture of membranes 03/30/2020  . Gestational hypertension affecting fifth pregnancy 03/30/2020  . [redacted] weeks gestation of pregnancy   . Labor and delivery, indication for care 03/27/2020  . History of recurrent UTIs 04/20/2018  . GBS bacteriuria 09/19/2017  . Patient desires vaginal birth after cesarean section (VBAC) 06/11/2016  . Vitamin D deficiency 04/12/2016  . Temporary low platelet count (HCC) 04/12/2016  . History of vaginal delivery following previous cesarean delivery 01/05/2016    Mode of Delivery: normal spontaneous vaginal delivery     Discharge Diagnosis: No other diagnosis   Intrapartum Procedures: GBS prophylaxis   Post partum procedures: None  Complications: Right labial laceration, unrepaired   Brief Hospital Course   Paige Brown is a [id number] who had a SVD on 03/30/2020;  for further details of this birth, please refer to the delivey summary.  Patient had an uncomplicated postpartum course.  By time of discharge on PPD#1, her pain was controlled on oral pain medications; she had appropriate lochia and was ambulating, voiding without difficulty and tolerating regular diet.  She was deemed stable for discharge to home.    Labs: CBC Latest Ref Rng & Units 03/31/2020 03/30/2020 03/27/2020  WBC 4.0 - 10.5 K/uL 8.7 8.2 7.2  Hemoglobin 12.0 - 15.0 g/dL 1.7(E) 11.2(L) 10.1(L)  Hematocrit 36 - 46 % 27.6(L) 33.3(L) 30.8(L)  Platelets 150 - 400 K/uL 128(L) 144(L) 141(L)   O POS  Physical exam:   Temp:  [98.1 F (36.7 C)-98.5 F (36.9 C)] 98.1 F (36.7 C) (11/30 0856) Pulse Rate:  [65-179] 75 (11/30 0856) Resp:  [16-20] 18 (11/30 0856) BP:  (92-128)/(53-89) 119/88 (11/30 0856) SpO2:  [96 %-99 %] 99 % (11/30 0856)  General: alert and no distress  Lochia: appropriate  Abdomen: soft, NT  Uterine Fundus: firm  Perineum: healing well, no significant drainage, no dehiscence, no significant erythema  Extremities: No evidence of DVT seen on physical exam. No lower extremity edema.  Edinburgh Postnatal Depression Scale Screening Tool 03/30/2020  I have been able to laugh and see the funny side of things. 0  I have looked forward with enjoyment to things. 0  I have blamed myself unnecessarily when things went wrong. 1  I have been anxious or worried for no good reason. 0  I have felt scared or panicky for no good reason. 3  Things have been getting on top of me. 0  I have been so unhappy that I have had difficulty sleeping. 0  I have felt sad or miserable. 1  I have been so unhappy that I have been crying. 0  The thought of harming myself has occurred to me. 0  Edinburgh Postnatal Depression Scale Total 5      Discharge Instructions: Per After Visit Summary.  Activity: Advance as tolerated. Pelvic rest for 6 weeks.  Also refer to After Visit Summary  Diet: Regular  Medications: Allergies as of 03/31/2020      Reactions   Ciprofloxacin Nausea And Vomiting   Penicillins Hives   Shellfish Allergy Swelling   Cefaclor Hives   Levaquin [levofloxacin]       Medication List    STOP taking these medications   famotidine 20  MG tablet Commonly known as: PEPCID     TAKE these medications   acetaminophen 325 MG tablet Commonly known as: Tylenol Take 2 tablets (650 mg total) by mouth every 4 (four) hours as needed (for pain scale < 4).   ibuprofen 600 MG tablet Commonly known as: ADVIL Take 1 tablet (600 mg total) by mouth every 6 (six) hours.   pantoprazole 40 MG tablet Commonly known as: Protonix Take 1 tablet (40 mg total) by mouth 2 (two) times daily.   prenatal multivitamin Tabs tablet Take 1 tablet by  mouth daily at 12 noon.      Outpatient follow up:   Follow-up Information    Gunnar Bulla, CNM Follow up.   Specialties: Certified Nurse Midwife, Obstetrics and Gynecology, Radiology Why: Someone from the office will call you to schedule a two (2) week TELEVISIT and six (6) week postpartum visit Contact information: [address] [place] [address] [phone]              Postpartum contraception: vasectomy  Discharged Condition: stable  Discharged to: home   Newborn Data:  Disposition:home with mother  Apgars: APGAR (1 MIN): 8   APGAR (5 MINS): 9    Baby Feeding: Breast   Paige Brown, CNM [facility], [facility] 03/31/20 10:15 AM

## 2020-03-31 NOTE — Lactation Note (Signed)
This note was copied from a baby's chart. Lactation Consultation Note  Patient Name: Paige Brown MYTRZ'N Date: 03/31/2020 Reason for consult: Follow-up assessment;Early term 49-38.6wks  Lactation follow-up. Baby continues to feed well with output exceeding expectations for HOL. Mom is an experienced breastfeeder who reports no concerns at this time. [facility] student provided breastfeeding basics and upcoming potential for growth spurts and cluster feeding and what to expect the first week of breastfeeding. Mom has no questions; encouraged to call out for support as needed.  Maternal Data Formula Feeding for Exclusion: No Has patient been taught Hand Expression?: Yes Does the patient have breastfeeding experience prior to this delivery?: Yes  Feeding Feeding Type: Breast Fed  LATCH Score                   Interventions Interventions: Breast feeding basics reviewed  Lactation Tools Discussed/Used     Consult Status Consult Status: PRN    Danford Bad 03/31/2020, 2:16 PM

## 2020-04-01 NOTE — Progress Notes (Signed)
All discharge instructions reviewed with pt; pt in wheelchair with baby in car seat and car seat in her lap; pt escorted to medical mall by nurse tech; pt going home with husband and new baby

## 2020-04-06 ENCOUNTER — Encounter: Payer: BC Managed Care – PPO | Admitting: Certified Nurse Midwife

## 2020-04-13 ENCOUNTER — Ambulatory Visit (INDEPENDENT_AMBULATORY_CARE_PROVIDER_SITE_OTHER): Payer: BC Managed Care – PPO | Admitting: Certified Nurse Midwife

## 2020-04-13 ENCOUNTER — Other Ambulatory Visit: Payer: Self-pay

## 2020-04-13 ENCOUNTER — Encounter: Payer: Self-pay | Admitting: Certified Nurse Midwife

## 2020-04-13 DIAGNOSIS — Z1331 Encounter for screening for depression: Secondary | ICD-10-CM

## 2020-04-13 DIAGNOSIS — Z98891 History of uterine scar from previous surgery: Secondary | ICD-10-CM

## 2020-04-13 NOTE — Progress Notes (Signed)
Virtual Visit via Telephone Note  I connected with Paige Brown on 04/13/20 at  4:30 PM EST by telephone and verified that I am speaking with the correct person using two identifiers.  Location:  Patient: Paige Brown (Home)  Provider: Serafina Royals, CNM ([facility], [facility])   I discussed the limitations, risks, security and privacy concerns of performing an evaluation and management service by telephone and the availability of in person appointments. I also discussed with the patient that there may be a patient responsible charge related to this service. The patient expressed understanding and agreed to proceed.   History of Present Illness:   Patient is two (2) week postpartum spontaneous vaginal birth after cesarean section of full term female infant.   Doing well overall. Infant is a "night owl", so patient is tired.   Breastfeeding well. Bleeding is light. Voiding/Stooling without difficulties.   Denies difficulty breathing or respiratory distress, chest pain, abdominal pain, excessive vaginal bleeding, dysuria, and leg pain or swelling.    Observations/Objective:  GAD 7 : Generalized Anxiety Score 04/13/2020  Nervous, Anxious, on Edge 0  Control/stop worrying 0  Worry too much - different things 0  Trouble relaxing 0  Restless 0  Easily annoyed or irritable 1  Afraid - awful might happen 0  Total GAD 7 Score 1    Depression screen [facility] 2/9 04/13/2020 07/20/2016  Decreased Interest 1 0  Down, Depressed, Hopeless 0 0  PHQ - 2 Score 1 0  Altered sleeping 0 0  Tired, decreased energy 2 0  Change in appetite 0 0  Feeling bad or failure about yourself  0 0  Trouble concentrating 0 0  Moving slowly or fidgety/restless 0 0  Suicidal thoughts 0 0  PHQ-9 Score 3 0  Some encounter information is confidential and restricted. Go to Review Flowsheets activity to see all data.    Assessment:  Postpartum care and examination Lactating  mother Depression screening negative  Plan:  Routine postpartum education, see AVS.   Encouraged self care measures.   Reviewed red flag symptoms and when to call.   RTC for PPV as previously scheduled or sooner if needed.    Follow Up Instructions:    I discussed the assessment and treatment plan with the patient. The patient was provided an opportunity to ask questions and all were answered. The patient agreed with the plan and demonstrated an understanding of the instructions.   The patient was advised to call back or seek an in-person evaluation if the symptoms worsen or if the condition fails to improve as anticipated.  I provided 6 minutes of non-face-to-face time during this encounter.   Serafina Royals, CNM [facility], [facility] 04/13/20 4:42 PM

## 2020-04-15 LAB — STREP GP B SUSCEPTIBILITY

## 2020-04-15 LAB — SPECIMEN STATUS REPORT

## 2020-05-11 ENCOUNTER — Encounter: Payer: Self-pay | Admitting: Certified Nurse Midwife

## 2020-05-11 ENCOUNTER — Ambulatory Visit (INDEPENDENT_AMBULATORY_CARE_PROVIDER_SITE_OTHER): Payer: BC Managed Care – PPO | Admitting: Certified Nurse Midwife

## 2020-05-11 ENCOUNTER — Other Ambulatory Visit: Payer: Self-pay

## 2020-05-11 DIAGNOSIS — Z1331 Encounter for screening for depression: Secondary | ICD-10-CM

## 2020-05-11 NOTE — Progress Notes (Signed)
Subjective:    Paige Brown is a 35 y.o. G34P5005 Caucasian female who presents for a postpartum visit. She is 6 week postpartum following a vaginal birth after cesarean (VBAC) at [redacted]w[redacted]d gestational weeks. Anesthesia: none. I have fully reviewed the prenatal and intrapartum course.   Postpartum course has been uncomplicated, hemorrhoids resolved approximately two (2) weeks ago.   Baby's course has been uncomplicated. Baby is feeding by breast.   Bleeding no bleeding. Bowel function is normal. Bladder function is normal.   Patient is not sexually active. Contraception method is vasectomy.   Postpartum depression screening: negative. Score 0.   Last pap 11/16/2018 and was Neg/Neg.  Denies respiratory distress, difficulty breathing, chest pain, constipation, cramping, and leg swelling or pain.  The following portions of the patient's history were reviewed and updated as appropriate: allergies, current medications, past medical history, past surgical history and problem list.  Review of Systems  ROS- Negative other than what was reported above. Information obtained verbally from patient.  Objective:   BP (!) 124/96   Pulse 84   Ht 5\' 5"  (1.651 m)   Wt 184 lb 3 oz (83.5 kg)   Breastfeeding Yes   BMI 30.65 kg/m   General:  alert, cooperative and no distress   Breasts:  deferred, no complaints  Lungs: clear to auscultation bilaterally  Heart:  regular rate and rhythm  Abdomen: soft, nontender, bowel sounds    Vulva: normal  Vagina: normal vagina  Cervix:  normal  Corpus: Normal  Adnexa:  Normal  Rectal Exam: small hemorrhoids, non-painful        Flowsheet Row Office Visit from 05/11/2020 in [facility]  PHQ-9 Total Score 0     Assessment:   Postpartum exam 6 wks spontateous vaginal delivery Breastfeeding Depression screening Contraception counseling   Plan:   Encouraged routine health maintenance techniques.   Reviewed red flag symptoms and when to  call.   RTC x 9 months for ANNUAL or sooner if needed.   07/09/2020, Student-MidWife  [employer] 05/11/20 11:29 AM    .

## 2020-05-11 NOTE — Progress Notes (Signed)
I have seen, interviewed, and examined the patient in conjunction with the [employer] and affirm the diagnosis and management plan.   Gunnar Bulla, CNM [facility], [facility] 05/11/20 11:40 AM

## 2020-05-11 NOTE — Patient Instructions (Signed)
Postpartum Care After Vaginal Delivery The following information offers guidance about how to care for yourself from the time you deliver your baby to 6-12 weeks after delivery (postpartum period). If you have problems or questions, contact your health care provider for more specific instructions. Follow these instructions at home: Vaginal bleeding  It is normal to have vaginal bleeding (lochia) after delivery. Wear a sanitary pad for bleeding and discharge. ? During the first week after delivery, the amount and appearance of lochia is often similar to a menstrual period. ? Over the next few weeks, it will gradually decrease to a dry, yellow-brown discharge. ? For most women, lochia stops completely by 4-6 weeks after delivery, but can vary.  Change your sanitary pads frequently. Watch for any changes in your flow, such as: ? A sudden increase in volume. ? A change in color. ? Large blood clots.  If you pass a blood clot from your vagina, save it and call your health care provider. Do not flush blood clots down the toilet before talking with your health care provider.  Do not use tampons or douches until your health care provider approves.  If you are not breastfeeding, your period should return 6-8 weeks after delivery. If you are feeding your baby breast milk only, your period may not return until you stop breastfeeding. Perineal care  Keep the area between the vagina and the anus (perineum) clean and dry. Use medicated pads and pain-relieving sprays and creams as directed.  If you had a surgical cut in the perineum (episiotomy) or a tear, check the area for signs of infection until you are healed. Check for: ? More redness, swelling, or pain. ? Fluid or blood coming from the cut or tear. ? Warmth. ? Pus or a bad smell.  You may be given a squirt bottle to use instead of wiping to clean the perineum area after you use the bathroom. Pat the area gently to dry it.  To relieve pain  caused by an episiotomy, a tear, or swollen veins in the anus (hemorrhoids), take a warm sitz bath 2-3 times a day. In a sitz bath, the warm water should only come up to your hips and cover your buttocks.   Breast care  In the first few days after delivery, your breasts may feel heavy, full, and uncomfortable (breast engorgement). Milk may also leak from your breasts. Ask your health care provider about ways to help relieve the discomfort.  If you are breastfeeding: ? Wear a bra that supports your breasts and fits well. Use breast pads to absorb milk that leaks. ? Keep your nipples clean and dry. Apply creams and ointments as told. ? You may have uterine contractions every time you breastfeed for up to several weeks after delivery. This helps your uterus return to its normal size. ? If you have any problems with breastfeeding, notify your health care provider or lactation consultant.  If you are not breastfeeding: ? Avoid touching your breasts. Do not squeeze out (express) milk. Doing this can make your breasts produce more milk. ? Wear a good-fitting bra and use cold packs to help with swelling. Intimacy and sexuality  Ask your health care provider when you can engage in sexual activity. This may depend upon: ? Your risk of infection. ? How fast you are healing. ? Your comfort and desire to engage in sexual activity.  You are able to get pregnant after delivery, even if you have not had your period. Talk with  your health care provider about methods of birth control (contraception) or family planning if you desire future pregnancies. Medicines  Take over-the-counter and prescription medicines only as told by your health care provider.  Take an over-the-counter stool softener to help ease bowel movements as told by your health care provider.  If you were prescribed an antibiotic medicine, take it as told by your health care provider. Do not stop taking the antibiotic even if you start to  feel better.  Review all previous and current prescriptions to check for possible transfer into breast milk. Activity  Gradually return to your normal activities as told by your health care provider.  Rest as much as possible. Nap while your baby is sleeping. Eating and drinking  Drink enough fluid to keep your urine pale yellow.  To help prevent or relieve constipation, eat high-fiber foods every day.  Choose healthy eating to support breastfeeding or weight loss goals.  Take your prenatal vitamins until your health care provider tells you to stop.   General tips/recommendations  Do not use any products that contain nicotine or tobacco. These products include cigarettes, chewing tobacco, and vaping devices, such as e-cigarettes. If you need help quitting, ask your health care provider.  Do not drink alcohol, especially if you are breastfeeding.  Do not take medications or drugs that are not prescribed to you, especially if you are breastfeeding.  Visit your health care provider for a postpartum checkup within the first 3-6 weeks after delivery.  Complete a comprehensive postpartum visit no later than 12 weeks after delivery.  Keep all follow-up visits for you and your baby. Contact a health care provider if:  You feel unusually sad or worried.  Your breasts become red, painful, or hard.  You have a fever or other signs of an infection.  You have bleeding that is soaking through one pad an hour or you have blood clots.  You have a severe headache that doesn't go away or you have vision changes.  You have nausea and vomiting and are unable to eat or drink anything for 24 hours. Get help right away if:  You have chest pain or difficulty breathing.  You have sudden, severe leg pain.  You faint or have a seizure.  You have thoughts about hurting yourself or your baby. If you ever feel like you may hurt yourself or others, or have thoughts about taking your own life,  get help right away. Go to your nearest emergency department or:  Call your local emergency services (911 in the [place]  The National Suicide Prevention Lifeline at 647-461-7963. This suicide crisis helpline is open 24 hours a day.  Text the Crisis Text Line at 450-722-5546 (in the [place].). Summary  The period of time after you deliver your newborn up to 6-12 weeks after delivery is called the postpartum period.  Keep all follow-up visits for you and your baby.  Review all previous and current prescriptions to check for possible transfer into breast milk.  Contact a health care provider if you feel unusually sad or worried during the postpartum period. This information is not intended to replace advice given to you by your health care provider. Make sure you discuss any questions you have with your health care provider. Document Revised: 01/02/2020 Document Reviewed: 01/02/2020 Elsevier Patient Education  2021 [employer].     Breastfeeding and Breast Care It is normal to have some problems when you start to breastfeed your new baby. But there are things  that you can do to take care of yourself and help prevent problems. This includes keeping your breasts healthy and making sure that your baby's mouth attaches (latches) properly to your nipple for feedings. Work with your doctor or breastfeeding specialist to find what works best for you. How does self-care benefit me? If you keep your breasts healthy and you let your baby attach to your nipples in the right way, you will avoid these problems:  Cracked or sore nipples.  Breasts becoming overfilled with milk.  Plugged milk ducts.  Low milk supply.  Breast swelling or infection. How does self-care benefit my baby? By preventing problems with your breasts, you will ensure that your baby will feed well and will gain the right amount of weight. What actions can I take to care for myself during breastfeeding? Best ways to  breastfeed  Always make sure that your baby latches properly to breastfeed.  Make sure that your baby is in a proper position. Try different breastfeeding positions to find one that works best for you and your baby.  Breastfeed when you feel like you need to make your breasts less full or when your baby shows signs of hunger. This is called "breastfeeding on demand."  Do not delay feedings.  Try to relax when it is time to feed your baby. This helps your body release milk from your breast.  To help increase milk flow, do these things before feeding: ? Remove a small amount of milk from your breast. Use a pump or squeeze with your hand. ? Apply warm, moist heat to your breast. Do this in the shower or use hand towels soaked with warm water. ? Massage your breasts. Do this when you are breastfeeding as well. Caring for your breasts  To help your breasts stay healthy and keep them from getting too dry: ? Avoid using soap on your nipples. ? Let your nipples air-dry for 3-4 minutes after each feeding.  Do not use things like a hair dryer to dry your breasts. This can make the skin dry and will cause irritation and pain. ? Use only cotton bra pads to soak up breast milk that leaks. Change the pads if they become soaked with milk. If you use bra pads that can be thrown away, change them often. ? Put some lanolin on your nipples after breastfeeding. Pure lanolin does not need to be washed off your nipple before you feed your baby again. Pure lanolin is not harmful to your baby. ? Rub some breast milk into your nipples:  Use your hand to squeeze out a few drops of breast milk.  Gently massage the milk into your nipples.  Let your nipples air-dry.  Wear a supportive nursing bra. Avoid wearing: ? Tight clothing. ? Underwire bras or bras that put pressure on your breasts.  Use ice to help relieve pain or swelling of your breasts: ? Put ice in a plastic bag. ? Place a towel between your  skin and the bag. ? Leave the ice on for 20 minutes, 2-3 times a day.      Follow these instructions at home:  Drink enough fluid to keep your pee (urine) pale yellow.  Get plenty of rest. Sleep when your baby sleeps.  Talk to your doctor or breastfeeding specialist before taking any herbal supplements.  Eat a balanced diet. This includes fruits, vegetables, whole grains, lean proteins, and dairy or dairy alternatives Contact a health care provider if:  You have nipple pain.  You have cracking or soreness in your nipples that lasts longer than 1 week.  Your breasts are overfilled with milk, and this lasts longer than 48 hours.  You have a fever.  You have pus-like fluid coming from your nipple.  You have redness, a rash, swelling, itching, or burning on your breast.  Your baby does not gain weight.  Your baby loses weight.  Your baby is not feeding regularly or is very sleepy and lacks energy. Summary  There are things that you can do to take care of yourself and help prevent many common breastfeeding problems.  Always make sure that your baby's mouth attaches (latches) to your nipple properly to breastfeed.  Keep your nipples from getting too dry, drink plenty of fluid, and get plenty of rest.  Feed on demand. Do not delay feedings. This information is not intended to replace advice given to you by your health care provider. Make sure you discuss any questions you have with your health care provider. Document Revised: 10/08/2019 Document Reviewed: 10/08/2019 Elsevier Patient Education  [employer].    Breastfeeding  Choosing to breastfeed is one of the best decisions you can make for yourself and your baby. A change in hormones during pregnancy causes your breasts to make breast milk in your milk-producing glands. Hormones prevent breast milk from being released before your baby is born. They also prompt milk flow after birth. Once breastfeeding has begun,  thoughts of your baby, as well as his or her sucking or crying, can stimulate the release of milk from your milk-producing glands. Benefits of breastfeeding Research shows that breastfeeding offers many health benefits for infants and mothers. It also offers a cost-free and convenient way to feed your baby. For your baby  Your first milk (colostrum) helps your baby's digestive system to function better.  Special cells in your milk (antibodies) help your baby to fight off infections.  Breastfed babies are less likely to develop asthma, allergies, obesity, or type 2 diabetes. They are also at lower risk for sudden infant death syndrome (SIDS).  Nutrients in breast milk are better able to meet your baby's needs compared to infant formula.  Breast milk improves your baby's brain development. For you  Breastfeeding helps to create a very special bond between you and your baby.  Breastfeeding is convenient. Breast milk costs nothing and is always available at the correct temperature.  Breastfeeding helps to burn calories. It helps you to lose the weight that you gained during pregnancy.  Breastfeeding makes your uterus return faster to its size before pregnancy. It also slows bleeding (lochia) after you give birth.  Breastfeeding helps to lower your risk of developing type 2 diabetes, osteoporosis, rheumatoid arthritis, cardiovascular disease, and breast, ovarian, uterine, and endometrial cancer later in life. Breastfeeding basics Starting breastfeeding  Find a comfortable place to sit or lie down, with your neck and back well-supported.  Place a pillow or a rolled-up blanket under your baby to bring him or her to the level of your breast (if you are seated). Nursing pillows are specially designed to help support your arms and your baby while you breastfeed.  Make sure that your baby's tummy (abdomen) is facing your abdomen.  Gently massage your breast. With your fingertips, massage from  the outer edges of your breast inward toward the nipple. This encourages milk flow. If your milk flows slowly, you may need to continue this action during the feeding.  Support your breast with 4 fingers underneath  and your thumb above your nipple (make the letter "C" with your hand). Make sure your fingers are well away from your nipple and your baby's mouth.  Stroke your baby's lips gently with your finger or nipple.  When your baby's mouth is open wide enough, quickly bring your baby to your breast, placing your entire nipple and as much of the areola as possible into your baby's mouth. The areola is the colored area around your nipple. ? More areola should be visible above your baby's upper lip than below the lower lip. ? Your baby's lips should be opened and extended outward (flanged) to ensure an adequate, comfortable latch. ? Your baby's tongue should be between his or her lower gum and your breast.  Make sure that your baby's mouth is correctly positioned around your nipple (latched). Your baby's lips should create a seal on your breast and be turned out (everted).  It is common for your baby to suck about 2-3 minutes in order to start the flow of breast milk. Latching Teaching your baby how to latch onto your breast properly is very important. An improper latch can cause nipple pain, decreased milk supply, and poor weight gain in your baby. Also, if your baby is not latched onto your nipple properly, he or she may swallow some air during feeding. This can make your baby fussy. Burping your baby when you switch breasts during the feeding can help to get rid of the air. However, teaching your baby to latch on properly is still the best way to prevent fussiness from swallowing air while breastfeeding. Signs that your baby has successfully latched onto your nipple  Silent tugging or silent sucking, without causing you pain. Infant's lips should be extended outward (flanged).  Swallowing  heard between every 3-4 sucks once your milk has started to flow (after your let-down milk reflex occurs).  Muscle movement above and in front of his or her ears while sucking. Signs that your baby has not successfully latched onto your nipple  Sucking sounds or smacking sounds from your baby while breastfeeding.  Nipple pain. If you think your baby has not latched on correctly, slip your finger into the corner of your baby's mouth to break the suction and place it between your baby's gums. Attempt to start breastfeeding again. Signs of successful breastfeeding Signs from your baby  Your baby will gradually decrease the number of sucks or will completely stop sucking.  Your baby will fall asleep.  Your baby's body will relax.  Your baby will retain a small amount of milk in his or her mouth.  Your baby will let go of your breast by himself or herself. Signs from you  Breasts that have increased in firmness, weight, and size 1-3 hours after feeding.  Breasts that are softer immediately after breastfeeding.  Increased milk volume, as well as a change in milk consistency and color by the fifth day of breastfeeding.  Nipples that are not sore, cracked, or bleeding. Signs that your baby is getting enough milk  Wetting at least 1-2 diapers during the first 24 hours after birth.  Wetting at least 5-6 diapers every 24 hours for the first week after birth. The urine should be clear or pale yellow by the age of 5 days.  Wetting 6-8 diapers every 24 hours as your baby continues to grow and develop.  At least 3 stools in a 24-hour period by the age of 5 days. The stool should be soft and yellow.  At least 3 stools in a 24-hour period by the age of 7 days. The stool should be seedy and yellow.  No loss of weight greater than 10% of birth weight during the first 3 days of life.  Average weight gain of 4-7 oz (113-198 g) per week after the age of 4 days.  Consistent daily weight gain  by the age of 5 days, without weight loss after the age of 2 weeks. After a feeding, your baby may spit up a small amount of milk. This is normal. Breastfeeding frequency and duration Frequent feeding will help you make more milk and can prevent sore nipples and extremely full breasts (breast engorgement). Breastfeed when you feel the need to reduce the fullness of your breasts or when your baby shows signs of hunger. This is called "breastfeeding on demand." Signs that your baby is hungry include:  Increased alertness, activity, or restlessness.  Movement of the head from side to side.  Opening of the mouth when the corner of the mouth or cheek is stroked (rooting).  Increased sucking sounds, smacking lips, cooing, sighing, or squeaking.  Hand-to-mouth movements and sucking on fingers or hands.  Fussing or crying. Avoid introducing a pacifier to your baby in the first 4-6 weeks after your baby is born. After this time, you may choose to use a pacifier. Research has shown that pacifier use during the first year of a baby's life decreases the risk of sudden infant death syndrome (SIDS). Allow your baby to feed on each breast as long as he or she wants. When your baby unlatches or falls asleep while feeding from the first breast, offer the second breast. Because newborns are often sleepy in the first few weeks of life, you may need to awaken your baby to get him or her to feed. Breastfeeding times will vary from baby to baby. However, the following rules can serve as a guide to help you make sure that your baby is properly fed:  Newborns (babies 42 weeks of age or younger) may breastfeed every 1-3 hours.  Newborns should not go without breastfeeding for longer than 3 hours during the day or 5 hours during the night.  You should breastfeed your baby a minimum of 8 times in a 24-hour period. Breast milk pumping Pumping and storing breast milk allows you to make sure that your baby is exclusively  fed your breast milk, even at times when you are unable to breastfeed. This is especially important if you go back to work while you are still breastfeeding, or if you are not able to be present during feedings. Your lactation consultant can help you find a method of pumping that works best for you and give you guidelines about how long it is safe to store breast milk.      Caring for your breasts while you breastfeed Nipples can become dry, cracked, and sore while breastfeeding. The following recommendations can help keep your breasts moisturized and healthy:  Avoid using soap on your nipples.  Wear a supportive bra designed especially for nursing. Avoid wearing underwire-style bras or extremely tight bras (sports bras).  Air-dry your nipples for 3-4 minutes after each feeding.  Use only cotton bra pads to absorb leaked breast milk. Leaking of breast milk between feedings is normal.  Use lanolin on your nipples after breastfeeding. Lanolin helps to maintain your skin's normal moisture barrier. Pure lanolin is not harmful (not toxic) to your baby. You may also hand express a few drops  of breast milk and gently massage that milk into your nipples and allow the milk to air-dry. In the first few weeks after giving birth, some women experience breast engorgement. Engorgement can make your breasts feel heavy, warm, and tender to the touch. Engorgement peaks within 3-5 days after you give birth. The following recommendations can help to ease engorgement:  Completely empty your breasts while breastfeeding or pumping. You may want to start by applying warm, moist heat (in the shower or with warm, water-soaked hand towels) just before feeding or pumping. This increases circulation and helps the milk flow. If your baby does not completely empty your breasts while breastfeeding, pump any extra milk after he or she is finished.  Apply ice packs to your breasts immediately after breastfeeding or pumping,  unless this is too uncomfortable for you. To do this: ? Put ice in a plastic bag. ? Place a towel between your skin and the bag. ? Leave the ice on for 20 minutes, 2-3 times a day.  Make sure that your baby is latched on and positioned properly while breastfeeding. If engorgement persists after 48 hours of following these recommendations, contact your health care provider or a Science writer. Overall health care recommendations while breastfeeding  Eat 3 healthy meals and 3 snacks every day. Well-nourished mothers who are breastfeeding need an additional 450-500 calories a day. You can meet this requirement by increasing the amount of a balanced diet that you eat.  Drink enough water to keep your urine pale yellow or clear.  Rest often, relax, and continue to take your prenatal vitamins to prevent fatigue, stress, and low vitamin and mineral levels in your body (nutrient deficiencies).  Do not use any products that contain nicotine or tobacco, such as cigarettes and e-cigarettes. Your baby may be harmed by chemicals from cigarettes that pass into breast milk and exposure to secondhand smoke. If you need help quitting, ask your health care provider.  Avoid alcohol.  Do not use illegal drugs or marijuana.  Talk with your health care provider before taking any medicines. These include over-the-counter and prescription medicines as well as vitamins and herbal supplements. Some medicines that may be harmful to your baby can pass through breast milk.  It is possible to become pregnant while breastfeeding. If birth control is desired, ask your health care provider about options that will be safe while breastfeeding your baby. Where to find more information: [employer]: www.llli.org Contact a health care provider if:  You feel like you want to stop breastfeeding or have become frustrated with breastfeeding.  Your nipples are cracked or bleeding.  Your breasts are red,  tender, or warm.  You have: ? Painful breasts or nipples. ? A swollen area on either breast. ? A fever or chills. ? Nausea or vomiting. ? Drainage other than breast milk from your nipples.  Your breasts do not become full before feedings by the fifth day after you give birth.  You feel sad and depressed.  Your baby is: ? Too sleepy to eat well. ? Having trouble sleeping. ? More than 108 week old and wetting fewer than 6 diapers in a 24-hour period. ? Not gaining weight by 37 days of age.  Your baby has fewer than 3 stools in a 24-hour period.  Your baby's skin or the white parts of his or her eyes become yellow. Get help right away if:  Your baby is overly tired (lethargic) and does not want to wake up  and feed.  Your baby develops an unexplained fever. Summary  Breastfeeding offers many health benefits for infant and mothers.  Try to breastfeed your infant when he or she shows early signs of hunger.  Gently tickle or stroke your baby's lips with your finger or nipple to allow the baby to open his or her mouth. Bring the baby to your breast. Make sure that much of the areola is in your baby's mouth. Offer one side and burp the baby before you offer the other side.  Talk with your health care provider or lactation consultant if you have questions or you face problems as you breastfeed. This information is not intended to replace advice given to you by your health care provider. Make sure you discuss any questions you have with your health care provider. Document Revised: 07/13/2017 Document Reviewed: 05/20/2016 Elsevier Patient Education  [place] 53-65 Years Old, Female Preventive care refers to lifestyle choices and visits with your health care provider that can promote health and wellness. This includes:  A yearly physical exam. This is also called an annual wellness visit.  Regular dental and eye exams.  Immunizations.  Screening for  certain conditions.  Healthy lifestyle choices, such as: ? Eating a healthy diet. ? Getting regular exercise. ? Not using drugs or products that contain nicotine and tobacco. ? Limiting alcohol use. What can I expect for my preventive care visit? Physical exam Your health care provider may check your:  Height and weight. These may be used to calculate your BMI (body mass index). BMI is a measurement that tells if you are at a healthy weight.  Heart rate and blood pressure.  Body temperature.  Skin for abnormal spots. Counseling Your health care provider may ask you questions about your:  Past medical problems.  Family's medical history.  Alcohol, tobacco, and drug use.  Emotional well-being.  Home life and relationship well-being.  Sexual activity.  Diet, exercise, and sleep habits.  Work and work Statistician.  Access to firearms.  Method of birth control.  Menstrual cycle.  Pregnancy history. What immunizations do I need? Vaccines are usually given at various ages, according to a schedule. Your health care provider will recommend vaccines for you based on your age, medical history, and lifestyle or other factors, such as travel or where you work.   What tests do I need? Blood tests  Lipid and cholesterol levels. These may be checked every 5 years starting at age 69.  Hepatitis C test.  Hepatitis B test. Screening  Diabetes screening. This is done by checking your blood sugar (glucose) after you have not eaten for a while (fasting).  STD (sexually transmitted disease) testing, if you are at risk.  BRCA-related cancer screening. This may be done if you have a family history of breast, ovarian, tubal, or peritoneal cancers.  Pelvic exam and Pap test. This may be done every 3 years starting at age 49. Starting at age 36, this may be done every 5 years if you have a Pap test in combination with an HPV test. Talk with your health care provider about your test  results, treatment options, and if necessary, the need for more tests.   Follow these instructions at home: Eating and drinking  Eat a healthy diet that includes fresh fruits and vegetables, whole grains, lean protein, and low-fat dairy products.  Take vitamin and mineral supplements as recommended by your health care provider.  Do not drink alcohol  if: ? Your health care provider tells you not to drink. ? You are pregnant, may be pregnant, or are planning to become pregnant.  If you drink alcohol: ? Limit how much you have to 0-1 drink a day. ? Be aware of how much alcohol is in your drink. In the [place] one drink equals one 12 oz bottle of beer (355 mL), one 5 oz glass of wine (148 mL), or one 1 oz glass of hard liquor (44 mL).   Lifestyle  Take daily care of your teeth and gums. Brush your teeth every morning and night with fluoride toothpaste. Floss one time each day.  Stay active. Exercise for at least 30 minutes 5 or more days each week.  Do not use any products that contain nicotine or tobacco, such as cigarettes, e-cigarettes, and chewing tobacco. If you need help quitting, ask your health care provider.  Do not use drugs.  If you are sexually active, practice safe sex. Use a condom or other form of protection to prevent STIs (sexually transmitted infections).  If you do not wish to become pregnant, use a form of birth control. If you plan to become pregnant, see your health care provider for a prepregnancy visit.  Find healthy ways to cope with stress, such as: ? Meditation, yoga, or listening to music. ? Journaling. ? Talking to a trusted person. ? Spending time with friends and family. Safety  Always wear your seat belt while driving or riding in a vehicle.  Do not drive: ? If you have been drinking alcohol. Do not ride with someone who has been drinking. ? When you are tired or distracted. ? While texting.  Wear a helmet and other protective equipment during  sports activities.  If you have firearms in your house, make sure you follow all gun safety procedures.  Seek help if you have been physically or sexually abused. What's next?  Go to your health care provider once a year for an annual wellness visit.  Ask your health care provider how often you should have your eyes and teeth checked.  Stay up to date on all vaccines. This information is not intended to replace advice given to you by your health care provider. Make sure you discuss any questions you have with your health care provider. Document Revised: 12/15/2019 Document Reviewed: 12/28/2017 Elsevier Patient Education  2021 [employer].

## 2020-05-15 ENCOUNTER — Encounter: Payer: BC Managed Care – PPO | Admitting: Certified Nurse Midwife

## 2021-02-12 ENCOUNTER — Encounter: Payer: BC Managed Care – PPO | Admitting: Certified Nurse Midwife

## 2021-04-04 ENCOUNTER — Telehealth: Payer: BC Managed Care – PPO | Admitting: Nurse Practitioner

## 2021-04-04 DIAGNOSIS — N3000 Acute cystitis without hematuria: Secondary | ICD-10-CM

## 2021-04-04 MED ORDER — NITROFURANTOIN MONOHYD MACRO 100 MG PO CAPS: 100.0000 mg | ORAL_CAPSULE | Freq: Two times a day (BID) | ORAL | 0 refills | Status: AC

## 2021-04-04 NOTE — Progress Notes (Signed)
E-Visit for Urinary Problems  We are sorry that you are not feeling well.  Here is how we plan to help!  Based on what you shared with me it looks like you most likely have a simple urinary tract infection.  A UTI (Urinary Tract Infection) is a bacterial infection of the bladder.  Most cases of urinary tract infections are simple to treat but a key part of your care is to encourage you to drink plenty of fluids and watch your symptoms carefully.  I have prescribed MacroBid 100 mg twice a day for 5 days.  Your symptoms should gradually improve. Call us if the burning in your urine worsens, you develop worsening fever, back pain or pelvic pain or if your symptoms do not resolve after completing the antibiotic.  Urinary tract infections can be prevented by drinking plenty of water to keep your body hydrated.  Also be sure when you wipe, wipe from front to back and don't hold it in!  If possible, empty your bladder every 4 hours.  I see that you are currently breastfeeding. Macrobid is safe according to the [employer] Ped[employer]ricts (AAP) for nursing infants. I have notified the pharmacist and you can also follow-up with the pharmacist with any questions.   HOME CARE Drink plenty of fluids Compete the full course of the antibiotics even if the symptoms resolve Remember, when you need to go.go. Holding in your urine can increase the likelihood of getting a UTI! GET HELP RIGHT AWAY IF: You cannot urinate You get a high fever Worsening back pain occurs You see blood in your urine You feel sick to your stomach or throw up You feel like you are going to pass out  MAKE SURE YOU  Understand these instructions. Will watch your condition. Will get help right away if you are not doing well or get worse.   Thank you for choosing an e-visit.  Your e-visit answers were reviewed by a board certified advanced clinical practitioner to complete your personal care plan. Depending upon the  condition, your plan could have included both over the counter or prescription medications.  Please review your pharmacy choice. Make sure the pharmacy is open so you can pick up prescription now. If there is a problem, you may contact your provider through [employer] and have the prescription routed to another pharmacy.  Your safety is important to [place]. If you have drug allergies check your prescription carefully.   For the next 24 hours you can use MyChart to ask questions about today's visit, request a non-urgent call back, or ask for a work or school excuse. You will get an email in the next two days asking about your experience. I hope that your e-visit has been valuable and will speed your recovery.   I have spent at least 5 minutes reviewing and documenting in the patient's chart.

## 2021-06-14 ENCOUNTER — Encounter: Payer: Self-pay | Admitting: Certified Nurse Midwife

## 2021-06-24 DIAGNOSIS — O039 Complete or unspecified spontaneous abortion without complication: Secondary | ICD-10-CM

## 2021-06-24 HISTORY — DX: Complete or unspecified spontaneous abortion without complication: O03.9

## 2021-06-30 ENCOUNTER — Telehealth: Payer: Self-pay | Admitting: Physician Assistant

## 2021-06-30 DIAGNOSIS — J039 Acute tonsillitis, unspecified: Secondary | ICD-10-CM

## 2021-06-30 DIAGNOSIS — Z20818 Contact with and (suspected) exposure to other bacterial communicable diseases: Secondary | ICD-10-CM

## 2021-06-30 MED ORDER — AZITHROMYCIN 250 MG PO TABS: ORAL_TABLET | ORAL | 0 refills | Status: AC

## 2021-06-30 NOTE — Progress Notes (Signed)

## 2021-06-30 NOTE — Progress Notes (Signed)
I have spent 5 minutes in review of e-visit questionnaire, review and updating patient chart, medical decision making and response to patient.   Ricca Melgarejo Cody Orvilla Truett, PA-C    

## 2021-09-13 ENCOUNTER — Telehealth: Payer: Self-pay | Admitting: Physician Assistant

## 2021-09-13 DIAGNOSIS — N898 Other specified noninflammatory disorders of vagina: Secondary | ICD-10-CM

## 2021-09-14 MED ORDER — FLUCONAZOLE 150 MG PO TABS: 150.0000 mg | ORAL_TABLET | Freq: Once | ORAL | 0 refills | Status: AC

## 2021-09-14 NOTE — Progress Notes (Signed)
I have spent 5 minutes in review of e-visit questionnaire, review and updating patient chart, medical decision making and response to patient.   Liberta Gimpel Cody Tenesia Escudero, PA-C    

## 2021-09-14 NOTE — Progress Notes (Signed)

## 2021-09-16 ENCOUNTER — Ambulatory Visit: Payer: Self-pay

## 2021-10-16 ENCOUNTER — Telehealth: Payer: Self-pay | Admitting: Nurse Practitioner

## 2021-10-16 DIAGNOSIS — B9689 Other specified bacterial agents as the cause of diseases classified elsewhere: Secondary | ICD-10-CM

## 2021-10-16 DIAGNOSIS — H109 Unspecified conjunctivitis: Secondary | ICD-10-CM

## 2021-10-16 MED ORDER — POLYMYXIN B-TRIMETHOPRIM 10000-0.1 UNIT/ML-% OP SOLN: 1.0000 [drp] | OPHTHALMIC | 0 refills | Status: DC

## 2021-10-16 NOTE — Progress Notes (Signed)
I have spent 5 minutes in review of e-visit questionnaire, review and updating patient chart, medical decision making and response to patient.  ° °Arvle Grabe W Braedyn Kauk, NP ° °  °

## 2021-10-16 NOTE — Progress Notes (Signed)

## 2022-03-04 ENCOUNTER — Other Ambulatory Visit: Payer: Self-pay

## 2022-04-13 ENCOUNTER — Encounter: Payer: Self-pay | Admitting: Obstetrics and Gynecology

## 2022-05-03 ENCOUNTER — Ambulatory Visit (INDEPENDENT_AMBULATORY_CARE_PROVIDER_SITE_OTHER): Payer: 59 | Admitting: Obstetrics and Gynecology

## 2022-05-03 ENCOUNTER — Other Ambulatory Visit (HOSPITAL_COMMUNITY)
Admission: RE | Admit: 2022-05-03 | Discharge: 2022-05-03 | Disposition: A | Discharge status: Home / Self Care | Payer: 59 | Source: Ambulatory Visit | Attending: Obstetrics and Gynecology | Admitting: Obstetrics and Gynecology

## 2022-05-03 ENCOUNTER — Encounter: Payer: Self-pay | Admitting: Obstetrics and Gynecology

## 2022-05-03 VITALS — BP 130/101 | HR 95 | Resp 16 | Ht 66.0 in | Wt 178.6 lb

## 2022-05-03 DIAGNOSIS — E663 Overweight: Secondary | ICD-10-CM

## 2022-05-03 DIAGNOSIS — Z01818 Encounter for other preprocedural examination: Secondary | ICD-10-CM

## 2022-05-03 DIAGNOSIS — Z124 Encounter for screening for malignant neoplasm of cervix: Secondary | ICD-10-CM | POA: Diagnosis present

## 2022-05-03 DIAGNOSIS — Z01419 Encounter for gynecological examination (general) (routine) without abnormal findings: Secondary | ICD-10-CM

## 2022-05-03 DIAGNOSIS — R030 Elevated blood-pressure reading, without diagnosis of hypertension: Secondary | ICD-10-CM

## 2022-05-03 NOTE — Progress Notes (Signed)
GYNECOLOGY ANNUAL PHYSICAL EXAM PROGRESS NOTE  Subjective:    Paige Brown is a 37 y.o. B2I2035 female who presents for an annual exam. The patient has no complaints today. The patient is sexually active. The patient participates in regular exercise: yes. Has the patient ever been transfused or tattooed?: no. The patient reports that there is not domestic violence in her life.   Patient desires medical clearance for elective procedure ("mommy makeover"), to be performed near the end of the month.  Has had labs completed recently at outpatient [facility] facility.   Menstrual History: Menarche age: 66 Patient's last menstrual period was 04/30/2022. Period Cycle (Days): 30 Period Duration (Days): 5 Period Pattern: Regular Menstrual Flow: Heavy Menstrual Control: Maxi pad, Tampon Menstrual Control Change Freq (Hours): 2 Dysmenorrhea: (!) Moderate Dysmenorrhea Symptoms: Cramping, Headache   Gynecologic History:  Contraception: vasectomy History of STI's: Denies Last Pap: 11/06/2018. Results were: normal.  Denies h/o abnormal pap smears.   OB History  Gravida Para Term Preterm AB Living  _0 0 1 5  SAB IAB Ectopic Multiple Live Births  1 0 0 0 5    # Outcome Date GA Lbr Len/2nd Weight Sex Delivery Anes PTL Lv  6 SAB 06/2021          5 Term 03/30/20 [redacted]w[redacted]d 7 lb 13.9 oz (3.57 kg) M Vag-Spont None  LIV     Name: Kratz,BOY Caydee     Apgar1: 8  Apgar5: 9  4 Term 03/16/18 [id number]:15 / 00:06 8 lb 0.8 oz (3.65 kg) M VBAC None  LIV     Name: Mansour,BOY Sharmila     Apgar1: 9  Apgar5: 9  3 Term 06/11/16 [id number] 6 lb 14 oz (3.118 kg) M Vag-Spont None  LIV     Name: Lampert,BOY Cassidi     Apgar1: 8  Apgar5: 9  2 Term 09/11/14 [id number] 5 oz (3.771 kg) M   Y LIV  1 Term 10/14/11 [id number] 10 oz (3.459 kg) F CS-Unspec  N LIV    Obstetric Comments  1st pregnancy-breech  2nd pregnancy: PTL at 35 wks./ VBAC- no assistance    Past Medical History:   Diagnosis Date   Anemia    with pregnancies   GERD (gastroesophageal reflux disease)    during pregnancy   UTI (lower urinary tract infection) 11/09/2015   2 more days to complete ATB    Past Surgical History:  Procedure Laterality Date   CESAREAN SECTION     TONSILLECTOMY  1994   and adnoids   WISDOM TOOTH EXTRACTION     2000    Family History  Problem Relation Age of Onset   Cancer Father        multiple Myeloma   Rheum arthritis Maternal Grandmother    Osteoarthritis Maternal Grandmother    Breast cancer Maternal Grandmother 60   Thyroid disease Maternal Grandmother    Heart failure Maternal Grandfather    Hyperlipidemia Maternal Grandfather    Hypertension Maternal Grandfather    Asthma Paternal Grandmother    Stroke Paternal Grandfather    Rheum arthritis Maternal Aunt    Rheum arthritis Maternal Uncle    Ovarian cancer Neg Hx    Colon cancer Neg Hx     Social History   Socioeconomic History   Marital status: Married    Spouse name: Not on file   Number of children: Not on file   Years  of education: Not on file   Highest education level: Not on file  Occupational History   Occupation: Programmer, 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  Tobacco Use   Smoking status: Never   Smokeless tobacco: Never  Vaping Use   Vaping Use: Never used  Substance and Sexual Activity   Alcohol use: Not Currently    Alcohol/week: 0.0 standard drinks of alcohol   Drug use: No   Sexual activity: Yes    Partners: Male  Other Topics Concern   Not on file  Social History Narrative   Not on file   Social Determinants of Health   Financial Resource Strain: Not on file  Food Insecurity: Not on file  Transportation Needs: Not on file  Physical Activity: Not on file  Stress: Not on file  Social Connections: Not on file  Intimate Partner Violence: Not on file    Current Outpatient Medications on File Prior to Visit  Medication Sig Dispense Refill   acetaminophen (TYLENOL)  325 MG tablet Take 2 tablets (650 mg total) by mouth every 4 (four) hours as needed (for pain scale < 4). 60 tablet 0   docusate sodium (COLACE) 100 MG capsule Take 100 mg by mouth 2 (two) times daily.     Prenatal Vit-Fe Fumarate-FA (PRENATAL MULTIVITAMIN) TABS tablet Take 1 tablet by mouth daily at 12 noon.     trimethoprim-polymyxin b (POLYTRIM) ophthalmic solution Place 1 drop into both eyes every 4 (four) hours. 10 mL 0   No current facility-administered medications on file prior to visit.    Allergies  Allergen Reactions   Ciprofloxacin Nausea And Vomiting   Penicillins Hives   Shellfish Allergy Swelling   Cefaclor Hives   Levaquin [Levofloxacin]      Review of Systems Constitutional: negative for chills, fatigue, fevers and sweats Eyes: negative for irritation, redness and visual disturbance Ears, nose, mouth, throat, and face: negative for hearing loss, nasal congestion, snoring and tinnitus Respiratory: negative for asthma, cough, sputum Cardiovascular: negative for chest pain, dyspnea, exertional chest pressure/discomfort, irregular heart beat, palpitations and syncope Gastrointestinal: negative for abdominal pain, change in bowel habits, nausea and vomiting Genitourinary: negative for abnormal menstrual periods, genital lesions, sexual problems and vaginal discharge, dysuria and urinary incontinence Integument/breast: negative for breast lump, breast tenderness and nipple discharge Hematologic/lymphatic: negative for bleeding and easy bruising Musculoskeletal:negative for back pain and muscle weakness Neurological: negative for dizziness, headaches, vertigo and weakness Endocrine: negative for diabetic symptoms including polydipsia, polyuria and skin dryness Allergic/Immunologic: negative for hay fever and urticaria      Objective:  Blood pressure (!) 130/101, pulse 95, resp. rate 16, height _0  (1.676 m), weight 178 lb 9.6 oz (81 kg), last menstrual period 04/30/2022,  not currently breastfeeding. Initial BP 140/95.  Body mass index is 28.83 kg/m.    General Appearance:    Alert, cooperative, no distress, appears stated age, overweight  Head:    Normocephalic, without obvious abnormality, atraumatic  Eyes:    PERRL, conjunctiva/corneas clear, EOM's intact, both eyes  Ears:    Normal external ear canals, both ears  Nose:   Nares normal, septum midline, mucosa normal, no drainage or sinus tenderness  Throat:   Lips, mucosa, and tongue normal; teeth and gums normal  Neck:   Supple, symmetrical, trachea midline, no adenopathy; thyroid: no enlargement/tenderness/nodules; no carotid bruit or JVD  Back:     Symmetric, no curvature, ROM normal, no CVA tenderness  Lungs:  Clear to auscultation bilaterally, respirations unlabored  Chest Wall:    No tenderness or deformity   Heart:    Regular rate and rhythm, S1 and S2 normal, no murmur, rub or gallop  Breast Exam:    No tenderness, masses, or nipple abnormality  Abdomen:     Soft, non-tender, bowel sounds active all four quadrants, no masses, no organomegaly.    Genitalia:    Pelvic:external genitalia normal, vagina without lesions, discharge, or tenderness, rectovaginal septum  normal. Cervix normal in appearance, no cervical motion tenderness, no adnexal masses or tenderness.  Uterus normal size, shape, mobile, regular contours, nontender.  Rectal:    Normal external sphincter.  No hemorrhoids appreciated. Internal exam not done.   Extremities:   Extremities normal, atraumatic, no cyanosis or edema  Pulses:   2+ and symmetric all extremities  Skin:   Skin color, texture, turgor normal, no rashes or lesions  Lymph nodes:   Cervical, supraclavicular, and axillary nodes normal  Neurologic:   CNII-XII intact, normal strength, sensation and reflexes throughout   .  Labs:  Lab Results  Component Value Date   WBC 8.7 03/31/2020   HGB 8.9 (L) 03/31/2020   HCT 27.6 (L) 03/31/2020   MCV 92.0 03/31/2020   PLT 128  (L) 03/31/2020    Lab Results  Component Value Date   CREATININE 0.82 03/31/2020   BUN 14 03/31/2020   NA 138 03/31/2020   K 4.0 03/31/2020   CL 107 03/31/2020   CO2 23 03/31/2020    Lab Results  Component Value Date   ALT 11 03/31/2020   AST 15 03/31/2020   ALKPHOS 72 03/31/2020   BILITOT 0.4 03/31/2020    Lab Results  Component Value Date   TSH 1.070 04/08/2016     Assessment:   1. Encounter for well woman exam with routine gynecological exam   2. Cervical cancer screening   3. Preoperative clearance   4. Elevated BP without diagnosis of hypertension   5. Overweight (BMI 25.0-29.9)      Plan:  - Blood tests: UTD. - Breast self exam technique reviewed and patient encouraged to perform self-exam monthly. - Contraception: vasectomy. - Discussed healthy lifestyle modifications. - Mammogram  : Not age appropriate . To begin screens at age 77.  - Pap smear ordered. - COVID vaccination status: Declines - Flu vaccine: Declines - Elevated BP noted today. No prior history of HTN on review of chart. Advised on f/u after procedure if BP still noted to be elevated.  - Follow up in 1 year for annual exam   Rubie Maid, MD [place]

## 2022-05-09 LAB — CYTOLOGY - PAP
Comment: NEGATIVE
Diagnosis: NEGATIVE
Diagnosis: REACTIVE
High risk HPV: NEGATIVE

## 2023-01-29 ENCOUNTER — Ambulatory Visit
Admission: EM | Admit: 2023-01-29 | Discharge: 2023-01-29 | Disposition: A | Discharge status: Home / Self Care | Payer: 59 | Attending: Emergency Medicine | Admitting: Emergency Medicine

## 2023-01-29 ENCOUNTER — Other Ambulatory Visit: Payer: Self-pay

## 2023-01-29 ENCOUNTER — Encounter: Payer: Self-pay | Admitting: Emergency Medicine

## 2023-01-29 DIAGNOSIS — M545 Low back pain, unspecified: Secondary | ICD-10-CM

## 2023-01-29 MED ORDER — KETOROLAC TROMETHAMINE 30 MG/ML IJ SOLN
30.0000 mg | Freq: Once | INTRAMUSCULAR | Status: AC
  Administered 2023-01-29: 30 mg via INTRAMUSCULAR

## 2023-01-29 MED ORDER — CYCLOBENZAPRINE HCL 5 MG PO TABS: 5.0000 mg | ORAL_TABLET | Freq: Three times a day (TID) | ORAL | 0 refills | Status: DC | PRN

## 2023-01-29 NOTE — ED Triage Notes (Signed)
No known injury.  Patient has history of back issues and has been seen by chiropractor.  Seen by a [place] chiropractor today, but unable to adjust lower back. Instructed patient that she needed a muscle relaxer

## 2023-01-29 NOTE — Discharge Instructions (Signed)
Your pain is most likely caused by irritation to the muscles .  Given injection of Toradol to help reduce inflammation and pain, daily see improvement within 30 minutes to an hour, may use Tylenol and muscle relaxant for remainder of day  Starting tomorrow you may use ibuprofen 600- 800 mg every 6-8 hours to help continue process above, take consistently for most effective results  May use muscle relaxant every 8 hours, please be mindful this may make you feel sleepy  You may use heating pad in 15 minute intervals as needed for additional comfort, within the first 2-3 days you may find comfort in using ice in 10-15 minutes over affected area  Begin massaging and stretching affected area daily for 10 minutes as tolerated to further loosen muscles   When sitting and lying down place pillow underneath and between knees for support  Practice good posture: head back, shoulders back, chest forward, pelvis back and weight distributed evenly on both legs  If pain persist after recommended treatment or reoccurs if may be beneficial to follow up with orthopedic specialist for evaluation, this doctor specializes in the bones and can manage your symptoms long-term with options such as but not limited to imaging, medications or physical therapy

## 2023-01-29 NOTE — ED Provider Notes (Signed)
Renaldo Fiddler    CSN: [id number] Arrival date & time: 01/29/23  1056      History   Chief Complaint No chief complaint on file.   HPI Paige Brown is a 37 y.o. female.   Patient presents for evaluation of centralized low back pain beginning 1 day ago without precipitating event, injury or trauma.  Believes she may have slept wrong.  Pain has occurred before in the same area without injury, typically relieved with over-the-counter analgesics and chiropractor.  Attempted to be seen by chiropractor today but due to stiffness was unable to complete treatment, was recommended to be seen in person and attempt to get muscle relaxant so that she is able to be adjusted.  Pain does radiate into the buttocks and the hips.  Denies presence of numbness or tingling, urinary or bowel incontinence.  Past Medical History:  Diagnosis Date   Anemia    with pregnancies   GERD (gastroesophageal reflux disease)    during pregnancy   Miscarriage 06/24/2021   UTI (lower urinary tract infection) 11/09/2015   2 more days to complete ATB    Patient Active Problem List   Diagnosis Date Noted   History of recurrent UTIs 04/20/2018   Temporary low platelet count (HCC) 04/12/2016   History of vaginal delivery following previous cesarean delivery 01/05/2016    Past Surgical History:  Procedure Laterality Date   CESAREAN SECTION     TONSILLECTOMY  1994   and adnoids   WISDOM TOOTH EXTRACTION     2000    OB History     Gravida  6   Para  5   Term  5   Preterm      AB  1   Living  5      SAB  1   IAB      Ectopic      Multiple  0   Live Births  5        Obstetric Comments  1st pregnancy-breech 2nd pregnancy: PTL at 35 wks./ VBAC- no assistance           Home Medications    Prior to Admission medications   Medication Sig Start Date End Date Taking? Authorizing Provider  cyclobenzaprine (FLEXERIL) 5 MG tablet Take 1 tablet (5 mg total) by mouth 3  (three) times daily as needed for muscle spasms. 01/29/23  Yes Cloyd Ragas R, NP  ranitidine (ZANTAC) 150 MG capsule Take 150 mg by mouth 2 (two) times daily. Patient not taking: Reported on 01/29/2023    [provider]  TRANSDERM-SCOP 1 MG/3DAYS Place 1 patch onto the skin every 3 (three) days. 04/20/22   [provider]  XARELTO 10 MG TABS tablet Take 10 mg by mouth daily. Patient not taking: Reported on 01/29/2023 04/30/22   [provider]    Family History Family History  Problem Relation Age of Onset   Cancer Father        multiple Myeloma   Rheum arthritis Maternal Grandmother    Osteoarthritis Maternal Grandmother    Breast cancer Maternal Grandmother 35   Thyroid disease Maternal Grandmother    Heart failure Maternal Grandfather    Hyperlipidemia Maternal Grandfather    Hypertension Maternal Grandfather    Asthma Paternal Grandmother    Stroke Paternal Grandfather    Rheum arthritis Maternal Aunt    Rheum arthritis Maternal Uncle    Ovarian cancer Neg Hx    Colon cancer Neg Hx  Social History Social History   Tobacco Use   Smoking status: Never   Smokeless tobacco: Never  Vaping Use   Vaping status: Never Used  Substance Use Topics   Alcohol use: Not Currently    Comment: rare   Drug use: No     Allergies   Ciprofloxacin, Penicillins, Shellfish allergy, Cefaclor, and Levaquin [levofloxacin]   Review of Systems Review of Systems   Physical Exam Triage Vital Signs ED Triage Vitals  Encounter Vitals Group     BP 01/29/23 1147 111/80     Systolic BP Percentile --      Diastolic BP Percentile --      Pulse Rate 01/29/23 1147 (!) 123     Resp 01/29/23 1147 18     Temp 01/29/23 1147 99.4 F (37.4 C)     Temp Source 01/29/23 1147 Oral     SpO2 01/29/23 1147 100 %     Weight --      Height --      Head Circumference --      Peak Flow --      Pain Score 01/29/23 1152 9     Pain Loc --      Pain Education --       Exclude from Growth Chart --    No data found.  Updated Vital Signs BP 111/80 (BP Location: Right Arm)   Pulse (!) 123   Temp 99.4 F (37.4 C) (Oral)   Resp 18   LMP 01/12/2023 (Exact Date)   SpO2 100%   Visual Acuity Right Eye Distance:   Left Eye Distance:   Bilateral Distance:    Right Eye Near:   Left Eye Near:    Bilateral Near:     Physical Exam Constitutional:      Appearance: Normal appearance.  Eyes:     Extraocular Movements: Extraocular movements intact.  Pulmonary:     Effort: Pulmonary effort is normal.  Musculoskeletal:     Comments: Unable to reproduce tenderness, no ecchymosis swelling or deformity on exam, visibly uncomfortable while sitting erect during exam, limitations on movement, pain elicited with twisting turning bending  Neurological:     Mental Status: She is alert and oriented to person, place, and time. Mental status is at baseline.      UC Treatments / Results  Labs (all labs ordered are listed, but only abnormal results are displayed) Labs Reviewed - No data to display  EKG   Radiology No results found.  Procedures Procedures (including critical care time)  Medications Ordered in UC Medications  ketorolac (TORADOL) 30 MG/ML injection 30 mg (has no administration in time range)    Initial Impression / Assessment and Plan / UC Course  I have reviewed the triage vital signs and the nursing notes.  Pertinent labs & imaging results that were available during my care of the patient were reviewed by me and considered in my medical decision making (see chart for details).  Lumbar back pain  Etiology most likely muscular, denies injury therefore imaging deferred, Toradol injection given, declined prescription for prednisone, prescribed Flexeril for outpatient use and discussed administration, recommended consistent use of NSAIDs for further supportive care, may follow-up with her chiropractor, recommended RICE, heat massage  stretching with activity as tolerated Final Clinical Impressions(s) / UC Diagnoses   Final diagnoses:  Lumbar back pain     Discharge Instructions      Your pain is most likely caused by irritation to the muscles .  Given injection of Toradol to help reduce inflammation and pain, daily see improvement within 30 minutes to an hour, may use Tylenol and muscle relaxant for remainder of day  Starting tomorrow you may use ibuprofen 600- 800 mg every 6-8 hours to help continue process above, take consistently for most effective results  May use muscle relaxant every 8 hours, please be mindful this may make you feel sleepy  You may use heating pad in 15 minute intervals as needed for additional comfort, within the first 2-3 days you may find comfort in using ice in 10-15 minutes over affected area  Begin massaging and stretching affected area daily for 10 minutes as tolerated to further loosen muscles   When sitting and lying down place pillow underneath and between knees for support  Practice good posture: head back, shoulders back, chest forward, pelvis back and weight distributed evenly on both legs  If pain persist after recommended treatment or reoccurs if may be beneficial to follow up with orthopedic specialist for evaluation, this doctor specializes in the bones and can manage your symptoms long-term with options such as but not limited to imaging, medications or physical therapy      ED Prescriptions     Medication Sig Dispense Auth. Provider   cyclobenzaprine (FLEXERIL) 5 MG tablet Take 1 tablet (5 mg total) by mouth 3 (three) times daily as needed for muscle spasms. 30 tablet Valinda Hoar, NP      PDMP not reviewed this encounter.   Valinda Hoar, NP 01/29/23 1220

## 2023-04-22 ENCOUNTER — Telehealth: Payer: 59

## 2023-05-14 ENCOUNTER — Telehealth: Payer: 59 | Admitting: Family

## 2023-05-14 DIAGNOSIS — R399 Unspecified symptoms and signs involving the genitourinary system: Secondary | ICD-10-CM | POA: Diagnosis not present

## 2023-05-14 MED ORDER — SULFAMETHOXAZOLE-TRIMETHOPRIM 800-160 MG PO TABS: 1.0000 | ORAL_TABLET | Freq: Two times a day (BID) | ORAL | 0 refills | Status: DC

## 2023-05-14 NOTE — Progress Notes (Signed)
E-Visit for Urinary Problems  We are sorry that you are not feeling well.  Here is how we plan to help!  Based on what you shared with me it looks like you most likely have a simple urinary tract infection.  A UTI (Urinary Tract Infection) is a bacterial infection of the bladder.  Most cases of urinary tract infections are simple to treat but a key part of your care is to encourage you to drink plenty of fluids and watch your symptoms carefully.  I have prescribed Bactrim DS One tablet twice a day for 5 days.  Your symptoms should gradually improve. Call us if the burning in your urine worsens, you develop worsening fever, back pain or pelvic pain or if your symptoms do not resolve after completing the antibiotic.  Urinary tract infections can be prevented by drinking plenty of water to keep your body hydrated.  Also be sure when you wipe, wipe from front to back and don't hold it in!  If possible, empty your bladder every 4 hours.  HOME CARE Drink plenty of fluids Compete the full course of the antibiotics even if the symptoms resolve Remember, when you need to go.go. Holding in your urine can increase the likelihood of getting a UTI! GET HELP RIGHT AWAY IF: You cannot urinate You get a high fever Worsening back pain occurs You see blood in your urine You feel sick to your stomach or throw up You feel like you are going to pass out  MAKE SURE YOU  Understand these instructions. Will watch your condition. Will get help right away if you are not doing well or get worse.   Thank you for choosing an e-visit.  Your e-visit answers were reviewed by a board certified advanced clinical practitioner to complete your personal care plan. Depending upon the condition, your plan could have included both over the counter or prescription medications.  Please review your pharmacy choice. Make sure the pharmacy is open so you can pick up prescription now. If there is a problem, you may contact  your provider through [employer] and have the prescription routed to another pharmacy.  Your safety is important to [place]. If you have drug allergies check your prescription carefully.   For the next 24 hours you can use MyChart to ask questions about today's visit, request a non-urgent call back, or ask for a work or school excuse. You will get an email in the next two days asking about your experience. I hope that your e-visit has been valuable and will speed your recovery.  Approximately 5 minutes was spent documenting and reviewing patient's chart.

## 2024-02-05 ENCOUNTER — Ambulatory Visit (INDEPENDENT_AMBULATORY_CARE_PROVIDER_SITE_OTHER): Admitting: Family Medicine

## 2024-02-05 VITALS — BP 122/82 | HR 110 | Resp 16 | Ht 66.0 in | Wt 162.2 lb

## 2024-02-05 DIAGNOSIS — Z7689 Persons encountering health services in other specified circumstances: Secondary | ICD-10-CM

## 2024-02-05 DIAGNOSIS — Z0000 Encounter for general adult medical examination without abnormal findings: Secondary | ICD-10-CM

## 2024-02-05 DIAGNOSIS — Z23 Encounter for immunization: Secondary | ICD-10-CM

## 2024-02-05 DIAGNOSIS — R6882 Decreased libido: Secondary | ICD-10-CM

## 2024-02-05 NOTE — Patient Instructions (Signed)

## 2024-02-05 NOTE — Progress Notes (Signed)
 New Patient Office Visit  Subjective   Patient ID: Paige Brown, female    DOB: Nov 28, 1985  Age: 38 y.o. MRN: [id number]  CC:  Chief Complaint  Patient presents with   Establish Care   Discussed the use of AI scribe software for clinical note transcription with the patient, who gave verbal consent to proceed.  History of Present Illness   Paige Brown is a 38 year old female who presents to establish primary care with [facility] and for routine health maintenance.  She has not had a primary care provider for a long time, as she was primarily seeing an OBGYN during her pregnancies. She is now done having children and wishes to establish care with a primary care provider. She has no specific concerns but wants to ensure her overall health is good, including checking her thyroid , hormone levels, and cholesterol. She has not had blood work done in four to five years.  She reports a low sex drive, which she finds concerning. She is interested in checking her hormone levels to see if there is an underlying issue. Her mother went into menopause in her mid-forties, and she is aware that she is approaching the age where hormonal changes may occur.  She is not currently taking any medications regularly. She occasionally takes Advil  for headaches and used to take heartburn medication frequently, but after a tummy tuck two years ago, she no longer needs it.    Outpatient Encounter Medications as of 02/05/2024  Medication Sig   [DISCONTINUED] cyclobenzaprine  (FLEXERIL ) 5 MG tablet Take 1 tablet (5 mg total) by mouth 3 (three) times daily as needed for muscle spasms. (Patient not taking: Reported on 02/05/2024)   [DISCONTINUED] ranitidine (ZANTAC) 150 MG capsule Take 150 mg by mouth 2 (two) times daily. (Patient not taking: Reported on 02/05/2024)   [DISCONTINUED] sulfamethoxazole -trimethoprim  (BACTRIM  DS) 800-160 MG tablet Take 1 tablet by mouth 2 (two) times daily.    [DISCONTINUED] TRANSDERM-SCOP 1 MG/3DAYS Place 1 patch onto the skin every 3 (three) days.   [DISCONTINUED] XARELTO 10 MG TABS tablet Take 10 mg by mouth daily. (Patient not taking: Reported on 01/29/2023)   No facility-administered encounter medications on file as of 02/05/2024.    Patient Active Problem List   Diagnosis Date Noted   History of recurrent UTIs 04/20/2018   Temporary low platelet count 04/12/2016   History of vaginal delivery following previous cesarean delivery 01/05/2016   Past Medical History:  Diagnosis Date   Allergy    Anemia    with pregnancies   GERD (gastroesophageal reflux disease)    during pregnancy   Miscarriage 06/24/2021   UTI (lower urinary tract infection) 11/09/2015   2 more days to complete ATB   Past Surgical History:  Procedure Laterality Date   BREAST SURGERY  1/24 & 10/24   Fat graft then impants   CESAREAN SECTION     COSMETIC SURGERY  05/2022   TONSILLECTOMY  05/02/1992   and adnoids   WISDOM TOOTH EXTRACTION     2000   Family History  Problem Relation Age of Onset   Cancer Father        multiple Myeloma   Rheum arthritis Maternal Grandmother    Osteoarthritis Maternal Grandmother    Breast cancer Maternal Grandmother 60   Thyroid  disease Maternal Grandmother    Heart failure Maternal Grandfather    Hyperlipidemia Maternal Grandfather    Hypertension Maternal Grandfather    Asthma Paternal Grandmother  Stroke Paternal Grandfather    Rheum arthritis Maternal Aunt    Rheum arthritis Maternal Uncle    Ovarian cancer Neg Hx    Colon cancer Neg Hx    Social History   Socioeconomic History   Marital status: Married    Spouse name: Not on file   Number of children: Not on file   Years of education: Not on file   Highest education level: Bachelor's degree (e.g., BA, AB, BS)  Occupational History   Occupation: Teacher, adult education:     Comment: ER  Tobacco Use   Smoking status: Never   Smokeless tobacco: Never   Vaping Use   Vaping status: Never Used  Substance and Sexual Activity   Alcohol use: Yes    Comment: Rarelt   Drug use: No   Sexual activity: Yes    Partners: Male    Birth control/protection: Surgical    Comment: vasectomy  Other Topics Concern   Not on file  Social History Narrative   Not on file   Social Drivers of Health   Financial Resource Strain: Low Risk  (02/05/2024)   Overall Financial Resource Strain (CARDIA)    Difficulty of Paying Living Expenses: Not hard at all  Food Insecurity: No Food Insecurity (02/05/2024)   Hunger Vital Sign    Worried About Running Out of Food in the Last Year: Never true    Ran Out of Food in the Last Year: Never true  Transportation Needs: No Transportation Needs (02/05/2024)   PRAPARE - Administrator, Civil Service (Medical): No    Lack of Transportation (Non-Medical): No  Physical Activity: Insufficiently Active (02/05/2024)   Exercise Vital Sign    Days of Exercise per Week: 3 days    Minutes of Exercise per Session: 40 min  Stress: No Stress Concern Present (02/05/2024)   [employer] of Occupational Health - Occupational Stress Questionnaire    Feeling of Stress: Not at all  Social Connections: Socially Integrated (02/05/2024)   Social Connection and Isolation Panel    Frequency of Communication with Friends and Family: More than three times a week    Frequency of Social Gatherings with Friends and Family: More than three times a week    Attends Religious Services: More than 4 times per year    Active Member of [employer] or Organizations: Yes    Attends Engineer, structural: More than 4 times per year    Marital Status: Married  Catering manager Violence: Not on file   Outpatient Medications Prior to Visit  Medication Sig Dispense Refill   cyclobenzaprine  (FLEXERIL ) 5 MG tablet Take 1 tablet (5 mg total) by mouth 3 (three) times daily as needed for muscle spasms. (Patient not taking: Reported on 02/05/2024)  30 tablet 0   ranitidine (ZANTAC) 150 MG capsule Take 150 mg by mouth 2 (two) times daily. (Patient not taking: Reported on 02/05/2024)     sulfamethoxazole -trimethoprim  (BACTRIM  DS) 800-160 MG tablet Take 1 tablet by mouth 2 (two) times daily. 10 tablet 0   TRANSDERM-SCOP 1 MG/3DAYS Place 1 patch onto the skin every 3 (three) days.     XARELTO 10 MG TABS tablet Take 10 mg by mouth daily. (Patient not taking: Reported on 01/29/2023)     No facility-administered medications prior to visit.   Allergies  Allergen Reactions   Ciprofloxacin Nausea And Vomiting   Penicillins Hives   Shellfish Allergy Swelling   Cefaclor Hives   Levaquin  [Levofloxacin ]  ROS: see HPI     Objective  Today's Vitals   02/05/24 1058  BP: 122/82  Pulse: (!) 110  Resp: 16  SpO2: 97%  Weight: 162 lb 3.2 oz (73.6 kg)  Height: 5' 6 (1.676 m)  PainSc: 0-No pain    GENERAL: Well-appearing, in NAD. Well nourished.  SKIN: Pink, warm and dry. No rash, lesion, ulceration, or ecchymoses.  Head: Normocephalic. NECK: Trachea midline. Full ROM w/o pain or tenderness. No lymphadenopathy.  EARS: Tympanic membranes are intact, translucent without bulging and without drainage. Appropriate landmarks visualized.  EYES: Conjunctiva clear without exudates. EOMI, PERRL, no drainage present.  NOSE: Septum midline w/o deformity. Nares patent, mucosa pink and non-inflamed w/o drainage. No sinus tenderness.  THROAT: Uvula midline. Oropharynx clear. Tonsils non-inflamed without exudate. Mucous membranes pink and moist.  RESPIRATORY: Chest wall symmetrical. Respirations even and non-labored. Breath sounds clear to auscultation bilaterally.  CARDIAC: S1, S2 present, regular rate and rhythm without murmur or gallops. Peripheral pulses 2+ bilaterally.  MSK: Muscle tone and strength appropriate for age. Joints w/o tenderness, redness, or swelling.  EXTREMITIES: Without clubbing, cyanosis, or edema.  NEUROLOGIC: No motor or sensory  deficits. Steady, even gait. C2-C12 intact.  PSYCH/MENTAL STATUS: Alert, oriented x 3. Cooperative, appropriate mood and affect.      Assessment & Plan:   1. Encounter to establish care (Primary) Patient is a 89- year-old female who presents today to establish care with primary care at [facility]. Reviewed the past medical history, family history, social history, surgical history, medications and allergies today- updates made as indicated. Patient has concerns today about updating labs.   2. Decreased libido he expressed concern about low libido and requested hormone level evaluation. Family history suggests menopause in mid-forties. Order blood work including thyroid  function tests, hormone levels (FSH, LH, estrogen, progesterone), and cholesterol. - FSH/LH - Estradiol - 17-Hydroxyprogesterone - Testosterone, Free, Total, SHBG  3. Encounter for immunization Patient is agreeable to receive influenza immunization today.  - Flu vaccine, recombinant, trivalent, inj  5. Healthcare maintenance She is establishing care after a long period without a primary care provider. Order complete blood count and metabolic panel to assess kidney, liver, and electrolyte function. - CBC with Differential/Platelet - Comprehensive metabolic panel with GFR - Hemoglobin A1c - Lipid panel - TSH Rfx on Abnormal to Free T4     Return in about 6 weeks (around 03/18/2024) for Physical.   Evalene Arts, FNP

## 2024-02-06 ENCOUNTER — Ambulatory Visit: Payer: Self-pay

## 2024-02-06 NOTE — Telephone Encounter (Signed)
 FYI Only or Action Required?: FYI only for provider.  Patient was last seen in primary care on 02/05/2024 by Towana Small, FNP.  Called Nurse Triage reporting Abdominal Pain.  Symptoms began yesterday.  Interventions attempted: Nothing.  Symptoms are: stable.  Triage Disposition: Home Care  Patient/caregiver understands and will follow disposition?: Yes Reason for Disposition  [1] MILD-MODERATE pain AND [2] constant and [3] present < 2 hours  Answer Assessment - Initial Assessment Questions States movement makes the pain worse. Patient got first flu shot in 4 years. Patient worked up stating she has no time to be sick, has a busy week, can't come to the appointment PCP has open tomorrow. Advised Home care and if anything persists or worsens to head to ED.   1. LOCATION: Where does it hurt?      Whole lower abdomen  2. RADIATION: Does the pain shoot anywhere else? (e.g., chest, back)     Radiating into back a little  3. ONSET: When did the pain begin? (e.g., minutes, hours or days ago)      Woke up out of sleep last night  4. SUDDEN: Gradual or sudden onset?     Sudden, woke up in the middle of the night from the pain  5. PATTERN Does the pain come and go, or is it constant?     Constant   6. SEVERITY: How bad is the pain?  (e.g., Scale 1-10; mild, moderate, or severe)     8/10-9/10  7. RECURRENT SYMPTOM: Have you ever had this type of stomach pain before? If Yes, ask: When was the last time? and What happened that time?      No  8. CAUSE: What do you think is causing the stomach pain? (e.g., gallstones, recent abdominal surgery)     Flu shot  9. RELIEVING/AGGRAVATING FACTORS: What makes it better or worse? (e.g., antacids, bending or twisting motion, bowel movement)     Laying in fetal position  10. OTHER SYMPTOMS: Do you have any other symptoms? (e.g., back pain, diarrhea, fever, urination pain, vomiting)       Diarrhea once this morning,  vomited twice, once last night and again this morning.  Protocols used: Abdominal Pain - [facility]  Copied from CRM [id number]. Topic: Clinical - Red Word Triage >> Feb 06, 2024  2:40 PM Carla L wrote: Red Word that prompted transfer to Nurse Triage: reaction to flu shot - stomach pains, pretty agonizing unable to keep anything down

## 2024-02-08 LAB — CBC WITH DIFFERENTIAL/PLATELET
Basophils Absolute: 0 x10E3/uL (ref 0.0–0.2)
Basos: 1 %
EOS (ABSOLUTE): 0.1 x10E3/uL (ref 0.0–0.4)
Eos: 1 %
Hematocrit: 39.9 % (ref 34.0–46.6)
Hemoglobin: 13.2 g/dL (ref 11.1–15.9)
Immature Grans (Abs): 0 x10E3/uL (ref 0.0–0.1)
Immature Granulocytes: 0 %
Lymphocytes Absolute: 1.4 x10E3/uL (ref 0.7–3.1)
Lymphs: 23 %
MCH: 30.5 pg (ref 26.6–33.0)
MCHC: 33.1 g/dL (ref 31.5–35.7)
MCV: 92 fL (ref 79–97)
Monocytes Absolute: 0.3 x10E3/uL (ref 0.1–0.9)
Monocytes: 5 %
Neutrophils Absolute: 4 x10E3/uL (ref 1.4–7.0)
Neutrophils: 70 %
Platelets: 173 x10E3/uL (ref 150–450)
RBC: 4.33 x10E6/uL (ref 3.77–5.28)
RDW: 12 % (ref 11.7–15.4)
WBC: 5.8 x10E3/uL (ref 3.4–10.8)

## 2024-02-08 LAB — LIPID PANEL
Chol/HDL Ratio: 3.3 ratio (ref 0.0–4.4)
Cholesterol, Total: 140 mg/dL (ref 100–199)
HDL: 42 mg/dL (ref >39)
LDL Chol Calc (NIH): 84 mg/dL (ref 0–99)
Triglycerides: 72 mg/dL (ref 0–149)
VLDL Cholesterol Cal: 14 mg/dL (ref 5–40)

## 2024-02-08 LAB — TESTOSTERONE, FREE, TOTAL, SHBG
Sex Hormone Binding: 75.8 nmol/L (ref 24.6–122.0)
Testosterone, Free: 0.2 pg/mL (ref 0.0–4.2)
Testosterone: 6 ng/dL — ABNORMAL LOW (ref 8–60)

## 2024-02-08 LAB — COMPREHENSIVE METABOLIC PANEL WITH GFR
ALT: 13 IU/L (ref 0–32)
AST: 17 IU/L (ref 0–40)
Albumin: 4.5 g/dL (ref 3.9–4.9)
Alkaline Phosphatase: 56 IU/L (ref 41–116)
BUN/Creatinine Ratio: 14 (ref 9–23)
BUN: 10 mg/dL (ref 6–20)
Bilirubin Total: 0.4 mg/dL (ref 0.0–1.2)
CO2: 24 mmol/L (ref 20–29)
Calcium: 9.2 mg/dL (ref 8.7–10.2)
Chloride: 104 mmol/L (ref 96–106)
Creatinine, Ser: 0.7 mg/dL (ref 0.57–1.00)
Globulin, Total: 2.2 g/dL (ref 1.5–4.5)
Glucose: 77 mg/dL (ref 70–99)
Potassium: 3.7 mmol/L (ref 3.5–5.2)
Sodium: 141 mmol/L (ref 134–144)
Total Protein: 6.7 g/dL (ref 6.0–8.5)
eGFR: 115 mL/min/1.73 (ref >59)

## 2024-02-08 LAB — TSH RFX ON ABNORMAL TO FREE T4: TSH: 0.62 u[IU]/mL (ref 0.450–4.500)

## 2024-02-08 LAB — FSH/LH
FSH: 2.9 m[IU]/mL
LH: 4.2 m[IU]/mL

## 2024-02-08 LAB — 17-HYDROXYPROGESTERONE: 17-OH Progesterone LCMS: 149 ng/dL

## 2024-02-08 LAB — HEMOGLOBIN A1C
Est. average glucose Bld gHb Est-mCnc: 82 mg/dL
Hgb A1c MFr Bld: 4.5 % — ABNORMAL LOW (ref 4.8–5.6)

## 2024-02-08 LAB — ESTRADIOL: Estradiol: 93 pg/mL

## 2024-02-09 ENCOUNTER — Ambulatory Visit: Payer: Self-pay | Admitting: Family Medicine

## 2024-03-18 ENCOUNTER — Encounter: Payer: Self-pay | Admitting: Family Medicine

## 2024-03-18 ENCOUNTER — Ambulatory Visit: Admitting: Family Medicine

## 2024-03-18 ENCOUNTER — Telehealth: Payer: Self-pay | Admitting: Family Medicine

## 2024-03-18 VITALS — BP 128/84 | HR 77 | Ht 66.0 in | Wt 164.0 lb

## 2024-03-18 DIAGNOSIS — Z0000 Encounter for general adult medical examination without abnormal findings: Secondary | ICD-10-CM | POA: Diagnosis not present

## 2024-03-18 NOTE — Progress Notes (Signed)
 Subjective:   Paige Brown 1985/09/12  03/18/2024   CC: Chief Complaint  Patient presents with   Annual Exam   HPI: Paige Brown is a 38 y.o. female who presents for a routine health maintenance exam. Fasting labs collected prior to visit.  Diet- healthy  Exercise- very active   HEALTH SCREENINGS: - Vision Screening: plans to schedule - Dental Visits: up to date - Pap smear: up to date - Breast Exam: discussed SBEs - Mammogram (40+): Not applicable  - Colonoscopy (45+): Not applicable  - Bone Density (65+ or under 65 with predisposing conditions): Not applicable  - Lung CA screening with low-dose CT:  Not applicable Adults age 44-80 who are current cigarette smokers or quit within the last 15 years. Must have 20 pack year history.   Depression and Anxiety Screen done today and results listed below:     03/18/2024   10:38 AM 02/05/2024   10:58 AM 05/11/2020    8:43 AM 04/13/2020    4:34 PM 04/20/2018    9:04 AM  Depression screen PHQ 2/9  Decreased Interest 0 0 0 1   Down, Depressed, Hopeless 0 0 0 0   PHQ - 2 Score 0 0 0 1   Altered sleeping 0 1 0 0   Tired, decreased energy 0 1 0 2   Change in appetite 0 0 0 0   Feeling bad or failure about yourself  0 0 0 0   Trouble concentrating 0 0 0 0   Moving slowly or fidgety/restless 0 0 0 0   Suicidal thoughts 0 0 0 0   PHQ-9 Score 0 2  0  3    Difficult doing work/chores  Not difficult at all        Information is confidential and restricted. Go to Review Flowsheets to unlock data.   Data saved with a previous flowsheet row definition      03/18/2024   10:38 AM 02/05/2024   10:58 AM 04/13/2020    4:37 PM  GAD 7 : Generalized Anxiety Score  Nervous, Anxious, on Edge 0 0 0  Control/stop worrying 0 0 0  Worry too much - different things 0 0 0  Trouble relaxing 0 0 0  Restless 0 0 0  Easily annoyed or irritable 0 1 1  Afraid - awful might happen 0 0 0  Total GAD 7 Score 0 1 1  Anxiety  Difficulty  Not difficult at all    IMMUNIZATIONS: - Tdap: Tetanus vaccination status reviewed: last tetanus booster within 10 years. - HPV: Not applicable - Influenza: Up to date - Pneumovax: Not applicable - Prevnar 20: Not applicable - Zostavax (50+): Not applicable  Past medical history, surgical history, medications, allergies, family history and social history reviewed with patient today and changes made to appropriate areas of the chart.   Past Medical History:  Diagnosis Date   Allergy    Anemia    with pregnancies   GERD (gastroesophageal reflux disease)    during pregnancy   Miscarriage 06/24/2021   UTI (lower urinary tract infection) 11/09/2015   2 more days to complete ATB    Past Surgical History:  Procedure Laterality Date   BREAST SURGERY  1/24 & 10/24   Fat graft then impants   CESAREAN SECTION     COSMETIC SURGERY  05/2022   TONSILLECTOMY  05/02/1992   and adnoids   WISDOM TOOTH EXTRACTION     2000   No current  outpatient medications on file prior to visit.   No current facility-administered medications on file prior to visit.   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  Tobacco Use   Smoking status: Never   Smokeless tobacco: Never  Vaping Use   Vaping status: Never Used  Substance and Sexual Activity   Alcohol use: Yes    Comment: Rarelt   Drug use: No   Sexual activity: Yes    Partners: Male    Birth control/protection: Surgical    Comment: vasectomy  Other Topics Concern   Not on file  Social History  Narrative   Not on file   Social Drivers of Health   Financial Resource Strain: Low Risk  (02/05/2024)   Overall Financial Resource Strain (CARDIA)    Difficulty of Paying Living Expenses: Not hard at all  Food Insecurity: No Food Insecurity (02/05/2024)   Hunger Vital Sign    Worried About Running Out of Food in the Last Year: Never true    Ran Out of Food in the Last Year: Never true  Transportation Needs: No Transportation Needs (02/05/2024)   PRAPARE - Administrator, Civil Service (Medical): No    Lack of Transportation (Non-Medical): No  Physical Activity: Insufficiently Active (02/05/2024)   Exercise Vital Sign    Days of Exercise per Week: 3 days    Minutes of Exercise per Session: 40 min  Stress: No Stress Concern Present (02/05/2024)   [employer] of Occupational Health - Occupational Stress Questionnaire    Feeling of Stress: Not at all  Social Connections: Socially Integrated (02/05/2024)   Social Connection and Isolation Panel    Frequency of Communication with Friends and Family: More than three times a week    Frequency of Social Gatherings with Friends and Family: More than three times a week    Attends Religious Services: More than 4 times per year    Active Member of [employer] or Organizations: Yes    Attends Engineer, Structural: More than 4 times per year    Marital Status: Married  Catering Manager Violence: Not on file   Social History   Tobacco Use  Smoking Status Never  Smokeless Tobacco Never   Social History   Substance and Sexual Activity  Alcohol Use Yes   Comment: Rarelt    Family History  Problem Relation Age of Onset   Cancer Father        multiple Myeloma   Rheum arthritis Maternal Grandmother    Osteoarthritis Maternal Grandmother    Breast cancer Maternal Grandmother 60   Thyroid  disease Maternal Grandmother    Heart failure Maternal Grandfather    Hyperlipidemia Maternal Grandfather    Hypertension Maternal  Grandfather    Asthma Paternal Grandmother    Stroke Paternal Grandfather    Rheum arthritis Maternal Aunt    Rheum arthritis Maternal Uncle    Ovarian cancer Neg Hx    Colon cancer Neg Hx    ROS: Denies  fever, fatigue, unexplained weight loss/gain, chest pain, SHOB, and palpitations. Denies neurological deficits, gastrointestinal or genitourinary complaints, and skin changes.   Objective:   Today's Vitals   03/18/24 1037  BP: 128/84  Pulse: 77  SpO2: 98%  Weight: 164 lb (74.4 kg)  Height: 5' 6 (1.676 m)  PainSc: 0-No pain    GENERAL APPEARANCE: Well-appearing, in NAD. Well nourished.  SKIN: Pink, warm and dry. Turgor normal. No rash, lesion, ulceration, or ecchymoses. Hair evenly distributed.  HEENT: HEAD: Normocephalic.  EYES: PERRLA. EOMI. Lids intact w/o defect. Sclera white, Conjunctiva pink w/o exudate.  EARS: External ear w/o redness, swelling, masses or lesions. EAC clear. TM's intact, translucent w/o bulging, appropriate landmarks visualized. Appropriate acuity to conversational tones.  NOSE: Septum midline w/o deformity. Nares patent, mucosa pink and non-inflamed w/o drainage. No sinus tenderness.  THROAT: Uvula midline. Oropharynx clear. Tonsils non-inflamed w/o exudate. Oral mucosa pink and moist.  NECK: Supple, Trachea midline. Full ROM w/o pain or tenderness. No lymphadenopathy. Thyroid  non-tender w/o enlargement or palpable masses.  BREASTS: Deferred.  RESPIRATORY: Chest wall symmetrical w/o masses. Respirations even and non-labored. Breath sounds clear to auscultation bilaterally. No wheezes, rales, rhonchi, or crackles. CARDIAC: S1, S2 present, regular rate and rhythm. No gallops, murmurs, rubs, or clicks. PMI w/o lifts, heaves, or thrills. No carotid bruits. Capillary refill <2 seconds. Peripheral pulses 2+ bilaterally. GI: Abdomen soft w/o distention. Normoactive bowel sounds. No palpable masses or tenderness. No guarding or rebound tenderness. Liver and spleen  w/o tenderness or enlargement. No CVA tenderness.  GU: Deferred.  MSK: Muscle tone and strength appropriate for age, w/o atrophy or abnormal movement.  EXTREMITIES: Active ROM intact, w/o tenderness, crepitus, or contracture. No obvious joint deformities or effusions. No clubbing, edema, or cyanosis.  NEUROLOGIC: CN's II-XII intact. Motor strength symmetrical with no obvious weakness. No sensory deficits. DTR's 2+ symmetric bilaterally. Steady, even gait.  PSYCH/MENTAL STATUS: Alert, oriented x 3. Cooperative, appropriate mood and affect.   Results for orders placed or performed in visit on 02/05/24  CBC with Differential/Platelet   Collection Time: 02/05/24 11:29 AM  Result Value Ref Range   WBC 5.8 3.4 - 10.8 x10E3/uL   RBC 4.33 3.77 - 5.28 x10E6/uL   Hemoglobin 13.2 11.1 - 15.9 g/dL   Hematocrit 60.0 65.9 - 46.6 %   MCV 92 79 - 97 fL   MCH 30.5 26.6 - 33.0 pg   MCHC 33.1 31.5 - 35.7 g/dL   RDW 87.9 88.2 - 84.5 %   Platelets 173 150 - 450 x10E3/uL   Neutrophils 70 Not Estab. %   Lymphs 23 Not Estab. %   Monocytes 5 Not Estab. %   Eos 1 Not Estab. %   Basos 1 Not Estab. %   Neutrophils Absolute 4.0 1.4 - 7.0 x10E3/uL   Lymphocytes Absolute 1.4 0.7 - 3.1 x10E3/uL   Monocytes Absolute 0.3 0.1 - 0.9 x10E3/uL   EOS (ABSOLUTE) 0.1 0.0 - 0.4 x10E3/uL   Basophils Absolute 0.0 0.0 - 0.2 x10E3/uL   Immature Granulocytes 0 Not Estab. %   Immature Grans (Abs) 0.0 0.0 - 0.1 x10E3/uL  Comprehensive metabolic panel with GFR   Collection Time: 02/05/24 11:29 AM  Result Value Ref Range   Glucose 77 70 - 99 mg/dL   BUN 10 6 - 20 mg/dL   Creatinine, Ser 9.29 0.57 - 1.00 mg/dL   eGFR 884 >40 fO/fpw/8.26   BUN/Creatinine Ratio 14 9 - 23   Sodium 141 134 - 144 mmol/L  Potassium 3.7 3.5 - 5.2 mmol/L   Chloride 104 96 - 106 mmol/L   CO2 24 20 - 29 mmol/L   Calcium 9.2 8.7 - 10.2 mg/dL   Total Protein 6.7 6.0 - 8.5 g/dL   Albumin 4.5 3.9 - 4.9 g/dL   Globulin, Total 2.2 1.5 - 4.5 g/dL    Bilirubin Total 0.4 0.0 - 1.2 mg/dL   Alkaline Phosphatase 56 41 - 116 IU/L   AST 17 0 - 40 IU/L   ALT 13 0 - 32 IU/L  Hemoglobin A1c   Collection Time: 02/05/24 11:29 AM  Result Value Ref Range   Hgb A1c MFr Bld 4.5 (L) 4.8 - 5.6 %   Est. average glucose Bld gHb Est-mCnc 82 mg/dL  Lipid panel   Collection Time: 02/05/24 11:29 AM  Result Value Ref Range   Cholesterol, Total 140 100 - 199 mg/dL   Triglycerides 72 0 - 149 mg/dL   HDL 42 >60 mg/dL   VLDL Cholesterol Cal 14 5 - 40 mg/dL   LDL Chol Calc (NIH) 84 0 - 99 mg/dL   Chol/HDL Ratio 3.3 0.0 - 4.4 ratio  TSH Rfx on Abnormal to Free T4   Collection Time: 02/05/24 11:29 AM  Result Value Ref Range   TSH 0.620 0.450 - 4.500 uIU/mL  FSH/LH   Collection Time: 02/05/24 11:29 AM  Result Value Ref Range   LH 4.2 mIU/mL   FSH 2.9 mIU/mL  Estradiol   Collection Time: 02/05/24 11:29 AM  Result Value Ref Range   Estradiol 93.0 pg/mL  17-Hydroxyprogesterone   Collection Time: 02/05/24 11:29 AM  Result Value Ref Range   17-OH Progesterone LCMS 149 ng/dL  Testosterone, Free, Total, SHBG   Collection Time: 02/05/24 11:29 AM  Result Value Ref Range   Testosterone 6 (L) 8 - 60 ng/dL   Testosterone, Free 0.2 0.0 - 4.2 pg/mL   Sex Hormone Binding 75.8 24.6 - 122.0 nmol/L    Assessment & Plan:   1. Wellness examination (Primary) - Encouraged a healthy well-balanced diet. Patient may adjust caloric intake to maintain or achieve ideal body weight. May reduce intake of dietary saturated fat and total fat and have adequate dietary potassium and calcium preferably from fresh fruits, vegetables, and low-fat dairy products.   - Advised to avoid cigarette smoking. - Discussed with the patient that most people either abstain from alcohol or drink within safe limits (<=14/week and <=4 drinks/occasion for males, <=7/weeks and <= 3 drinks/occasion for females) and that the risk for alcohol disorders and other health effects rises proportionally with  the number of drinks per week and how often a drinker exceeds daily limits. - Discussed cessation/primary prevention of drug use and availability of treatment for abuse.  - Discussed sexually transmitted diseases, avoidance of unintended pregnancy and contraceptive alternatives. - Stressed the importance of regular exercise - Injury prevention: Discussed safety belts, safety helmets, smoke detector, smoking near bedding or upholstery.  - Dental health: Discussed importance of regular tooth brushing, flossing, and dental visits.  NEXT PREVENTATIVE PHYSICAL DUE IN 1 YEAR.  Return in about 1 year (around 03/18/2025) for Physical with fasting labs.  Patient to reach out to office if new, worrisome, or unresolved symptoms arise or if no improvement in patient's condition. Patient verbalized understanding and is agreeable to treatment plan. All questions answered to patient's satisfaction.   Evalene Arts, FNP

## 2024-03-18 NOTE — Patient Instructions (Signed)
 Health Maintenance Recommendations Screening Testing Mammogram Every 1 -2 years based on history and risk factors Starting at age 37 Please call to schedule: [facility]  [address], [place], [place] [address] Phone: [phone] [facility] [address], [place], [place] [address] Phone: [phone] Pap Smear Ages 21-39 every 3 years Ages 32-65 every 5 years with HPV testing More frequent testing may be required based on results and history Colon Cancer Screening Every 1-10 years based on test performed, risk factors, and history Starting at age 47 Bone Density Screening Every 2-10 years based on history Starting at age 34 for women Recommendations for men differ based on medication usage, history, and risk factors AAA Screening One time ultrasound Men 44-26 years old who have every smoked Lung Cancer Screening Low Dose Lung CT every 12 months Age 14-80 years with a 30 pack-year smoking history who still smoke or who have quit within the last 15 years   Screening Labs Routine  Labs: Complete Blood Count (CBC), Complete Metabolic Panel (CMP), Cholesterol (Lipid Panel) Every 6-12 months based on history and medications May be recommended more frequently based on current conditions or previous results Hemoglobin A1c Lab Every 3-12 months based on history and previous results Starting at age 82 or earlier with diagnosis of diabetes, high cholesterol, BMI >26, and/or risk factors Frequent monitoring for patients with diabetes to ensure blood sugar control Thyroid  Panel (TSH w/ T3 & T4) Every 6 months based on history, symptoms, and risk factors May be repeated more often if on medication HIV One time testing for all patients 76 and older May be repeated more frequently for patients with increased risk factors or exposure Hepatitis C One time testing for all patients 13 and older May be repeated more frequently for patients with increased risk factors or  exposure Gonorrhea, Chlamydia Every 12 months for all sexually active persons 13-24 years Additional monitoring may be recommended for those who are considered high risk or who have symptoms PSA Men 100-42 years old with risk factors Additional screening may be recommended from age 3-69 based on risk factors, symptoms, and history   Vaccine Recommendations Tetanus Booster All adults every 10 years Flu Vaccine All patients 6 months and older every year COVID Vaccine All patients 12 years and older Initial dosing with booster May recommend additional booster based on age and health history HPV Vaccine 2 doses all patients age 53-26 Dosing may be considered for patients over 26 Shingles Vaccine (Shingrix) 2 doses all adults 55 years and older Pneumonia (Pneumovax 87) All adults 65 years and older May recommend earlier dosing based on health history Pneumonia (Prevnar 47) All adults 65 years and older Dosed 1 year after Pneumovax 23   Additional Screening, Testing, and Vaccinations may be recommended on an individualized basis based on family history, health history, risk factors, and/or exposure.  __________________________________________________________   Diet Recommendations for All Patients   I recommend that all patients maintain a diet low in saturated fats, carbohydrates, and cholesterol. While this can be challenging at first, it is not impossible and small changes can make big differences.  Things to try: Decreasing the amount of soda, sweet tea, and/or juice to one or less per day and replace with water  While water  is always the first choice, if you do not like water  you may consider adding a water  additive without sugar to improve the taste other sugar free drinks Replace potatoes with a brightly colored vegetable at dinner  Use healthy oils, such as canola oil or olive oil, instead of butter or hard margarine Limit your bread intake to two pieces or less a  day Replace regular pasta with low carb pasta options Bake, broil, or grill foods instead of frying Monitor portion sizes  Eat smaller, more frequent meals throughout the day instead of large meals   An important thing to remember is, if you love foods that are not great for your health, you don't have to give them up completely. Instead, allow these foods to be a reward when you have done well. Allowing yourself to still have special treats every once in a while is a nice way to tell yourself thank you for working hard to keep yourself healthy.    Also remember that every day is a new day. If you have a bad day and fall off the wagon, you can still climb right back up and keep moving along on your journey!   We have resources available to help you!  Some websites that may be helpful include: www.http://www.wall-moore.info/        Www.VeryWellFit.com _____________________________________________________________   Activity Recommendations for All Patients   I recommend that all adults get at least 20 minutes of moderate physical activity that elevates your heart rate at least 5 days out of the week.  Some examples include: Walking or jogging at a pace that allows you to carry on a conversation Cycling (stationary bike or outdoors) Water  aerobics Yoga Weight lifting Dancing If physical limitations prevent you from putting stress on your joints, exercise in a pool or seated in a chair are excellent options.   Do determine your MAXIMUM heart rate for activity: YOUR AGE - 220 = MAX HeartRate    Remember! Do not push yourself too hard.  Start slowly and build up your pace, speed, weight, time in exercise, etc.  Allow your body to rest between exercise and get good sleep. You will need more water  than normal when you are exerting yourself. Do not wait until you are thirsty to drink. Drink with a purpose of getting in at least 8, 8 ounce glasses of water  a day plus more depending on how much you exercise  and sweat.      If you begin to develop dizziness, chest pain, abdominal pain, jaw pain, shortness of breath, headache, vision changes, lightheadedness, or other concerning symptoms, stop the activity and allow your body to rest. If your symptoms are severe, seek emergency evaluation immediately. If your symptoms are concerning, but not severe, please let us  know so that we can recommend further evaluation.

## 2024-03-18 NOTE — Telephone Encounter (Signed)
 Patient declined to schedule for 2026 physical and fasting will call back in 6 months
